# Patient Record
Sex: Male | Born: 1935 | Race: Black or African American | Hispanic: No | Smoking: Former smoker
Health system: Southern US, Community
[De-identification: ages and names within clinical notes are randomized; demographics above are authoritative.]

## PROBLEM LIST (undated history)

## (undated) DIAGNOSIS — E785 Hyperlipidemia, unspecified: Secondary | ICD-10-CM

## (undated) DIAGNOSIS — C61 Malignant neoplasm of prostate: Secondary | ICD-10-CM

## (undated) DIAGNOSIS — G709 Myoneural disorder, unspecified: Secondary | ICD-10-CM

## (undated) DIAGNOSIS — H409 Unspecified glaucoma: Secondary | ICD-10-CM

## (undated) DIAGNOSIS — I10 Essential (primary) hypertension: Secondary | ICD-10-CM

## (undated) DIAGNOSIS — K219 Gastro-esophageal reflux disease without esophagitis: Secondary | ICD-10-CM

## (undated) DIAGNOSIS — Z8619 Personal history of other infectious and parasitic diseases: Secondary | ICD-10-CM

## (undated) DIAGNOSIS — K5790 Diverticulosis of intestine, part unspecified, without perforation or abscess without bleeding: Secondary | ICD-10-CM

## (undated) DIAGNOSIS — M1990 Unspecified osteoarthritis, unspecified site: Secondary | ICD-10-CM

## (undated) HISTORY — PX: JOINT REPLACEMENT: SHX530

## (undated) HISTORY — DX: Unspecified osteoarthritis, unspecified site: M19.90

## (undated) HISTORY — PX: TONSILLECTOMY: SUR1361

## (undated) HISTORY — DX: Myoneural disorder, unspecified: G70.9

## (undated) HISTORY — DX: Hyperlipidemia, unspecified: E78.5

## (undated) HISTORY — DX: Diverticulosis of intestine, part unspecified, without perforation or abscess without bleeding: K57.90

## (undated) HISTORY — PX: COLONOSCOPY: SHX174

---

## 2007-04-12 DIAGNOSIS — K5790 Diverticulosis of intestine, part unspecified, without perforation or abscess without bleeding: Secondary | ICD-10-CM

## 2007-04-12 HISTORY — DX: Diverticulosis of intestine, part unspecified, without perforation or abscess without bleeding: K57.90

## 2008-01-10 ENCOUNTER — Encounter: Payer: Self-pay | Admitting: Internal Medicine

## 2008-01-17 ENCOUNTER — Ambulatory Visit: Payer: Self-pay | Admitting: Internal Medicine

## 2008-01-17 DIAGNOSIS — E7800 Pure hypercholesterolemia, unspecified: Secondary | ICD-10-CM | POA: Insufficient documentation

## 2008-01-17 DIAGNOSIS — M1990 Unspecified osteoarthritis, unspecified site: Secondary | ICD-10-CM | POA: Insufficient documentation

## 2008-01-17 DIAGNOSIS — E1151 Type 2 diabetes mellitus with diabetic peripheral angiopathy without gangrene: Secondary | ICD-10-CM | POA: Insufficient documentation

## 2008-01-17 DIAGNOSIS — R972 Elevated prostate specific antigen [PSA]: Secondary | ICD-10-CM | POA: Insufficient documentation

## 2008-01-17 DIAGNOSIS — I10 Essential (primary) hypertension: Secondary | ICD-10-CM | POA: Insufficient documentation

## 2008-01-17 LAB — CONVERTED CEMR LAB
Alkaline Phosphatase: 65 units/L (ref 39–117)
Bilirubin, Direct: 0.1 mg/dL (ref 0.0–0.3)
CO2: 29 meq/L (ref 19–32)
Crystals: NEGATIVE
Eosinophils Relative: 2.4 % (ref 0.0–5.0)
GFR calc Af Amer: 107 mL/min
Glucose, Bld: 139 mg/dL — ABNORMAL HIGH (ref 70–99)
Microalb Creat Ratio: 5.1 mg/g (ref 0.0–30.0)
Monocytes Relative: 10.9 % (ref 3.0–12.0)
Neutrophils Relative %: 60.4 % (ref 43.0–77.0)
Platelets: 331 10*3/uL (ref 150–400)
Potassium: 4.2 meq/L (ref 3.5–5.1)
Sodium: 140 meq/L (ref 135–145)
Specific Gravity, Urine: >=1.03 (ref 1.000–1.03)
TSH: 1.66 microintl units/mL (ref 0.35–5.50)
Total Protein, Urine: NEGATIVE mg/dL
Total Protein: 6.6 g/dL (ref 6.0–8.3)
Urine Glucose: 100 mg/dL — AB
WBC: 4.5 10*3/uL (ref 4.5–10.5)
pH: 5 (ref 5.0–8.0)

## 2008-01-21 ENCOUNTER — Encounter: Payer: Self-pay | Admitting: Internal Medicine

## 2008-01-24 ENCOUNTER — Encounter: Payer: Self-pay | Admitting: Internal Medicine

## 2008-02-04 ENCOUNTER — Ambulatory Visit: Payer: Self-pay | Admitting: Gastroenterology

## 2008-02-05 ENCOUNTER — Telehealth (INDEPENDENT_AMBULATORY_CARE_PROVIDER_SITE_OTHER): Payer: Self-pay | Admitting: *Deleted

## 2008-03-13 ENCOUNTER — Ambulatory Visit: Payer: Self-pay | Admitting: Gastroenterology

## 2008-03-17 ENCOUNTER — Ambulatory Visit: Payer: Self-pay | Admitting: Gastroenterology

## 2008-05-26 ENCOUNTER — Encounter: Admission: RE | Admit: 2008-05-26 | Discharge: 2008-05-26 | Payer: Self-pay | Admitting: Internal Medicine

## 2008-08-04 ENCOUNTER — Encounter: Payer: Self-pay | Admitting: Internal Medicine

## 2008-09-30 ENCOUNTER — Encounter: Payer: Self-pay | Admitting: Internal Medicine

## 2008-10-07 ENCOUNTER — Encounter: Payer: Self-pay | Admitting: Internal Medicine

## 2009-01-16 ENCOUNTER — Ambulatory Visit: Payer: Self-pay | Admitting: Internal Medicine

## 2009-01-16 DIAGNOSIS — R079 Chest pain, unspecified: Secondary | ICD-10-CM

## 2009-01-16 LAB — CONVERTED CEMR LAB
ALT: 15 units/L (ref 0–53)
Albumin: 4.2 g/dL (ref 3.5–5.2)
Basophils Relative: 0.6 % (ref 0.0–3.0)
Bilirubin Urine: NEGATIVE
CO2: 22 meq/L (ref 19–32)
Chloride: 102 meq/L (ref 96–112)
Cholesterol, target level: 200 mg/dL
Creatinine, Ser: 1 mg/dL (ref 0.4–1.5)
Eosinophils Absolute: 0.2 10*3/uL (ref 0.0–0.7)
HCT: 39 % (ref 39.0–52.0)
Hemoglobin: 13.3 g/dL (ref 13.0–17.0)
Hgb A1c MFr Bld: 6.7 % — ABNORMAL HIGH (ref 4.6–6.5)
Ketones, ur: NEGATIVE mg/dL
LDL Goal: 100 mg/dL
Leukocytes, UA: NEGATIVE
MCHC: 34 g/dL (ref 30.0–36.0)
MCV: 98.3 fL (ref 78.0–100.0)
Monocytes Absolute: 0.4 10*3/uL (ref 0.1–1.0)
Neutro Abs: 1.6 10*3/uL (ref 1.4–7.7)
Nitrite: NEGATIVE
Potassium: 4.8 meq/L (ref 3.5–5.1)
RBC: 3.97 M/uL — ABNORMAL LOW (ref 4.22–5.81)
Sodium: 139 meq/L (ref 135–145)
Total CHOL/HDL Ratio: 4
Total Protein, Urine: NEGATIVE mg/dL
Total Protein: 6.4 g/dL (ref 6.0–8.3)
Triglycerides: 71 mg/dL (ref 0.0–149.0)

## 2009-01-19 ENCOUNTER — Telehealth (INDEPENDENT_AMBULATORY_CARE_PROVIDER_SITE_OTHER): Payer: Self-pay | Admitting: *Deleted

## 2009-01-20 ENCOUNTER — Ambulatory Visit: Payer: Self-pay

## 2009-01-20 ENCOUNTER — Encounter (HOSPITAL_COMMUNITY): Admission: RE | Admit: 2009-01-20 | Discharge: 2009-01-20 | Payer: Self-pay | Admitting: Internal Medicine

## 2009-01-20 ENCOUNTER — Ambulatory Visit: Payer: Self-pay | Admitting: Cardiology

## 2009-01-20 DIAGNOSIS — R9430 Abnormal result of cardiovascular function study, unspecified: Secondary | ICD-10-CM | POA: Insufficient documentation

## 2009-01-21 ENCOUNTER — Telehealth: Payer: Self-pay | Admitting: Internal Medicine

## 2009-01-21 ENCOUNTER — Encounter: Payer: Self-pay | Admitting: Internal Medicine

## 2009-01-27 ENCOUNTER — Ambulatory Visit: Payer: Self-pay | Admitting: Cardiology

## 2009-01-27 DIAGNOSIS — R9431 Abnormal electrocardiogram [ECG] [EKG]: Secondary | ICD-10-CM

## 2009-02-04 ENCOUNTER — Ambulatory Visit: Payer: Self-pay | Admitting: Cardiology

## 2009-02-04 ENCOUNTER — Ambulatory Visit: Payer: Self-pay

## 2009-02-04 ENCOUNTER — Ambulatory Visit (HOSPITAL_COMMUNITY): Admission: RE | Admit: 2009-02-04 | Discharge: 2009-02-04 | Payer: Self-pay | Admitting: Cardiology

## 2009-02-04 ENCOUNTER — Encounter: Payer: Self-pay | Admitting: Cardiology

## 2009-02-06 ENCOUNTER — Telehealth: Payer: Self-pay | Admitting: Internal Medicine

## 2009-04-08 ENCOUNTER — Telehealth: Payer: Self-pay | Admitting: Internal Medicine

## 2009-04-22 ENCOUNTER — Telehealth: Payer: Self-pay | Admitting: Internal Medicine

## 2009-04-23 ENCOUNTER — Telehealth: Payer: Self-pay | Admitting: Internal Medicine

## 2009-05-01 ENCOUNTER — Encounter: Payer: Self-pay | Admitting: Internal Medicine

## 2009-05-05 ENCOUNTER — Telehealth (INDEPENDENT_AMBULATORY_CARE_PROVIDER_SITE_OTHER): Payer: Self-pay | Admitting: *Deleted

## 2009-05-06 ENCOUNTER — Telehealth: Payer: Self-pay | Admitting: Internal Medicine

## 2009-05-07 ENCOUNTER — Encounter: Payer: Self-pay | Admitting: Internal Medicine

## 2009-06-09 ENCOUNTER — Encounter: Payer: Self-pay | Admitting: Internal Medicine

## 2010-02-11 ENCOUNTER — Telehealth: Payer: Self-pay | Admitting: Internal Medicine

## 2010-02-12 ENCOUNTER — Ambulatory Visit: Payer: Self-pay | Admitting: Internal Medicine

## 2010-02-12 LAB — CONVERTED CEMR LAB
ALT: 23 units/L (ref 0–53)
BUN: 11 mg/dL (ref 6–23)
Basophils Absolute: 0 10*3/uL (ref 0.0–0.1)
Bilirubin Urine: NEGATIVE
Bilirubin, Direct: 0 mg/dL (ref 0.0–0.3)
Chloride: 105 meq/L (ref 96–112)
Cholesterol: 117 mg/dL (ref 0–200)
Creatinine, Ser: 0.9 mg/dL (ref 0.4–1.5)
Creatinine,U: 275.2 mg/dL
Eosinophils Absolute: 0.1 10*3/uL (ref 0.0–0.7)
Eosinophils Relative: 2.9 % (ref 0.0–5.0)
Glucose, Bld: 137 mg/dL — ABNORMAL HIGH (ref 70–99)
HCT: 34.5 % — ABNORMAL LOW (ref 39.0–52.0)
Hgb A1c MFr Bld: 6.7 % — ABNORMAL HIGH (ref 4.6–6.5)
Ketones, ur: NEGATIVE mg/dL
LDL Cholesterol: 68 mg/dL (ref 0–99)
Leukocytes, UA: NEGATIVE
Lymphs Abs: 1.5 10*3/uL (ref 0.7–4.0)
MCHC: 33.6 g/dL (ref 30.0–36.0)
MCV: 97.4 fL (ref 78.0–100.0)
Microalb Creat Ratio: 0.5 mg/g (ref 0.0–30.0)
Microalb, Ur: 1.4 mg/dL (ref 0.0–1.9)
Monocytes Absolute: 0.4 10*3/uL (ref 0.1–1.0)
Neutrophils Relative %: 48.8 % (ref 43.0–77.0)
PSA: 3.51 ng/mL (ref 0.10–4.00)
Platelets: 284 10*3/uL (ref 150.0–400.0)
Potassium: 4.4 meq/L (ref 3.5–5.1)
RDW: 14.6 % (ref 11.5–14.6)
Specific Gravity, Urine: >=1.03 (ref 1.000–1.030)
TSH: 1.82 microintl units/mL (ref 0.35–5.50)
Total Bilirubin: 0.8 mg/dL (ref 0.3–1.2)
Triglycerides: 59 mg/dL (ref 0.0–149.0)
Urine Glucose: NEGATIVE mg/dL
Urobilinogen, UA: 0.2 (ref 0.0–1.0)
WBC: 4.2 10*3/uL — ABNORMAL LOW (ref 4.5–10.5)

## 2010-02-18 ENCOUNTER — Ambulatory Visit: Payer: Self-pay | Admitting: Cardiology

## 2010-02-25 ENCOUNTER — Telehealth (INDEPENDENT_AMBULATORY_CARE_PROVIDER_SITE_OTHER): Payer: Self-pay

## 2010-03-01 ENCOUNTER — Encounter: Payer: Self-pay | Admitting: Cardiovascular Disease

## 2010-03-01 ENCOUNTER — Encounter (HOSPITAL_COMMUNITY)
Admission: RE | Admit: 2010-03-01 | Discharge: 2010-05-11 | Payer: Self-pay | Source: Home / Self Care | Attending: Cardiology | Admitting: Cardiology

## 2010-03-01 ENCOUNTER — Ambulatory Visit: Payer: Self-pay | Admitting: Cardiovascular Disease

## 2010-03-01 ENCOUNTER — Ambulatory Visit: Payer: Self-pay

## 2010-03-09 ENCOUNTER — Encounter: Payer: Self-pay | Admitting: Internal Medicine

## 2010-04-02 ENCOUNTER — Encounter: Payer: Self-pay | Admitting: Internal Medicine

## 2010-05-04 ENCOUNTER — Telehealth (INDEPENDENT_AMBULATORY_CARE_PROVIDER_SITE_OTHER): Payer: Self-pay | Admitting: *Deleted

## 2010-05-11 NOTE — Assessment & Plan Note (Signed)
Summary: F1Y/ABN EKG NONSPECIFIC/JML   Visit Type:  Follow-up Primary Provider:  Sanda Linger, MD  CC:  Abnormal Cardiovascular Study.  History of Present Illness: The patient is referred back for evaluation of an abnormal cardiovascular function study. This was done last year to evaluate chest pain in the base of long-standing diabetes. There was a questionable inferoapical infarct with an EF of 47%. Followup echo suggested the EF to be 60% with questionable lateral wall hypokinesis. The patient is referred back to further evaluate this. He does get some dyspnea with exertion such as when he pushes a lawnmower. He will have to stop what he is doing and recover. He does not get any chest pressure, neck or arm discomfort. He doesn't have any resting shortness of breath and does not describe PND or orthopnea. He does not have palpitations, presyncope or syncope.  I did review lipids at his last LDL was 68 HDL 37. His hemoglobin A1c was 6.7.  Current Medications (verified): 1)  Simvastatin 40 Mg Tabs (Simvastatin) .... Once Daily 2)  Glyburide 2.5 Mg Tabs (Glyburide) .... Qdtab 3)  Metformin Hcl 850 Mg Tabs (Metformin Hcl) .... Take 1 Tablet By Mouth Two Times A Day 4)  Celebrex 200 Mg Caps (Celecoxib) .... Once Daily As Needed 5)  Contour Blood Glucose System W/device Kit (Blood Glucose Monitoring Suppl) .... As Directed 6)  Altace 2.5 Mg Tabs (Ramipril) .... Once Daily For Hypertension  Allergies (verified): No Known Drug Allergies  Past History:  Past Medical History: Reviewed history from 01/27/2009 and no changes required. Diabetes mellitus, type II since 1995 Hyperlipidemia x 5 years Osteoarthritis  Past Surgical History: Reviewed history from 01/27/2009 and no changes required. None  Review of Systems       As stated in the HPI and negative for all other systems.   Vital Signs:  Patient profile:   76 year old male Height:      65 inches Weight:      176 pounds BMI:      29.39 Pulse rate:   68 / minute Resp:     18 per minute BP sitting:   158 / 86  (right arm)  Vitals Entered By: Marrion Coy, CNA (February 18, 2010 9:44 AM)  Physical Exam  General:  Well developed, well nourished, in no acute distress. Head:  normocephalic and atraumatic Eyes:  PERRLA/EOM intact; conjunctiva and lids normal. Mouth:  Upper dentures. Oral mucosa normal. Neck:  Neck supple, no JVD. No masses, thyromegaly or abnormal cervical nodes. Chest Wall:  no deformities or breast masses noted Lungs:  Clear bilaterally to auscultation and percussion. Abdomen:  Bowel sounds positive; abdomen soft and non-tender without masses, organomegaly, or hernias noted. No hepatosplenomegaly. Msk:  Back normal, normal gait. Muscle strength and tone normal. Extremities:  No clubbing or cyanosis. Neurologic:  Alert and oriented x 3. Skin:  Intact without lesions or rashes. Cervical Nodes:  no significant adenopathy Axillary Nodes:  no significant adenopathy Inguinal Nodes:  no significant adenopathy Psych:  Normal affect.   Detailed Cardiovascular Exam  Neck    Carotids: Carotids full and equal bilaterally without bruits.      Neck Veins: Normal, no JVD.    Heart    Inspection: no deformities or lifts noted.      Palpation: normal PMI with no thrills palpable.      Auscultation: regular rate and rhythm, S1, S2 without murmurs, rubs, gallops, or clicks.    Vascular    Abdominal Aorta:  no palpable masses, pulsations, or audible bruits.      Femoral Pulses: normal femoral pulses bilaterally.      Pedal Pulses: normal pedal pulses bilaterally.      Radial Pulses: normal radial pulses bilaterally.      Peripheral Circulation: no clubbing, cyanosis, or edema noted with normal capillary refill.     EKG  Procedure date:  02/18/2010  Findings:      sinus rhythm, rate 70, axis within normal limits, intervals within normal limits, premature atrial contraction, no acute ST-T wave  changes.  Impression & Recommendations:  Problem # 1:  CARDIOVASCULAR FUNCTION STUDY, ABNORMAL (ICD-794.30) The patient does have some dyspnea with exertion.  In the face of significant risk factors and a previous abnormal functional study it is prudent to reevaluate again with a stress perfusion study. He will continue primary risk reduction pending these results. 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 He is elevated but previous blood pressure readings have not been. He said he was excited about getting here. This should be followed and meds adjusted as needed but today I did not make any med changes.  Problem # 3:  PURE HYPERCHOLESTEROLEMIA (ICD-272.0) His lipid profile is reasonable and he will continue the meds as listed.  Patient Instructions: 1)  Your physician recommends that you schedule a follow-up appointment after stress testing if needed 2)  Your physician recommends that you continue on your current medications as directed. Please refer to the Current Medication list given to you today. 3)  Your physician has requested that you have an exercise stress myoview.  For further information please visit [email or web address]  Please follow instruction sheet, as given.

## 2010-05-11 NOTE — Assessment & Plan Note (Signed)
Summary: Cardiology Nuclear Testing  Nuclear Med Background Indications for Stress Test: Evaluation for Ischemia  Indications Comments: Evaluation due  to previous abnormal nuclear study.  History: Echo, Myocardial Perfusion Study  History Comments: '10 [id number] infero-apical scar, EF=47%; '10 Echo:EF=60%.  Symptoms: DOE    Nuclear Pre-Procedure Cardiac Risk Factors: Family History - CAD, History of Smoking, Hypertension, Lipids, NIDDM Caffeine/Decaff Intake: none NPO After: 10:00 PM Lungs: Clear IV 0.9% NS with Angio Cath: 22g     IV Site: R Hand IV Started by: Cathlyn Parsons, RN Chest Size (in) 42     Height (in): 65 Weight (lb): 176 BMI: 29.39  Nuclear Med Study 1 or 2 day study:  1 day     Stress Test Type:  Stress Reading MD:  Charlton Haws, MD     Referring MD:  Rollene Rotunda, MD Resting Radionuclide:  Technetium 81m Tetrofosmin     Resting Radionuclide Dose:  11 mCi  Stress Radionuclide:  Technetium 17m Tetrofosmin     Stress Radionuclide Dose:  33 mCi   Stress Protocol Exercise Time (min):  7:00 min     Max HR:  139 bpm     Predicted Max HR:  147 bpm  Max Systolic BP: 217 mm Hg     Percent Max HR:  94.56 %     METS: 7.0 Rate Pressure Product:  56213    Stress Test Technologist:  [employer]a [employer], CMA-N     Nuclear Technologist:  Domenic Polite, CNMT  Rest Procedure  Myocardial perfusion imaging was performed at rest 45 minutes following the intravenous administration of Technetium 24m Tetrofosmin.  Stress Procedure  The patient exercised for seven minutes.  The patient stopped due to fatigue and denied any chest pain.  There were nonspecific ST-T wave changes.  There were also occasional PAC's, PVC's with couplets and a short burst of SVT.   He had a mild hypertensive response to exercise, 217/69.  Technetium 59m Tetrofosmin was injected at peak exercise and myocardial perfusion imaging was performed after a brief delay.  QPS Raw Data Images:  Normal; no  motion artifact; normal heart/lung ratio. Stress Images:  Decrease inferior Rest Images:  Decrease inferior Subtraction (SDS):  SDS 5 abnormal at apex Transient Ischemic Dilatation:  0.92  (Normal <1.22)  Lung/Heart Ratio:  0.38  (Normal <0.45)  Quantitative Gated Spect Images QGS EDV:  124 ml QGS ESV:  55 ml QGS EF:  56 % QGS cine images:  normal  Findings Low risk nuclear study Evidence for anterior (septal apical) ischemia  Evidence for inferior infarct     Overall Impression  Exercise Capacity: Fair exercise capacity. BP Response: Hypertensive blood pressure response. Clinical Symptoms: No chest pain ECG Impression: No significant ST segment change suggestive of ischemia. 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  No further studies indicated.  Continue with current medications.  Appended Document: Cardiology Nuclear Testing pt aware of results

## 2010-05-11 NOTE — Letter (Signed)
Summary: [facility] Diabetic shoes/[facility]  [facility] Diabetic shoes/[facility]   Imported By: Lester [place] 05/05/2009 09:07:36  _____________________________________________________________________  External Attachment:    Type:   Image     Comment:   External Document

## 2010-05-11 NOTE — Letter (Signed)
Summary: [facility] for [facility]abetic Shoes/[facility]  [facility] for [facility]   Imported By: Sherian Rein 03/12/2010 10:19:06  _____________________________________________________________________  External Attachment:    Type:   Image     Comment:   External Document

## 2010-05-11 NOTE — Assessment & Plan Note (Signed)
Summary: FOLLOW-UP-LB   Vital Signs:  Patient profile:   76 year old male Height:      65 inches Weight:      178.75 pounds BMI:     29.85 O2 Sat:      98 % on Room air Temp:     97.9 degrees F oral Pulse rate:   63 / minute Pulse rhythm:   regular Resp:     16 per minute BP sitting:   130 / 70  (left arm) Cuff size:   large  Vitals Entered By: Rock Nephew CMA (February 12, 2010 8:18 AM)  Nutrition Counseling: Patient's BMI is greater than 25 and therefore counseled on weight management options.  O2 Flow:  Room air CC: follow-up visit//med refills, Preventive Care, Hypertension Management, Lipid Management Is Patient Diabetic? Yes Did you bring your meter with you today? No Pain Assessment Patient in pain? no       Does patient need assistance? Functional Status Self care Ambulation Normal   Primary Care Telisha Zawadzki:  Sanda Linger, MD  CC:  follow-up visit//med refills, Preventive Care, Hypertension Management, and Lipid Management.  History of Present Illness: Here for Medicare AWV:  1.   Risk factors based on Past M, S, F history: yes 2.   Physical Activities: very good. he travels a lot. 3.   Depression/mood: mood is very good 4.   Hearing: he hears a whispered voice at 3 feet 5.   ADL's: thorough and independent 6.   Fall Risk: none noted 7.   Home Safety: very good 8.   Height, weight, &visual acuity: done 9.   Counseling: yes 10.   Labs ordered based on risk factors: done 11.           Referral Coordination: done 12.           Care Plan: completed 13.           Cognitive Assessment : he answers all questions appropriately  Hypertension History:      He denies headache, chest pain, palpitations, dyspnea with exertion, orthopnea, PND, peripheral edema, visual symptoms, neurologic problems, syncope, and side effects from treatment.  He notes no problems with any antihypertensive medication side effects.        Positive major cardiovascular risk factors  include male age 75 years old or older, diabetes, hyperlipidemia, and hypertension.  Negative major cardiovascular risk factors include negative family history for ischemic heart disease and non-tobacco-user status.        Further assessment for target organ damage reveals no history of ASHD, cardiac end-organ damage (CHF/LVH), stroke/TIA, peripheral vascular disease, renal insufficiency, or hypertensive retinopathy.    Lipid Management History:      Positive NCEP/ATP III risk factors include male age 52 years old or older, diabetes, HDL cholesterol less than 40, and hypertension.  Negative NCEP/ATP III risk factors include no family history for ischemic heart disease, non-tobacco-user status, no ASHD (atherosclerotic heart disease), no prior stroke/TIA, no peripheral vascular disease, and no history of aortic aneurysm.        The patient states that he knows about the "Therapeutic Lifestyle Change" diet.  His compliance with the TLC diet is fair.  The patient expresses understanding of adjunctive measures for cholesterol lowering.  Adjunctive measures started by the patient include aerobic exercise, fiber, limit alcohol consumpton, and weight reduction.  He expresses no side effects from his lipid-lowering medication.  The patient denies any symptoms to suggest myopathy or liver disease.  Current Medications (verified): 1)  Simvastatin 40 Mg Tabs (Simvastatin) .... Once Daily 2)  Glyburide 2.5 Mg Tabs (Glyburide) .... Qdtab 3)  Metformin Hcl 850 Mg Tabs (Metformin Hcl) .... Take 1 Tablet By Mouth Two Times A Day 4)  Avandia 8 Mg Tabs (Rosiglitazone Maleate) .... Take 1 Tablet By Mouth Once A Day 5)  Celebrex 200 Mg Caps (Celecoxib) .... Once Daily As Needed 6)  Contour Blood Glucose System W/device Kit (Blood Glucose Monitoring Suppl) .... As Directed 7)  Altace 2.5 Mg Tabs (Ramipril) .... Once Daily For Hypertension  Allergies (verified): No Known Drug Allergies  Past History:  Past  Medical History: Last updated: 01/27/2009 Diabetes mellitus, type II since 1995 Hyperlipidemia x 5 years Osteoarthritis  Past Surgical History: Last updated: 01/27/2009 None  Family History: Last updated: 01/27/2009 Family History of Arthritis Family History Diabetes 1st degree relative Family History of Prostate CA 1st degree relative <50 No FH of Colon Cancer: One brother with questionable heart disease  Social History: Last updated: 01/27/2009 Retired Widower Smoked 4 -5 years (cigars) quit 15 years ago Alcohol use-no Drug use-no Regular exercise-yes Daily Caffeine Use  Risk Factors: Caffeine Use: 2 (02/04/2008) Exercise: yes (01/17/2008)  Risk Factors: Smoking Status: never (01/17/2008) Passive Smoke Exposure: no (01/17/2008)  Family History: Reviewed history from 01/27/2009 and no changes required. Family History of Arthritis Family History Diabetes 1st degree relative Family History of Prostate CA 1st degree relative <50 No FH of Colon Cancer: One brother with questionable heart disease  Social History: Reviewed history from 01/27/2009 and no changes required. Retired Widower Smoked 4 -5 years (cigars) quit 15 years ago Alcohol use-no Drug use-no Regular exercise-yes Daily Caffeine Use  Review of Systems  The patient denies anorexia, fever, weight loss, weight gain, chest pain, syncope, dyspnea on exertion, peripheral edema, prolonged cough, headaches, hemoptysis, abdominal pain, melena, hematochezia, severe indigestion/heartburn, hematuria, suspicious skin lesions, transient blindness, enlarged lymph nodes, angioedema, and testicular masses.   GU:  Complains of urinary frequency; denies decreased libido, discharge, dysuria, erectile dysfunction, hematuria, incontinence, nocturia, and urinary hesitancy. Endo:  Denies cold intolerance, excessive hunger, excessive thirst, excessive urination, heat intolerance, polyuria, and weight change.  Physical  Exam  General:  Well developed, well nourished, no acute distress.healthy appearing.   Head:  normocephalic and atraumatic.   Eyes:  No icterus Mouth:  good dentition and no gingival abnormalities.   Neck:  supple, full ROM, no masses, no carotid bruits, no cervical lymphadenopathy, and no neck tenderness.   Lungs:  Clear throughout to auscultation. Heart:  Regular rate and rhythm; no murmurs, rubs,  or bruits. Abdomen:  soft, non-tender, normal bowel sounds, no distention, no masses, no guarding, no rigidity, no rebound tenderness, no abdominal hernia, no inguinal hernia, no hepatomegaly, and no splenomegaly.   Rectal:  No external abnormalities noted. Normal sphincter tone. No rectal masses or tenderness. Genitalia:  uncircumcised, no hydrocele, no varicocele, no scrotal masses, no testicular masses or atrophy, no cutaneous lesions, and no urethral discharge.   Prostate:  no nodules, no induration, 2+ enlarged, and R asymmetrical enlargement.   Msk:  normal ROM, no joint tenderness, no joint swelling, no joint warmth, no redness over joints, no joint deformities, no joint instability, no crepitation, and no muscle atrophy.   Pulses:  R and L carotid,radial,femoral,dorsalis pedis and posterior tibial pulses are full and equal bilaterally Extremities:  No clubbing, cyanosis, edema, or deformity noted with normal full range of motion of all joints.   Neurologic:  No cranial nerve deficits noted. Station and gait are normal. Plantar reflexes are down-going bilaterally. DTRs are symmetrical throughout. Sensory, motor and coordinative functions appear intact.  Diabetes Management Exam:    Foot Exam (with socks and/or shoes not present):       Sensory-Pinprick/Light touch:          Left medial foot (L-4): normal          Left dorsal foot (L-5): normal          Left lateral foot (S-1): normal          Right medial foot (L-4): normal          Right dorsal foot (L-5): normal          Right lateral  foot (S-1): normal       Sensory-Monofilament:          Left foot: normal          Right foot: normal       Inspection:          Left foot: normal          Right foot: normal       Nails:          Left foot: normal          Right foot: normal   Impression & Recommendations:  Problem # 1:  ROUTINE GENERAL MEDICAL EXAM@HEALTH  CARE FACL (ICD-V70.0) Assessment New  Colonoscopy: Location:  [facility].   (03/17/2008) Td Booster: Td (10/16/2007)   Flu Vax: Fluvax MCR (01/27/2010)   Pneumovax: Pneumovax (12/19/2007) Chol: 164 (01/16/2009)   HDL: 38.40 (01/16/2009)   LDL: 111 (01/16/2009)   TG: 71.0 (01/16/2009) TSH: 2.24 (01/16/2009)   HgbA1C: 6.7 (01/16/2009)   Next Colonoscopy due:: 03/2018 (03/17/2008)  Discussed using sunscreen, use of alcohol, drug use, self testicular exam, routine dental care, routine eye care, routine physical exam, seat belts, multiple vitamins, osteoporosis prevention, adequate calcium intake in diet, and recommendations for immunizations.  Discussed exercise and checking cholesterol.  Also recommend checking PSA.  Orders: [facility] -Subsequent Annual Wellness Visit [phone]) DRE (W0981) Prostate / PSA (Medicare) (G0103) Hemoccult Guaiac-1 spec.(in office) (82270)  Problem # 2:  PURE HYPERCHOLESTEROLEMIA (ICD-272.0) Assessment: Unchanged  His updated medication list for this problem includes:    Simvastatin 40 Mg Tabs (Simvastatin) ..... Once daily  Orders: Venipuncture (19147) TLB-Lipid Panel (80061-LIPID) TLB-BMP (Basic Metabolic Panel-BMET) (80048-METABOL) TLB-CBC Platelet - w/Differential (85025-CBCD) TLB-Hepatic/Liver Function Pnl (80076-HEPATIC) TLB-TSH (Thyroid Stimulating Hormone) (84443-TSH) TLB-A1C / Hgb A1C (Glycohemoglobin) (83036-A1C) TLB-Microalbumin/Creat Ratio, Urine (82043-MALB) TLB-PSA (Prostate Specific Antigen) (84153-PSA) TLB-Udip w/ Micro (81001-URINE)  Labs Reviewed: SGOT: 16 (01/16/2009)   SGPT: 15 (01/16/2009)  Lipid  Goals: Chol Goal: 200 (01/16/2009)   HDL Goal: 40 (01/16/2009)   LDL Goal: 100 (01/16/2009)   TG Goal: 150 (01/16/2009)  10 Yr Risk Heart Disease: 40 % Prior 10 Yr Risk Heart Disease: Not enough information (01/17/2008)   HDL:38.40 (01/16/2009)  LDL:111 (01/16/2009)  Chol:164 (01/16/2009)  Trig:71.0 (01/16/2009)  Problem # 3:  DIAB W/RENAL MANIFESTS TYPE II/UNS TYPE UNCNTRL (ICD-250.42) Assessment: Unchanged  The following medications were removed from the medication list:    Avandia 8 Mg Tabs (Rosiglitazone maleate) .Marland Kitchen... Take 1 tablet by mouth once a day His updated medication list for this problem includes:    Glyburide 2.5 Mg Tabs (Glyburide) ..... Qdtab    Metformin Hcl 850 Mg Tabs (Metformin hcl) .Marland Kitchen... Take 1 tablet by mouth two times a day    Altace  2.5 Mg Tabs (Ramipril) ..... Once daily for hypertension  Orders: Venipuncture (72536) TLB-Lipid Panel (80061-LIPID) TLB-BMP (Basic Metabolic Panel-BMET) (80048-METABOL) TLB-CBC Platelet - w/Differential (85025-CBCD) TLB-Hepatic/Liver Function Pnl (80076-HEPATIC) TLB-TSH (Thyroid Stimulating Hormone) (84443-TSH) TLB-A1C / Hgb A1C (Glycohemoglobin) (83036-A1C) TLB-Microalbumin/Creat Ratio, Urine (82043-MALB) TLB-PSA (Prostate Specific Antigen) (84153-PSA) TLB-Udip w/ Micro (81001-URINE)  Labs Reviewed: Creat: 1.0 (01/16/2009)     Last Eye Exam: normal (02/05/2008) Reviewed HgBA1c results: 6.7 (01/16/2009)  6.6 (01/17/2008)  Problem # 4:  ELEVATED PROSTATE SPECIFIC ANTIGEN (ICD-790.93) Assessment: Unchanged  Orders: Venipuncture (64403) TLB-Lipid Panel (80061-LIPID) TLB-BMP (Basic Metabolic Panel-BMET) (80048-METABOL) TLB-CBC Platelet - w/Differential (85025-CBCD) TLB-Hepatic/Liver Function Pnl (80076-HEPATIC) TLB-TSH (Thyroid Stimulating Hormone) (84443-TSH) TLB-A1C / Hgb A1C (Glycohemoglobin) (83036-A1C) TLB-Microalbumin/Creat Ratio, Urine (82043-MALB) TLB-PSA (Prostate Specific Antigen) (84153-PSA) TLB-Udip w/  Micro (81001-URINE) DRE (K7425) Prostate / PSA (Medicare) (G0103)  Problem # 5:  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 Once daily for hypertension  Orders: Venipuncture (95638) TLB-Lipid Panel (80061-LIPID) TLB-BMP (Basic Metabolic Panel-BMET) (80048-METABOL) TLB-CBC Platelet - w/Differential (85025-CBCD) TLB-Hepatic/Liver Function Pnl (80076-HEPATIC) TLB-TSH (Thyroid Stimulating Hormone) (84443-TSH) TLB-A1C / Hgb A1C (Glycohemoglobin) (83036-A1C) TLB-Microalbumin/Creat Ratio, Urine (82043-MALB) TLB-PSA (Prostate Specific Antigen) (84153-PSA) TLB-Udip w/ Micro (81001-URINE)  BP today: 130/70 Prior BP: 125/71 (01/27/2009)  10 Yr Risk Heart Disease: 40 % Prior 10 Yr Risk Heart Disease: Not enough information (01/17/2008)  Labs Reviewed: K+: 4.8 (01/16/2009) Creat: : 1.0 (01/16/2009)   Chol: 164 (01/16/2009)   HDL: 38.40 (01/16/2009)   LDL: 111 (01/16/2009)   TG: 71.0 (01/16/2009)  Complete Medication List: 1)  Simvastatin 40 Mg Tabs (Simvastatin) .... Once daily 2)  Glyburide 2.5 Mg Tabs (Glyburide) .... Qdtab 3)  Metformin Hcl 850 Mg Tabs (Metformin hcl) .... Take 1 tablet by mouth two times a day 4)  Celebrex 200 Mg Caps (Celecoxib) .... Once daily as needed 5)  Contour Blood Glucose System W/device Kit (Blood glucose monitoring suppl) .... As directed 6)  Altace 2.5 Mg Tabs (Ramipril) .... Once daily for hypertension  Other Orders: Cardiology Referral (Cardiology)  Hypertension Assessment/Plan:      The patient's hypertensive risk group is category C: Target organ damage and/or diabetes.  His calculated 10 year risk of coronary heart disease is 40 %.  Today's blood pressure is 130/70.  His blood pressure goal is < 130/80.  Lipid Assessment/Plan:      Based on NCEP/ATP III, the patient's risk factor category is "history of diabetes".  The  patient's lipid goals are as follows: Total cholesterol goal is 200; LDL cholesterol goal is 100; HDL cholesterol goal is 40; Triglyceride goal is 150.    Colorectal Screening:  Current Recommendations:    Hemoccult: NEG X 1 today    Colonoscopy recommended: patient defers today but will consider in the future  PSA Screening:    Reviewed PSA screening recommendations: PSA ordered  Immunization & Chemoprophylaxis:    Tetanus vaccine: Td  (10/16/2007)    Influenza vaccine: Fluvax MCR  (01/27/2010)    Pneumovax: Pneumovax  (12/19/2007)  Patient Instructions: 1)  Please schedule a follow-up appointment in 3 months. 2)  It is important that you exercise regularly at least 20 minutes 5 times a week. If you develop chest pain, have severe difficulty breathing, or feel very tired , stop exercising immediately and seek medical attention. 3)  You need to lose weight. Consider a lower calorie diet and regular exercise.  4)  Check your blood sugars regularly. If your readings are usually above 200 or below 70 you should contact our office. 5)  It is important that your Diabetic A1c level is checked every 3 months. 6)  See your eye doctor yearly to check for diabetic eye damage. 7)  Check your feet each night for sore areas, calluses or signs of infection. 8)  Check your Blood Pressure regularly. If it is above 130/80: you should make an appointment. 9)  Take an Aspirin every day. Prescriptions: ALTACE 2.5 MG TABS (RAMIPRIL) once daily for hypertension  #30 Capsule x 11   Entered and Authorized by:   Etta Grandchild MD   Signed by:   Etta Grandchild MD on 02/12/2010   Method used:   Electronically to        [facility] [address] [phone]* (retail)       [address]       [place], [place]  [address]       Ph: [phone]       Fax: [phone]   RxID:   [id number] METFORMIN HCL 850 MG TABS (METFORMIN HCL) Take 1 tablet by mouth two times a day  #60 Tablet x 11   Entered and Authorized by:    Etta Grandchild MD   Signed by:   Etta Grandchild MD on 02/12/2010   Method used:   Electronically to        [facility] [phone]* (retail)       [address]       [place], [place]  [address]       Ph: [phone]       Fax: [phone]   RxID:   [id number] GLYBURIDE 2.5 MG TABS (GLYBURIDE) qdtab  #30 Tablet x 11   Entered and Authorized by:   Etta Grandchild MD   Signed by:   Etta Grandchild MD on 02/12/2010   Method used:   Electronically to        [facility] [address] [phone]* (retail)       [address]       [place], [place]  [address]       Ph: [phone]       Fax: [phone]   RxID:   [id number] SIMVASTATIN 40 MG TABS (SIMVASTATIN) once daily  #30 Tablet x 11   Entered and Authorized by:   Etta Grandchild MD   Signed by:   Etta Grandchild MD on 02/12/2010   Method used:   Electronically to        [facility] [phone]* (retail)       [address]       [place], [place]  [address]       Ph: [phone]       Fax: [phone]   RxID:   [id number]    Orders Added: 1)  Venipuncture [36144] 2)  TLB-Lipid Panel [80061-LIPID] 3)  TLB-BMP (Basic Metabolic Panel-BMET) [80048-METABOL] 4)  TLB-CBC Platelet - w/Differential [85025-CBCD] 5)  TLB-Hepatic/Liver Function Pnl [80076-HEPATIC] 6)  TLB-TSH (Thyroid Stimulating Hormone) [84443-TSH] 7)  TLB-A1C / Hgb A1C (Glycohemoglobin) [83036-A1C] 8)  TLB-Microalbumin/Creat Ratio, Urine [82043-MALB] 9)  TLB-PSA (Prostate Specific Antigen) [84153-PSA] 10)  TLB-Udip w/ Micro [81001-URINE] 11)  Cardiology Referral [Cardiology] 12)  [facility] -Subsequent Annual Wellness Visit [G0439] 13)  DRE [G0102] 14)  Prostate / PSA (Medicare) [G0103] 15)  Hemoccult Guaiac-1 spec.(in office) [82270] 16)  Est. Patient  Level IV [16109]   Immunization History:  Influenza Immunization History:    Influenza:  fluvax mcr (01/27/2010)   Immunization History:  Influenza Immunization History:    Influenza:  Fluvax MCR  (01/27/2010)

## 2010-05-11 NOTE — Progress Notes (Signed)
Summary: Record request  Request for records received from [facility]. Request forwarded to [employer]. Wilder Glade  May 05, 2009 4:26 PM

## 2010-05-11 NOTE — Medication Information (Signed)
Summary: Diabetic supplies/[facility]  Diabetic supplies/[facility]   Imported By: Lester [place] 05/05/2009 09:04:42  _____________________________________________________________________  External Attachment:    Type:   Image     Comment:   External Document

## 2010-05-11 NOTE — Progress Notes (Signed)
  Phone Note Outgoing Call   Summary of Call: please don't fill scripts for him, I have not seen him in a LONG time Initial call taken by: Etta Grandchild MD,  February 11, 2010 10:29 AM  Follow-up for Phone Call        pharmacy notified not to fill via telelphone.Alvy Beal Archie CMA  February 11, 2010 10:48 AM

## 2010-05-11 NOTE — Progress Notes (Signed)
Summary: diabetic shoes  Phone Note Other Incoming   Caller: Nicole Cella w/[facility] [phone] Summary of Call: [facility] called stating that the received CMN for this pt's diabetic shoes. Form did not have any conditions marked but on the 2009 form that they received, it had peripheral neuropathy checked which qualified the pt. Please advise if this should be checked, if so they have faxed over a new form to be signed Initial call taken by: Rock Nephew CMA,  May 06, 2009 3:03 PM  Follow-up for Phone Call        ok Follow-up by: Etta Grandchild MD,  May 06, 2009 3:04 PM  Additional Follow-up for Phone Call Additional follow up Details #1::        Form completed and faxed backArchie CMA  May 07, 2009 10:09 AM

## 2010-05-11 NOTE — Medication Information (Signed)
Summary: [facility]   Imported By: Lester Goodman 06/10/2009 11:44:34  _____________________________________________________________________  External Attachment:    Type:   Image     Comment:   External Document

## 2010-05-11 NOTE — Letter (Signed)
Summary: CMn for Diabetic Shoes/[facility]  CMn for Diabetic Shoes/[facility]   Imported By: Sherian Rein 05/11/2009 13:05:20  _____________________________________________________________________  External Attachment:    Type:   Image     Comment:   External Document

## 2010-05-11 NOTE — Progress Notes (Signed)
  Phone Note Call from Patient Call back at Home Phone [phone]   Caller: Patient Call For: Etta Grandchild MD Summary of Call: Pt called and stated she wants Dr Yetta Barre and office to know that [facility] is her carrier for supplies, FYI. When we receive please fill, per pt. Initial call taken by: Verdell Face,  April 23, 2009 11:14 AM  Follow-up for Phone Call        ok Follow-up by: Etta Grandchild MD,  April 23, 2009 11:39 AM

## 2010-05-11 NOTE — Progress Notes (Signed)
Summary: Nuc.Pre-Procedure  Phone Note Outgoing Call Call back at [facility] Phone [phone]   Call placed by: Irean Hong, RN,  February 25, 2010 1:55 PM Summary of Call: Reviewed information on [employer] Information Sheet (see scanned document for further details).  Spoke with pateint.     Nuclear Med Background Indications for Stress Test: Evaluation for Ischemia  Indications Comments: Evaluation due  to previous abnormal nuclear study.  History: Echo, Myocardial Perfusion Study  History Comments: NO DOCUMENTED CAD 01/20/09 [id number] inf-apical scar,EF=47%. 02/04/09 Echo:EF=60%.  Symptoms: DOE    Nuclear Pre-Procedure Cardiac Risk Factors: Family History - CAD, History of Smoking, Hypertension, Lipids, NIDDM Height (in): 65  Nuclear Med Study Referring MD:  Sanda Linger, MD

## 2010-05-11 NOTE — Progress Notes (Signed)
  Phone Note Other Incoming   Caller: [facility] - [facility] Summary of Call: spoke with debra at [facility] and she stated that pt has been getting diabetic supplies from them for 2 years. they are resending a fax for supplies stating that pt needs supplies because he was sch to get them on Jan 4,2011 Initial call taken by: Ami Bullins CMA,  April 22, 2009 10:51 AM  Follow-up for Phone Call        closing phone note until form received Follow-up by: Rock Nephew CMA,  April 22, 2009 11:52 AM

## 2010-05-13 NOTE — Progress Notes (Signed)
  Phone Note Other Incoming   Request: Send information Summary of Call: Request for records received from [employer], [facility]. Request forwarded to [employer].

## 2010-05-13 NOTE — Letter (Signed)
Summary: Shade Flood MD  Shade Flood MD   Imported By: Lennie Odor 05/05/2010 17:30:03  _____________________________________________________________________  External Attachment:    Type:   Image     Comment:   External Document

## 2010-08-12 ENCOUNTER — Telehealth: Payer: Self-pay | Admitting: Internal Medicine

## 2010-08-13 NOTE — Telephone Encounter (Signed)
Patient notified and appt set up to see MD

## 2010-08-16 ENCOUNTER — Encounter: Payer: Self-pay | Admitting: Internal Medicine

## 2010-08-17 ENCOUNTER — Other Ambulatory Visit (INDEPENDENT_AMBULATORY_CARE_PROVIDER_SITE_OTHER): Payer: Medicare Other

## 2010-08-17 ENCOUNTER — Ambulatory Visit (INDEPENDENT_AMBULATORY_CARE_PROVIDER_SITE_OTHER): Payer: Medicare Other | Admitting: Internal Medicine

## 2010-08-17 ENCOUNTER — Encounter: Payer: Self-pay | Admitting: Internal Medicine

## 2010-08-17 DIAGNOSIS — E7800 Pure hypercholesterolemia, unspecified: Secondary | ICD-10-CM

## 2010-08-17 DIAGNOSIS — E1129 Type 2 diabetes mellitus with other diabetic kidney complication: Secondary | ICD-10-CM

## 2010-08-17 DIAGNOSIS — R972 Elevated prostate specific antigen [PSA]: Secondary | ICD-10-CM

## 2010-08-17 DIAGNOSIS — I1310 Hypertensive heart and chronic kidney disease without heart failure, with stage 1 through stage 4 chronic kidney disease, or unspecified chronic kidney disease: Secondary | ICD-10-CM

## 2010-08-17 DIAGNOSIS — E1165 Type 2 diabetes mellitus with hyperglycemia: Secondary | ICD-10-CM

## 2010-08-17 LAB — URINALYSIS, ROUTINE W REFLEX MICROSCOPIC
Bilirubin Urine: NEGATIVE
Nitrite: NEGATIVE
Total Protein, Urine: NEGATIVE
Urobilinogen, UA: 1 (ref 0.0–1.0)

## 2010-08-17 LAB — LIPID PANEL
Cholesterol: 129 mg/dL (ref 0–200)
HDL: 41.3 mg/dL (ref >39.00)
LDL Cholesterol: 76 mg/dL (ref 0–99)
VLDL: 11.8 mg/dL (ref 0.0–40.0)

## 2010-08-17 LAB — COMPREHENSIVE METABOLIC PANEL
ALT: 18 U/L (ref 0–53)
AST: 17 U/L (ref 0–37)
Albumin: 3.9 g/dL (ref 3.5–5.2)
Alkaline Phosphatase: 56 U/L (ref 39–117)
Potassium: 4.7 mEq/L (ref 3.5–5.1)
Sodium: 142 mEq/L (ref 135–145)
Total Bilirubin: 0.8 mg/dL (ref 0.3–1.2)
Total Protein: 6.5 g/dL (ref 6.0–8.3)

## 2010-08-17 LAB — CBC WITH DIFFERENTIAL/PLATELET
Basophils Absolute: 0 10*3/uL (ref 0.0–0.1)
Eosinophils Absolute: 0.1 10*3/uL (ref 0.0–0.7)
Lymphocytes Relative: 29.8 % (ref 12.0–46.0)
MCHC: 34 g/dL (ref 30.0–36.0)
MCV: 98.1 fl (ref 78.0–100.0)
Monocytes Absolute: 0.4 10*3/uL (ref 0.1–1.0)
Neutrophils Relative %: 58.8 % (ref 43.0–77.0)
Platelets: 283 10*3/uL (ref 150.0–400.0)
RBC: 3.78 Mil/uL — ABNORMAL LOW (ref 4.22–5.81)
WBC: 4.2 10*3/uL — ABNORMAL LOW (ref 4.5–10.5)

## 2010-08-17 LAB — TSH: TSH: 1.89 u[IU]/mL (ref 0.35–5.50)

## 2010-08-17 LAB — HEMOGLOBIN A1C: Hgb A1c MFr Bld: 6.4 % (ref 4.6–6.5)

## 2010-08-17 LAB — PSA: PSA: 3.27 ng/mL (ref 0.10–4.00)

## 2010-08-17 MED ORDER — PIOGLITAZONE HCL-METFORMIN HCL 15-850 MG PO TABS: 1.0000 | ORAL_TABLET | Freq: Two times a day (BID) | ORAL | Status: DC

## 2010-08-17 NOTE — Patient Instructions (Signed)
Diabetes, Type 2 Diabetes is a lasting (chronic) disease. In type 2 diabetes, the pancreas does not make enough insulin (a hormone), and the body does not respond normally to the insulin that is made. This type of diabetes was also previously called adult onset diabetes. About 90% of all those who have diabetes have type 2. It usually occurs after the age of 40 but can occur at any age. CAUSES Unlike type 1 diabetes, which happens because insulin is no longer being made, type 2 diabetes happens because the body is making less insulin and has trouble using the insulin properly. SYMPTOMS  Drinking more than usual.   Urinating more than usual.   Blurred vision.   Dry, itchy skin.   Frequent infection like yeast infections in women.   More tired than usual (fatigue).  TREATMENT  Healthy eating.   Exercise.   Medication, if needed.   Monitoring blood glucose (sugar).   Seeing your caregiver regularly.  HOME CARE INSTRUCTIONS  Check your blood glucose (sugar) at least once daily. More frequent monitoring may be necessary, depending on your medications and on how well your diabetes is controlled. Your caregiver will advise you.   Take your medicine as directed by your caregiver.   Do not smoke.   Make wise food choices. Ask your caregiver for information. Weight loss can improve your diabetes.   Learn about low blood glucose (hypoglycemia) and how to treat it.   Get your eyes checked regularly.   Have a yearly physical exam. Have your blood pressure checked. Get your blood and urine tested.   Wear a pendant or bracelet saying that you have diabetes.   Check your feet every night for sores. Let your caregiver know if you have sores that are not healing.  SEEK MEDICAL CARE IF:  You are having problems keeping your blood glucose at target range.   You feel you might be having problems with your medicines.   You have symptoms of an illness that is not improving after 24  hours.   You have a sore or wound that is not healing.   You notice a change in vision or a new problem with your vision.   You develop a fever of more than 100.5.  Document Released: 03/28/2005 Document Re-Released: 04/19/2009 [employer] Patient Information 2011 [employer]. 

## 2010-08-17 NOTE — Assessment & Plan Note (Signed)
He has no s/s, today I will check his PSA and UA and follow from there

## 2010-08-17 NOTE — Assessment & Plan Note (Signed)
He is doing well on simvastatin 

## 2010-08-17 NOTE — Progress Notes (Signed)
Subjective:    Patient ID: [name] [name], male    DOB: [birth year], 76 y.o.   MRN: [id number]  Hypertension This is a chronic problem. The current episode started more than 1 year ago. The problem has been gradually improving since onset. The problem is controlled. Pertinent negatives include no anxiety, blurred vision, chest pain, headaches, malaise/fatigue, neck pain, orthopnea, palpitations, peripheral edema, PND, shortness of breath or sweats. There are no associated agents to hypertension. Past treatments include ACE inhibitors. The current treatment provides moderate improvement. Compliance problems include exercise and diet.   Diabetes He presents for his follow-up diabetic visit. He has type 2 diabetes mellitus. No MedicAlert identification noted. His disease course has been improving. Pertinent negatives for hypoglycemia include no headaches or sweats. Pertinent negatives for diabetes include no blurred vision, no chest pain, no fatigue, no foot paresthesias, no foot ulcerations, no polydipsia, no polyphagia, no polyuria, no visual change, no weakness and no weight loss. Pertinent negatives for hypoglycemia complications include no blackouts, no hospitalization, no nocturnal hypoglycemia, no required assistance and no required glucagon injection. Symptoms are stable. There are no diabetic complications. Current diabetic treatment includes oral agent (dual therapy). He is compliant with treatment all of the time. His weight is stable. He is following a generally healthy diet. Meal planning includes avoidance of concentrated sweets. He has not had a previous visit with a dietician. He participates in exercise intermittently. There is no change in his home blood glucose trend. His breakfast blood glucose range is generally 90-110 mg/dl. His lunch blood glucose range is generally 90-110 mg/dl. His dinner blood glucose range is generally 90-110 mg/dl. His highest blood glucose is 90-110 mg/dl. His overall  blood glucose range is 90-110 mg/dl. He does not see a podiatrist.Eye exam is current.      Review of Systems  Constitutional: Negative for weight loss, malaise/fatigue and fatigue.  HENT: Negative for neck pain.   Eyes: Negative for blurred vision.  Respiratory: Negative for shortness of breath.   Cardiovascular: Negative for chest pain, palpitations, orthopnea and PND.  Genitourinary: Negative for polyuria.  Neurological: Negative for weakness and headaches.  Hematological: Negative for polydipsia and polyphagia.       Objective:   Physical Exam  [vitalsreviewed. Constitutional: He is oriented to person, place, and time. He appears well-developed and well-nourished. No distress.  HENT:  Head: Normocephalic and atraumatic.  Right Ear: External ear normal.  Left Ear: External ear normal.  Nose: Nose normal.  Mouth/Throat: Oropharynx is clear and moist. No oropharyngeal exudate.  Eyes: Conjunctivae and EOM are normal. Pupils are equal, round, and reactive to light. Right eye exhibits no discharge. Left eye exhibits no discharge. No scleral icterus.  Neck: Normal range of motion. Neck supple. No JVD present. No tracheal deviation present. No thyromegaly present.  Cardiovascular: Normal rate, regular rhythm, normal heart sounds and intact distal pulses.  Exam reveals no gallop and no friction rub.   No murmur heard. Pulmonary/Chest: Breath sounds normal. No stridor. No respiratory distress. He has no wheezes. He has no rales. He exhibits no tenderness.  Abdominal: Soft. Bowel sounds are normal. He exhibits no distension and no mass. There is no tenderness. There is no rebound and no guarding.  Musculoskeletal: Normal range of motion. He exhibits no edema and no tenderness.  Lymphadenopathy:    He has no cervical adenopathy.  Neurological: He is alert and oriented to person, place, and time. He has normal reflexes. He displays normal reflexes. No cranial nerve deficit.  He exhibits  normal muscle tone. Coordination normal.  Skin: Skin is warm and dry. No rash noted. He is not diaphoretic. No erythema. No pallor.  Psychiatric: He has a normal mood and affect. His behavior is normal. Judgment and thought content normal.       Lab Results  Component Value Date   WBC 4.2* 02/12/2010   HGB 11.6* 02/12/2010   HCT 34.5* 02/12/2010   PLT 284.0 02/12/2010   CHOL 117 02/12/2010   TRIG 59.0 02/12/2010   HDL 37.50* 02/12/2010   ALT 23 02/12/2010   AST 21 02/12/2010   NA 140 02/12/2010   K 4.4 02/12/2010   CL 105 02/12/2010   CREATININE 0.9 02/12/2010   BUN 11 02/12/2010   CO2 28 02/12/2010   TSH 1.82 02/12/2010   PSA 3.51 02/12/2010   HGBA1C 6.7* 02/12/2010   MICROALBUR 1.4 02/12/2010     Assessment & Plan:

## 2010-08-17 NOTE — Assessment & Plan Note (Signed)
His insurance has asked that I stop Diabeta due to concern about low blood sugars (which he does not report any trouble with.) Accordingly, I changed his regimen to ActoPlusMet. Today I will monitor his A1C, lytes, and renal function.

## 2010-08-17 NOTE — Assessment & Plan Note (Signed)
His BP is well controlled, I will monitor his lytes and renal function 

## 2010-09-08 ENCOUNTER — Ambulatory Visit
Admission: RE | Admit: 2010-09-08 | Discharge: 2010-09-08 | Disposition: A | Discharge status: Home / Self Care | Payer: Medicare Other | Source: Ambulatory Visit | Attending: Radiation Oncology | Admitting: Radiation Oncology

## 2010-09-08 DIAGNOSIS — I10 Essential (primary) hypertension: Secondary | ICD-10-CM | POA: Insufficient documentation

## 2010-09-08 DIAGNOSIS — C61 Malignant neoplasm of prostate: Secondary | ICD-10-CM | POA: Insufficient documentation

## 2010-09-08 DIAGNOSIS — N529 Male erectile dysfunction, unspecified: Secondary | ICD-10-CM | POA: Insufficient documentation

## 2010-09-08 DIAGNOSIS — Z79899 Other long term (current) drug therapy: Secondary | ICD-10-CM | POA: Insufficient documentation

## 2010-09-08 DIAGNOSIS — Z8042 Family history of malignant neoplasm of prostate: Secondary | ICD-10-CM | POA: Insufficient documentation

## 2010-09-08 DIAGNOSIS — E7800 Pure hypercholesterolemia, unspecified: Secondary | ICD-10-CM | POA: Insufficient documentation

## 2010-09-08 DIAGNOSIS — E119 Type 2 diabetes mellitus without complications: Secondary | ICD-10-CM | POA: Insufficient documentation

## 2010-09-08 DIAGNOSIS — M129 Arthropathy, unspecified: Secondary | ICD-10-CM | POA: Insufficient documentation

## 2010-11-08 ENCOUNTER — Telehealth: Payer: Self-pay | Admitting: *Deleted

## 2010-11-08 NOTE — Telephone Encounter (Signed)
yes

## 2010-11-08 NOTE — Telephone Encounter (Signed)
Unsure pharm, Left mess to call office back w/info

## 2010-11-08 NOTE — Telephone Encounter (Signed)
Pt left VM - Samples are working well and he would like RX. I am assuming actos plus met? OK for RX?

## 2010-11-09 MED ORDER — PIOGLITAZONE HCL-METFORMIN HCL 15-850 MG PO TABS: 1.0000 | ORAL_TABLET | Freq: Two times a day (BID) | ORAL | Status: DC

## 2010-11-12 ENCOUNTER — Other Ambulatory Visit: Payer: Self-pay | Admitting: *Deleted

## 2010-11-12 MED ORDER — PIOGLITAZONE HCL-METFORMIN HCL 15-850 MG PO TABS: 1.0000 | ORAL_TABLET | Freq: Two times a day (BID) | ORAL | Status: DC

## 2010-11-15 ENCOUNTER — Telehealth: Payer: Self-pay | Admitting: *Deleted

## 2010-11-15 MED ORDER — SAXAGLIPTIN-METFORMIN ER 5-1000 MG PO TB24: 1.0000 | ORAL_TABLET | Freq: Every day | ORAL | Status: DC

## 2010-11-15 NOTE — Telephone Encounter (Signed)
Patient requesting to resume metformin & glimepride. Actos plus met cost is over 200 dollars a month which he can not afford.

## 2010-11-15 NOTE — Telephone Encounter (Signed)
Ask him to come pick up kombiglyze samples

## 2010-11-15 NOTE — Telephone Encounter (Signed)
Patient informed. 

## 2011-01-14 LAB — GLUCOSE, CAPILLARY: Glucose-Capillary: 114 mg/dL — ABNORMAL HIGH (ref 70–99)

## 2011-01-28 LAB — HM DIABETES EYE EXAM

## 2011-02-08 ENCOUNTER — Other Ambulatory Visit: Payer: Self-pay

## 2011-02-08 MED ORDER — SAXAGLIPTIN-METFORMIN ER 5-1000 MG PO TB24: 1.0000 | ORAL_TABLET | Freq: Every day | ORAL | Status: DC

## 2011-03-07 ENCOUNTER — Other Ambulatory Visit: Payer: Self-pay | Admitting: Internal Medicine

## 2011-04-27 ENCOUNTER — Telehealth: Payer: Self-pay | Admitting: Internal Medicine

## 2011-04-27 NOTE — Telephone Encounter (Signed)
Pt requesting lesser [place] med/komdiglyce xr and glipizide---[facility] [address]

## 2011-04-28 NOTE — Telephone Encounter (Signed)
LMOM for patient that he can p/u samples of med Mon-Fri 8am-5pm.

## 2011-04-28 NOTE — Telephone Encounter (Signed)
There is not a lower [place] of that type of medicine

## 2011-06-08 ENCOUNTER — Other Ambulatory Visit (INDEPENDENT_AMBULATORY_CARE_PROVIDER_SITE_OTHER): Payer: Medicare Other

## 2011-06-08 ENCOUNTER — Encounter: Payer: Self-pay | Admitting: Internal Medicine

## 2011-06-08 ENCOUNTER — Ambulatory Visit (INDEPENDENT_AMBULATORY_CARE_PROVIDER_SITE_OTHER): Payer: Medicare Other | Admitting: Internal Medicine

## 2011-06-08 VITALS — BP 124/78 | HR 59 | Temp 97.8°F | Resp 16 | Wt 175.8 lb

## 2011-06-08 DIAGNOSIS — E1165 Type 2 diabetes mellitus with hyperglycemia: Secondary | ICD-10-CM | POA: Diagnosis not present

## 2011-06-08 DIAGNOSIS — I1310 Hypertensive heart and chronic kidney disease without heart failure, with stage 1 through stage 4 chronic kidney disease, or unspecified chronic kidney disease: Secondary | ICD-10-CM | POA: Diagnosis not present

## 2011-06-08 DIAGNOSIS — D649 Anemia, unspecified: Secondary | ICD-10-CM

## 2011-06-08 DIAGNOSIS — N058 Unspecified nephritic syndrome with other morphologic changes: Secondary | ICD-10-CM

## 2011-06-08 DIAGNOSIS — N189 Chronic kidney disease, unspecified: Secondary | ICD-10-CM

## 2011-06-08 DIAGNOSIS — R972 Elevated prostate specific antigen [PSA]: Secondary | ICD-10-CM

## 2011-06-08 DIAGNOSIS — E7800 Pure hypercholesterolemia, unspecified: Secondary | ICD-10-CM

## 2011-06-08 DIAGNOSIS — D510 Vitamin B12 deficiency anemia due to intrinsic factor deficiency: Secondary | ICD-10-CM | POA: Insufficient documentation

## 2011-06-08 DIAGNOSIS — E1129 Type 2 diabetes mellitus with other diabetic kidney complication: Secondary | ICD-10-CM

## 2011-06-08 LAB — URINALYSIS, ROUTINE W REFLEX MICROSCOPIC
Bilirubin Urine: NEGATIVE
Ketones, ur: NEGATIVE
Total Protein, Urine: NEGATIVE
Urine Glucose: NEGATIVE
pH: 6 (ref 5.0–8.0)

## 2011-06-08 LAB — COMPREHENSIVE METABOLIC PANEL
ALT: 31 U/L (ref 0–53)
CO2: 30 mEq/L (ref 19–32)
Calcium: 9.2 mg/dL (ref 8.4–10.5)
Chloride: 106 mEq/L (ref 96–112)
Creatinine, Ser: 0.8 mg/dL (ref 0.4–1.5)
GFR: 116.24 mL/min (ref >60.00)
Glucose, Bld: 117 mg/dL — ABNORMAL HIGH (ref 70–99)
Sodium: 140 mEq/L (ref 135–145)
Total Bilirubin: 0.7 mg/dL (ref 0.3–1.2)
Total Protein: 6.4 g/dL (ref 6.0–8.3)

## 2011-06-08 LAB — CBC WITH DIFFERENTIAL/PLATELET
Basophils Absolute: 0 10*3/uL (ref 0.0–0.1)
Eosinophils Relative: 3.3 % (ref 0.0–5.0)
HCT: 37.7 % — ABNORMAL LOW (ref 39.0–52.0)
Hemoglobin: 12.5 g/dL — ABNORMAL LOW (ref 13.0–17.0)
Lymphocytes Relative: 39.9 % (ref 12.0–46.0)
Lymphs Abs: 1.5 10*3/uL (ref 0.7–4.0)
Monocytes Relative: 10.3 % (ref 3.0–12.0)
Neutro Abs: 1.7 10*3/uL (ref 1.4–7.7)
RDW: 14.1 % (ref 11.5–14.6)
WBC: 3.8 10*3/uL — ABNORMAL LOW (ref 4.5–10.5)

## 2011-06-08 LAB — IBC PANEL
Iron: 78 ug/dL (ref 42–165)
Saturation Ratios: 21.1 % (ref 20.0–50.0)

## 2011-06-08 LAB — LIPID PANEL
Cholesterol: 108 mg/dL (ref 0–200)
HDL: 38 mg/dL — ABNORMAL LOW (ref >39.00)
LDL Cholesterol: 62 mg/dL (ref 0–99)
Triglycerides: 38 mg/dL (ref 0.0–149.0)
VLDL: 7.6 mg/dL (ref 0.0–40.0)

## 2011-06-08 LAB — FERRITIN: Ferritin: 47.8 ng/mL (ref 22.0–322.0)

## 2011-06-08 LAB — FOLATE: Folate: 19.8 ng/mL (ref >5.9)

## 2011-06-08 LAB — VITAMIN B12: Vitamin B-12: 618 pg/mL (ref 211–911)

## 2011-06-08 MED ORDER — SIMVASTATIN 40 MG PO TABS: ORAL_TABLET | ORAL | Status: DC

## 2011-06-08 MED ORDER — SAXAGLIPTIN-METFORMIN ER 5-1000 MG PO TB24: 1.0000 | ORAL_TABLET | Freq: Every day | ORAL | Status: DC

## 2011-06-08 MED ORDER — CELECOXIB 200 MG PO CAPS: 200.0000 mg | ORAL_CAPSULE | Freq: Every day | ORAL | Status: DC

## 2011-06-08 MED ORDER — RAMIPRIL 2.5 MG PO TABS: 2.5000 mg | ORAL_TABLET | Freq: Every day | ORAL | Status: DC

## 2011-06-08 NOTE — Assessment & Plan Note (Signed)
Check his FLP today 

## 2011-06-08 NOTE — Assessment & Plan Note (Signed)
He has no s/s of blood loss, I will recheck his CBC today and will look at this vitamin levels as well

## 2011-06-08 NOTE — Progress Notes (Signed)
  Subjective:    Patient ID: [name] [name], male    DOB: 09/02/1934, 77 y.o.   MRN: [id number]  Anemia Presents for follow-up visit. There has been no abdominal pain, anorexia, bruising/bleeding easily, confusion, fever, leg swelling, light-headedness, malaise/fatigue, pallor, palpitations, paresthesias, pica or weight loss. Signs of blood loss that are not present include hematemesis, hematochezia and melena. There are no compliance problems.       Review of Systems  Constitutional: Negative for fever, chills, weight loss, malaise/fatigue, diaphoresis, activity change, appetite change, fatigue and unexpected weight change.  HENT: Negative.   Eyes: Negative.   Respiratory: Negative for cough, choking, chest tightness, shortness of breath, wheezing and stridor.   Cardiovascular: Negative for chest pain, palpitations and leg swelling.  Gastrointestinal: Negative for nausea, vomiting, abdominal pain, diarrhea, constipation, blood in stool, melena, hematochezia, abdominal distention, anorexia and hematemesis.  Genitourinary: Negative for dysuria, urgency, frequency, hematuria, flank pain, decreased urine volume, enuresis and difficulty urinating.  Musculoskeletal: Negative for myalgias, back pain, joint swelling, arthralgias and gait problem.  Skin: Negative for pallor, rash and wound.  Neurological: Negative for dizziness, tremors, seizures, syncope, facial asymmetry, speech difficulty, weakness, light-headedness, numbness, headaches and paresthesias.  Hematological: Negative for adenopathy. Does not bruise/bleed easily.  Psychiatric/Behavioral: Negative.  Negative for confusion.       Objective:   Physical Exam  Vitals reviewed. Constitutional: He is oriented to person, place, and time. He appears well-developed and well-nourished. No distress.  HENT:  Head: Normocephalic and atraumatic. No trismus in the jaw.  Mouth/Throat: Oropharynx is clear and moist. Mucous membranes are pale, not dry  and not cyanotic. No uvula swelling. No oropharyngeal exudate, posterior oropharyngeal edema, posterior oropharyngeal erythema or tonsillar abscesses.  Eyes: Conjunctivae are normal. Right eye exhibits no discharge. Left eye exhibits no discharge. No scleral icterus.  Neck: Normal range of motion. Neck supple. No JVD present. No tracheal deviation present. No thyromegaly present.  Cardiovascular: Normal rate, regular rhythm, normal heart sounds and intact distal pulses.  Exam reveals no gallop and no friction rub.   No murmur heard. Pulmonary/Chest: Breath sounds normal. No stridor. No respiratory distress. He has no wheezes. He has no rales. He exhibits no tenderness.  Abdominal: Soft. Bowel sounds are normal. He exhibits no distension and no mass. There is no tenderness. There is no rebound and no guarding.  Musculoskeletal: Normal range of motion. He exhibits no edema and no tenderness.  Lymphadenopathy:    He has no cervical adenopathy.  Neurological: He is oriented to person, place, and time.  Skin: Skin is warm and dry. No rash noted. He is not diaphoretic. No erythema. No pallor.  Psychiatric: He has a normal mood and affect. His behavior is normal. Judgment and thought content normal.      Lab Results  Component Value Date   WBC 4.2* 08/17/2010   HGB 12.6* 08/17/2010   HCT 37.0* 08/17/2010   PLT 283.0 08/17/2010   GLUCOSE 69* 08/17/2010   CHOL 129 08/17/2010   TRIG 59.0 08/17/2010   HDL 41.30 08/17/2010   LDLCALC 76 08/17/2010   ALT 18 08/17/2010   AST 17 08/17/2010   NA 142 08/17/2010   K 4.7 08/17/2010   CL 104 08/17/2010   CREATININE 0.9 08/17/2010   BUN 11 08/17/2010   CO2 30 08/17/2010   TSH 1.89 08/17/2010   PSA 3.27 08/17/2010   HGBA1C 6.4 08/17/2010   MICROALBUR 1.4 02/12/2010      Assessment & Plan:

## 2011-06-08 NOTE — Assessment & Plan Note (Signed)
B12 and folate levels today

## 2011-06-08 NOTE — Assessment & Plan Note (Signed)
Recheck his PSA today 

## 2011-06-08 NOTE — Assessment & Plan Note (Signed)
I will recheck his a1c today and will monitor his renal function 

## 2011-06-08 NOTE — Assessment & Plan Note (Signed)
His BP is well controlled, I will check his lytes and renal function 

## 2011-06-08 NOTE — Patient Instructions (Signed)
Diabetes, Type 2 Diabetes is a long-lasting (chronic) disease. In type 2 diabetes, the pancreas does not make enough insulin (a hormone), and the body does not respond normally to the insulin that is made. This type of diabetes was also previously called adult-onset diabetes. It usually occurs after the age of 40, but it can occur at any age.  CAUSES  Type 2 diabetes happens because the pancreasis not making enough insulin or your body has trouble using the insulin that your pancreas does make properly. SYMPTOMS   Drinking more than usual.   Urinating more than usual.   Blurred vision.   Dry, itchy skin.   Frequent infections.   Feeling more tired than usual (fatigue).  DIAGNOSIS The diagnosis of type 2 diabetes is usually made by one of the following tests:  Fasting blood glucose test. You will not eat for at least 8 hours and then take a blood test.   Random blood glucose test. Your blood glucose (sugar) is checked at any time of the day regardless of when you ate.   Oral glucose tolerance test (OGTT). Your blood glucose is measured after you have not eaten (fasted) and then after you drink a glucose containing beverage.  TREATMENT   Healthy eating.   Exercise.   Medicine, if needed.   Monitoring blood glucose.   Seeing your caregiver regularly.  HOME CARE INSTRUCTIONS   Check your blood glucose at least once a day. More frequent monitoring may be necessary, depending on your medicines and on how well your diabetes is controlled. Your caregiver will advise you.   Take your medicine as directed by your caregiver.   Do not smoke.   Make wise food choices. Ask your caregiver for information. Weight loss can improve your diabetes.   Learn about low blood glucose (hypoglycemia) and how to treat it.   Get your eyes checked regularly.   Have a yearly physical exam. Have your blood pressure checked and your blood and urine tested.   Wear a pendant or bracelet saying  that you have diabetes.   Check your feet every night for cuts, sores, blisters, and redness. Let your caregiver know if you have any problems.  SEEK MEDICAL CARE IF:   You have problems keeping your blood glucose in target range.   You have problems with your medicines.   You have symptoms of an illness that do not improve after 24 hours.   You have a sore or wound that is not healing.   You notice a change in vision or a new problem with your vision.   You have a fever.  MAKE SURE YOU:  Understand these instructions.   Will watch your condition.   Will get help right away if you are not doing well or get worse.  Document Released: 03/28/2005 Document Revised: 12/09/2010 Document Reviewed: 09/13/2010 Exit[employer] Patient Information 2012 [employer].Anemia, Nonspecific Your exam and blood tests show you are anemic. This means your blood (hemoglobin) level is low. Normal hemoglobin values are 12 to 15 g/dL for females and 14 to 17 g/dL for males. Make a note of your hemoglobin level today. The hematocrit percent is also used to measure anemia. A normal hematocrit is 38% to 46% in females and 42% to 49% in males. Make a note of your hematocrit level today. CAUSES  Anemia can be due to many different causes.  Excessive bleeding from periods (in women).   Intestinal bleeding.   Poor nutrition.   Kidney, thyroid,   liver, and bone marrow diseases.  SYMPTOMS  Anemia can come on suddenly (acute). It can also come on slowly. Symptoms can include:  Minor weakness.   Dizziness.   Palpitations.   Shortness of breath.  Symptoms may be absent until half your hemoglobin is missing if it comes on slowly. Anemia due to acute blood loss from an injury or internal bleeding may require blood transfusion if the loss is severe. Hospital care is needed if you are anemic and there is significant continual blood loss. TREATMENT   Stool tests for blood (Hemoccult) and additional lab tests are  often needed. This determines the best treatment.   Further checking on your condition and your response to treatment is very important. It often takes many weeks to correct anemia.  Depending on the cause, treatment can include:  Supplements of iron.   Vitamins B12 and folic acid.   Hormone medicines.If your anemia is due to bleeding, finding the cause of the blood loss is very important. This will help avoid further problems.  SEEK IMMEDIATE MEDICAL CARE IF:   You develop fainting, extreme weakness, shortness of breath, or chest pain.   You develop heavy vaginal bleeding.   You develop bloody or black, tarry stools or vomit up blood.   You develop a high fever, rash, repeated vomiting, or dehydration.  Document Released: 05/05/2004 Document Revised: 12/08/2010 Document Reviewed: 02/10/2009 [employer] Patient Information 2012 [employer]. 

## 2011-06-09 ENCOUNTER — Telehealth: Payer: Self-pay

## 2011-06-09 NOTE — Telephone Encounter (Signed)
ok 

## 2011-06-09 NOTE — Telephone Encounter (Signed)
Patient requesting a copy of lab results to be mailed out to him. Thanks

## 2011-07-21 ENCOUNTER — Telehealth: Payer: Self-pay | Admitting: *Deleted

## 2011-07-21 NOTE — Telephone Encounter (Signed)
Left msg on triage just want to inform md will be receiving some forms from [facility] concerning a back brace & knee pads... 07/21/11@10 :31am/LMB

## 2011-08-03 DIAGNOSIS — C61 Malignant neoplasm of prostate: Secondary | ICD-10-CM | POA: Diagnosis not present

## 2011-08-09 ENCOUNTER — Telehealth: Payer: Self-pay

## 2011-08-09 NOTE — Telephone Encounter (Signed)
Patient called requesting rx for back brace and knee pads from [facility] due to his osteoarthritis. Please advise if of to provide to pt.

## 2011-08-10 DIAGNOSIS — N401 Enlarged prostate with lower urinary tract symptoms: Secondary | ICD-10-CM | POA: Diagnosis not present

## 2011-08-10 DIAGNOSIS — C61 Malignant neoplasm of prostate: Secondary | ICD-10-CM | POA: Diagnosis not present

## 2011-08-10 NOTE — Telephone Encounter (Signed)
Patient notified/LMOVM to call back and make appt

## 2011-08-10 NOTE — Telephone Encounter (Signed)
He needs to be seen, I do not have the info needed from him to complete the forms

## 2011-08-16 ENCOUNTER — Encounter: Payer: Self-pay | Admitting: Internal Medicine

## 2011-08-16 ENCOUNTER — Ambulatory Visit (INDEPENDENT_AMBULATORY_CARE_PROVIDER_SITE_OTHER): Payer: Medicare Other | Admitting: Internal Medicine

## 2011-08-16 ENCOUNTER — Other Ambulatory Visit (INDEPENDENT_AMBULATORY_CARE_PROVIDER_SITE_OTHER): Payer: Medicare Other

## 2011-08-16 VITALS — BP 108/70 | HR 78 | Temp 98.1°F | Resp 16 | Wt 176.0 lb

## 2011-08-16 DIAGNOSIS — I1310 Hypertensive heart and chronic kidney disease without heart failure, with stage 1 through stage 4 chronic kidney disease, or unspecified chronic kidney disease: Secondary | ICD-10-CM

## 2011-08-16 DIAGNOSIS — M1990 Unspecified osteoarthritis, unspecified site: Secondary | ICD-10-CM

## 2011-08-16 DIAGNOSIS — E7800 Pure hypercholesterolemia, unspecified: Secondary | ICD-10-CM

## 2011-08-16 DIAGNOSIS — N189 Chronic kidney disease, unspecified: Secondary | ICD-10-CM

## 2011-08-16 DIAGNOSIS — D510 Vitamin B12 deficiency anemia due to intrinsic factor deficiency: Secondary | ICD-10-CM

## 2011-08-16 DIAGNOSIS — N058 Unspecified nephritic syndrome with other morphologic changes: Secondary | ICD-10-CM | POA: Diagnosis not present

## 2011-08-16 DIAGNOSIS — E1165 Type 2 diabetes mellitus with hyperglycemia: Secondary | ICD-10-CM

## 2011-08-16 DIAGNOSIS — D649 Anemia, unspecified: Secondary | ICD-10-CM

## 2011-08-16 DIAGNOSIS — Z79899 Other long term (current) drug therapy: Secondary | ICD-10-CM | POA: Diagnosis not present

## 2011-08-16 DIAGNOSIS — E1129 Type 2 diabetes mellitus with other diabetic kidney complication: Secondary | ICD-10-CM

## 2011-08-16 DIAGNOSIS — M545 Low back pain, unspecified: Secondary | ICD-10-CM

## 2011-08-16 LAB — CBC WITH DIFFERENTIAL/PLATELET
Basophils Relative: 0.5 % (ref 0.0–3.0)
Eosinophils Relative: 1.4 % (ref 0.0–5.0)
Lymphocytes Relative: 31.3 % (ref 12.0–46.0)
Monocytes Relative: 11.3 % (ref 3.0–12.0)
Neutrophils Relative %: 55.5 % (ref 43.0–77.0)
RBC: 3.96 Mil/uL — ABNORMAL LOW (ref 4.22–5.81)
WBC: 4 10*3/uL — ABNORMAL LOW (ref 4.5–10.5)

## 2011-08-16 LAB — IBC PANEL
Iron: 86 ug/dL (ref 42–165)
Saturation Ratios: 22 % (ref 20.0–50.0)
Transferrin: 278.9 mg/dL (ref 212.0–360.0)

## 2011-08-16 LAB — COMPREHENSIVE METABOLIC PANEL
ALT: 28 U/L (ref 0–53)
AST: 22 U/L (ref 0–37)
BUN: 13 mg/dL (ref 6–23)
Creatinine, Ser: 0.8 mg/dL (ref 0.4–1.5)
GFR: 114.58 mL/min (ref >60.00)
Total Bilirubin: 0.6 mg/dL (ref 0.3–1.2)

## 2011-08-16 LAB — FOLATE: Folate: >24.8 ng/mL (ref >5.9)

## 2011-08-16 LAB — FERRITIN: Ferritin: 46.1 ng/mL (ref 22.0–322.0)

## 2011-08-16 NOTE — Patient Instructions (Signed)
Degenerative Arthritis You have osteoarthritis. This is the wear and tear arthritis that comes with aging. It is also called degenerative arthritis. This is common in people past middle age. It is caused by stress on the joints. The large weight bearing joints of the lower extremities are most often affected. The knees, hips, back, neck, and hands can become painful, swollen, and stiff. This is the most common type of arthritis. It comes on with age, carrying too much weight, or from an injury. Treatment includes resting the sore joint until the pain and swelling improve. Crutches or a walker may be needed for severe flares. Only take over-the-counter or prescription medicines for pain, discomfort, or fever as directed by your caregiver. Local heat therapy may improve motion. Cortisone shots into the joint are sometimes used to reduce pain and swelling during flares. Osteoarthritis is usually not crippling and progresses slowly. There are things you can do to decrease pain:  Avoid high impact activities.   Exercise regularly.   Low impact exercises such as walking, biking and swimming help to keep the muscles strong and keep normal joint function.   Stretching helps to keep your range of motion.   Lose weight if you are overweight. This reduces joint stress.  In severe cases when you have pain at rest or increasing disability, joint surgery may be helpful. See your caregiver for follow-up treatment as recommended.  SEEK IMMEDIATE MEDICAL CARE IF:   You have severe joint pain.   Marked swelling and redness in your joint develops.   You develop a high fever.  Document Released: 03/28/2005 Document Revised: 03/17/2011 Document Reviewed: 08/28/2006 [facility] Patient Information 2012 [place], [place].Diabetes, Type 2 Diabetes is a long-lasting (chronic) disease. In type 2 diabetes, the pancreas does not make enough insulin (a hormone), and the body does not respond normally to the insulin that is  made. This type of diabetes was also previously called adult-onset diabetes. It usually occurs after the age of 50, but it can occur at any age.  CAUSES  Type 2 diabetes happens because the pancreasis not making enough insulin or your body has trouble using the insulin that your pancreas does make properly. SYMPTOMS   Drinking more than usual.   Urinating more than usual.   Blurred vision.   Dry, itchy skin.   Frequent infections.   Feeling more tired than usual (fatigue).  DIAGNOSIS The diagnosis of type 2 diabetes is usually made by one of the following tests:  Fasting blood glucose test. You will not eat for at least 8 hours and then take a blood test.   Random blood glucose test. Your blood glucose (sugar) is checked at any time of the day regardless of when you ate.   Oral glucose tolerance test (OGTT). Your blood glucose is measured after you have not eaten (fasted) and then after you drink a glucose containing beverage.  TREATMENT   Healthy eating.   Exercise.   Medicine, if needed.   Monitoring blood glucose.   Seeing your caregiver regularly.  HOME CARE INSTRUCTIONS   Check your blood glucose at least once a day. More frequent monitoring may be necessary, depending on your medicines and on how well your diabetes is controlled. Your caregiver will advise you.   Take your medicine as directed by your caregiver.   Do not smoke.   Make wise food choices. Ask your caregiver for information. Weight loss can improve your diabetes.   Learn about low blood glucose (hypoglycemia) and how  to treat it.   Get your eyes checked regularly.   Have a yearly physical exam. Have your blood pressure checked and your blood and urine tested.   Wear a pendant or bracelet saying that you have diabetes.   Check your feet every night for cuts, sores, blisters, and redness. Let your caregiver know if you have any problems.  SEEK MEDICAL CARE IF:   You have problems keeping your  blood glucose in target range.   You have problems with your medicines.   You have symptoms of an illness that do not improve after 24 hours.   You have a sore or wound that is not healing.   You notice a change in vision or a new problem with your vision.   You have a fever.  MAKE SURE YOU:  Understand these instructions.   Will watch your condition.   Will get help right away if you are not doing well or get worse.  Document Released: 03/28/2005 Document Revised: 03/17/2011 Document Reviewed: 09/13/2010 [facility] Patient Information 2012 [place], [place].

## 2011-08-16 NOTE — Progress Notes (Signed)
Subjective:    Patient ID: [name] [name], male    DOB: 1934/12/23, 77 y.o.   MRN: [id number]  Back Pain This is a chronic problem. The current episode started more than 1 year ago. The problem occurs intermittently. The problem is unchanged. The pain is present in the lumbar spine. The quality of the pain is described as aching. The pain does not radiate. The pain is at a severity of 3/10. The pain is mild. The pain is worse during the day. The symptoms are aggravated by bending and twisting. Stiffness is present in the morning. Pertinent negatives include no abdominal pain, bladder incontinence, bowel incontinence, chest pain, dysuria, fever, headaches, leg pain, numbness, paresis, paresthesias, pelvic pain, perianal numbness, tingling, weakness or weight loss. He has tried NSAIDs for the symptoms. The treatment provided moderate relief.  Arthritis Presents for follow-up visit. He complains of pain. He reports no stiffness, joint swelling or joint warmth. Affected locations include the left knee and right knee. His pain is at a severity of 2/10. Pertinent negatives include no diarrhea, dry eyes, dry mouth, dysuria, fatigue, fever, pain at night, pain while resting, rash, Raynaud's syndrome, uveitis or weight loss. Compliance with total regimen is 26-50%. Compliance problems include medication cost.  Compliance with medications is 26-50%.      Review of Systems  Constitutional: Negative for fever, chills, weight loss, diaphoresis, activity change, appetite change, fatigue and unexpected weight change.  HENT: Negative.   Eyes: Negative.   Respiratory: Negative for cough, chest tightness, shortness of breath, wheezing and stridor.   Cardiovascular: Negative for chest pain, palpitations and leg swelling.  Gastrointestinal: Negative for nausea, vomiting, abdominal pain, diarrhea, constipation, abdominal distention and bowel incontinence.  Genitourinary: Negative.  Negative for bladder incontinence,  dysuria and pelvic pain.  Musculoskeletal: Positive for back pain, arthralgias (both knees) and arthritis. Negative for myalgias, joint swelling, gait problem and stiffness.  Skin: Negative for color change, pallor, rash and wound.  Neurological: Negative for dizziness, tingling, tremors, seizures, syncope, facial asymmetry, speech difficulty, weakness, light-headedness, numbness, headaches and paresthesias.  Hematological: Negative for adenopathy. Does not bruise/bleed easily.  Psychiatric/Behavioral: Negative.        Objective:   Physical Exam  Vitals reviewed. Constitutional: He is oriented to person, place, and time. He appears well-developed and well-nourished. No distress.  HENT:  Head: Normocephalic and atraumatic.  Mouth/Throat: Oropharynx is clear and moist. No oropharyngeal exudate.  Eyes: Conjunctivae are normal. Right eye exhibits no discharge. Left eye exhibits no discharge. No scleral icterus.  Neck: Normal range of motion. Neck supple. No JVD present. No tracheal deviation present. No thyromegaly present.  Cardiovascular: Normal rate, regular rhythm, normal heart sounds and intact distal pulses.  Exam reveals no gallop and no friction rub.   No murmur heard. Pulmonary/Chest: Effort normal and breath sounds normal. No stridor. No respiratory distress. He has no wheezes. He has no rales. He exhibits no tenderness.  Abdominal: Soft. Bowel sounds are normal. He exhibits no distension and no mass. There is no tenderness. There is no rebound and no guarding.  Musculoskeletal: Normal range of motion. He exhibits no edema and no tenderness.       Right knee: He exhibits deformity (mild crepitance). He exhibits normal range of motion, no swelling, no effusion, no ecchymosis, no laceration, no erythema, normal alignment, no LCL laxity and normal patellar mobility. no tenderness found.       Left knee: He exhibits deformity (mild crepitance). He exhibits normal range of motion, no  swelling, no effusion, no ecchymosis, no laceration, no erythema, normal alignment, no LCL laxity and normal patellar mobility. no tenderness found.  Lymphadenopathy:    He has no cervical adenopathy.  Neurological: He is oriented to person, place, and time.  Skin: Skin is warm and dry. No rash noted. He is not diaphoretic. No erythema. No pallor.  Psychiatric: He has a normal mood and affect. His behavior is normal. Judgment and thought content normal.      Lab Results  Component Value Date   WBC 3.8* 06/08/2011   HGB 12.5* 06/08/2011   HCT 37.7* 06/08/2011   PLT 242.0 06/08/2011   GLUCOSE 117* 06/08/2011   CHOL 108 06/08/2011   TRIG 38.0 06/08/2011   HDL 38.00* 06/08/2011   LDLCALC 62 06/08/2011   ALT 31 06/08/2011   AST 23 06/08/2011   NA 140 06/08/2011   K 4.3 06/08/2011   CL 106 06/08/2011   CREATININE 0.8 06/08/2011   BUN 13 06/08/2011   CO2 30 06/08/2011   TSH 1.89 08/17/2010   PSA 3.43 06/08/2011   HGBA1C 6.8* 06/08/2011   MICROALBUR 1.4 02/12/2010      Assessment & Plan:

## 2011-08-19 NOTE — Assessment & Plan Note (Signed)
He is doing well on zocor 

## 2011-08-19 NOTE — Assessment & Plan Note (Signed)
CBC, B12, folate levels today

## 2011-08-19 NOTE — Assessment & Plan Note (Signed)
He will continue celebrex as needed

## 2011-08-19 NOTE — Assessment & Plan Note (Signed)
I will check his a1c and his renal function today 

## 2011-08-19 NOTE — Assessment & Plan Note (Signed)
His BP is well controlled, I will check his lytes and renal function 

## 2011-08-19 NOTE — Assessment & Plan Note (Signed)
CBC and iron levels today 

## 2011-09-28 ENCOUNTER — Telehealth: Payer: Self-pay

## 2011-09-28 ENCOUNTER — Other Ambulatory Visit: Payer: Self-pay | Admitting: *Deleted

## 2011-09-28 DIAGNOSIS — E1165 Type 2 diabetes mellitus with hyperglycemia: Secondary | ICD-10-CM

## 2011-09-28 DIAGNOSIS — E1129 Type 2 diabetes mellitus with other diabetic kidney complication: Secondary | ICD-10-CM

## 2011-09-28 MED ORDER — SITAGLIP PHOS-METFORMIN HCL ER 100-1000 MG PO TB24: 1.0000 | ORAL_TABLET | Freq: Every day | ORAL | Status: DC

## 2011-09-28 MED ORDER — GLYBURIDE-METFORMIN 2.5-500 MG PO TABS: 1.0000 | ORAL_TABLET | Freq: Two times a day (BID) | ORAL | Status: DC

## 2011-09-28 NOTE — Telephone Encounter (Signed)
Pt advised of Rx/pharmacy 

## 2011-09-28 NOTE — Telephone Encounter (Signed)
changed

## 2011-09-28 NOTE — Telephone Encounter (Signed)
done

## 2011-09-28 NOTE — Telephone Encounter (Signed)
Pt called stating that Saxagliptin Metformin 08-998 is not covered by insurance - $140. Pt is requesting a cheaper alternative.

## 2011-09-28 NOTE — Telephone Encounter (Signed)
Pt's states that [name] [name] is too expensive and insurance will not cover ($307)-pt is requesting cheapter alternative medication or to go back on Glyburide-Metformin.

## 2011-09-28 NOTE — Telephone Encounter (Signed)
Pt informed rx sent to pharmacy via VM and to callback office with any questions/concerns.

## 2011-11-30 ENCOUNTER — Telehealth: Payer: Self-pay | Admitting: Internal Medicine

## 2011-11-30 ENCOUNTER — Encounter: Payer: Self-pay | Admitting: Internal Medicine

## 2011-11-30 ENCOUNTER — Ambulatory Visit (INDEPENDENT_AMBULATORY_CARE_PROVIDER_SITE_OTHER): Payer: Medicare Other | Admitting: Internal Medicine

## 2011-11-30 ENCOUNTER — Other Ambulatory Visit (INDEPENDENT_AMBULATORY_CARE_PROVIDER_SITE_OTHER): Payer: Medicare Other

## 2011-11-30 VITALS — BP 110/70 | HR 76 | Temp 99.0°F | Resp 16 | Wt 164.0 lb

## 2011-11-30 DIAGNOSIS — R1031 Right lower quadrant pain: Secondary | ICD-10-CM

## 2011-11-30 DIAGNOSIS — K5732 Diverticulitis of large intestine without perforation or abscess without bleeding: Secondary | ICD-10-CM

## 2011-11-30 DIAGNOSIS — K5792 Diverticulitis of intestine, part unspecified, without perforation or abscess without bleeding: Secondary | ICD-10-CM

## 2011-11-30 LAB — CBC WITH DIFFERENTIAL/PLATELET
Basophils Absolute: 0 10*3/uL (ref 0.0–0.1)
Eosinophils Absolute: 0.1 10*3/uL (ref 0.0–0.7)
HCT: 39.8 % (ref 39.0–52.0)
Lymphs Abs: 0.8 10*3/uL (ref 0.7–4.0)
MCHC: 33.1 g/dL (ref 30.0–36.0)
MCV: 97.5 fl (ref 78.0–100.0)
Monocytes Absolute: 0.7 10*3/uL (ref 0.1–1.0)
Platelets: 267 10*3/uL (ref 150.0–400.0)
RDW: 14.4 % (ref 11.5–14.6)

## 2011-11-30 LAB — URINALYSIS, ROUTINE W REFLEX MICROSCOPIC
Leukocytes, UA: NEGATIVE
Specific Gravity, Urine: 1.015 (ref 1.000–1.030)
Urobilinogen, UA: 0.2 (ref 0.0–1.0)

## 2011-11-30 LAB — BASIC METABOLIC PANEL
Calcium: 9.3 mg/dL (ref 8.4–10.5)
GFR: 99.33 mL/min (ref >60.00)
Sodium: 137 mEq/L (ref 135–145)

## 2011-11-30 LAB — HEPATIC FUNCTION PANEL
AST: 24 U/L (ref 0–37)
Albumin: 4.4 g/dL (ref 3.5–5.2)
Alkaline Phosphatase: 59 U/L (ref 39–117)

## 2011-11-30 LAB — SEDIMENTATION RATE: Sed Rate: 12 mm/hr (ref 0–22)

## 2011-11-30 MED ORDER — CIPROFLOXACIN HCL 500 MG PO TABS: 500.0000 mg | ORAL_TABLET | Freq: Two times a day (BID) | ORAL | Status: AC

## 2011-11-30 MED ORDER — METRONIDAZOLE 500 MG PO TABS: 500.0000 mg | ORAL_TABLET | Freq: Three times a day (TID) | ORAL | Status: AC

## 2011-11-30 MED ORDER — TRAMADOL HCL 50 MG PO TABS: 50.0000 mg | ORAL_TABLET | Freq: Two times a day (BID) | ORAL | Status: DC | PRN

## 2011-11-30 NOTE — Progress Notes (Signed)
Patient ID: [name] [name], male   DOB: 1934/07/08, 77 y.o.   MRN: [id number]  Subjective:    Patient ID: [name] [name], male    DOB: 10-Aug-1934, 77 y.o.   MRN: [id number]  Abdominal Pain This is a new problem. The current episode started in the past 7 days. The onset quality is gradual. The problem occurs constantly. The problem has been gradually worsening. The pain is located in the RLQ. The pain is at a severity of 8/10. The pain is moderate. The quality of the pain is dull and aching. The abdominal pain radiates to the LLQ. Pertinent negatives include no arthralgias, constipation, diarrhea, dysuria, fever, frequency, headaches, hematuria, myalgias, nausea or vomiting. Nothing aggravates the pain. He has tried nothing for the symptoms.      Review of Systems  Constitutional: Negative for fever, chills, diaphoresis, activity change, appetite change, fatigue and unexpected weight change.  HENT: Negative.   Eyes: Negative.   Respiratory: Negative for cough, choking, chest tightness, shortness of breath, wheezing and stridor.   Cardiovascular: Negative for chest pain and leg swelling.  Gastrointestinal: Positive for abdominal pain. Negative for nausea, vomiting, diarrhea, constipation, blood in stool and abdominal distention.  Genitourinary: Negative for dysuria, urgency, frequency, hematuria, flank pain, decreased urine volume, enuresis and difficulty urinating.  Musculoskeletal: Negative for myalgias, back pain, joint swelling, arthralgias and gait problem.  Skin: Negative for rash and wound.  Neurological: Negative for dizziness, tremors, seizures, syncope, facial asymmetry, speech difficulty, weakness, numbness and headaches.  Hematological: Negative for adenopathy.  Psychiatric/Behavioral: Negative.        Objective:   Physical Exam  Vitals reviewed. Constitutional: He is oriented to person, place, and time. He appears well-developed and well-nourished. No distress.  HENT:  Head:  Normocephalic and atraumatic. No trismus in the jaw.  Mouth/Throat: Oropharynx is clear and moist. Mucous membranes are pale, not dry and not cyanotic. No uvula swelling. No oropharyngeal exudate, posterior oropharyngeal edema, posterior oropharyngeal erythema or tonsillar abscesses.  Eyes: Conjunctivae are normal. Right eye exhibits no discharge. Left eye exhibits no discharge. No scleral icterus.  Neck: Normal range of motion. Neck supple. No JVD present. No tracheal deviation present. No thyromegaly present.  Cardiovascular: Normal rate, regular rhythm, normal heart sounds and intact distal pulses.  Exam reveals no gallop and no friction rub.   No murmur heard. Pulmonary/Chest: Breath sounds normal. No stridor. No respiratory distress. He has no wheezes. He has no rales. He exhibits no tenderness.  Abdominal: Soft. Bowel sounds are normal. He exhibits no distension and no mass. There is tenderness (LLQ). There is no rebound and no guarding.  Musculoskeletal: Normal range of motion. He exhibits no edema and no tenderness.  Lymphadenopathy:    He has no cervical adenopathy.  Neurological: He is oriented to person, place, and time.  Skin: Skin is warm and dry. No rash noted. He is not diaphoretic. No erythema. No pallor.  Psychiatric: He has a normal mood and affect. His behavior is normal. Judgment and thought content normal.      Lab Results  Component Value Date   WBC 4.0* 08/16/2011   HGB 12.9* 08/16/2011   HCT 38.6* 08/16/2011   PLT 274.0 08/16/2011   GLUCOSE 162* 08/16/2011   CHOL 108 06/08/2011   TRIG 38.0 06/08/2011   HDL 38.00* 06/08/2011   LDLCALC 62 06/08/2011   ALT 28 08/16/2011   AST 22 08/16/2011   NA 140 08/16/2011   K 4.5 08/16/2011   CL 101 08/16/2011  CREATININE 0.8 08/16/2011   BUN 13 08/16/2011   CO2 30 08/16/2011   TSH 1.89 08/17/2010   PSA 3.43 06/08/2011   HGBA1C 6.5 08/16/2011   MICROALBUR 1.4 02/12/2010      Assessment & Plan:

## 2011-11-30 NOTE — Telephone Encounter (Signed)
[name] [name], please, inform patient that all labs are normal except for red cells in urine Rx as we planned. Needs to f/u with Dr Yetta Barre inn 3 wks Thx

## 2011-11-30 NOTE — Assessment & Plan Note (Signed)
Low residue diet Tramadol, Cipro, Flagyl Labs CT if needed Dr Yetta Barre in 2-3 wks

## 2011-11-30 NOTE — Telephone Encounter (Signed)
Caller: Daray/Patient; Patient Name: [name] [name]; PCP: Sanda Linger; Best Callback Phone Number: [phone].  Call regarding Constipation and Lower Abdominal Pain, onset 8-19.  Patient had bowel movement on 8-21 after taking Milk-of-Mag.  FSBS 192. Afebrile.  Diabetes Gastrointestinal Problems Protocol used.  Appointment scheduled on 8-21 due to generalized abdominal pain that progresses to localized pain.  Patient verbalized understanding.

## 2011-11-30 NOTE — Assessment & Plan Note (Signed)
Low residue diet Tramadol, Cipro, Flagyl Labs CT if needed Dr Jones in 2-3 wks 

## 2011-12-01 NOTE — Telephone Encounter (Signed)
Pt informed

## 2011-12-13 ENCOUNTER — Other Ambulatory Visit (INDEPENDENT_AMBULATORY_CARE_PROVIDER_SITE_OTHER): Payer: Medicare Other

## 2011-12-13 ENCOUNTER — Ambulatory Visit (INDEPENDENT_AMBULATORY_CARE_PROVIDER_SITE_OTHER): Payer: Medicare Other | Admitting: Internal Medicine

## 2011-12-13 ENCOUNTER — Encounter: Payer: Self-pay | Admitting: Internal Medicine

## 2011-12-13 VITALS — BP 130/70 | HR 69 | Temp 97.8°F | Resp 16 | Wt 156.8 lb

## 2011-12-13 DIAGNOSIS — E1129 Type 2 diabetes mellitus with other diabetic kidney complication: Secondary | ICD-10-CM

## 2011-12-13 DIAGNOSIS — R319 Hematuria, unspecified: Secondary | ICD-10-CM | POA: Diagnosis not present

## 2011-12-13 DIAGNOSIS — R1031 Right lower quadrant pain: Secondary | ICD-10-CM

## 2011-12-13 DIAGNOSIS — E1165 Type 2 diabetes mellitus with hyperglycemia: Secondary | ICD-10-CM

## 2011-12-13 DIAGNOSIS — B0229 Other postherpetic nervous system involvement: Secondary | ICD-10-CM | POA: Insufficient documentation

## 2011-12-13 DIAGNOSIS — N058 Unspecified nephritic syndrome with other morphologic changes: Secondary | ICD-10-CM

## 2011-12-13 DIAGNOSIS — B029 Zoster without complications: Secondary | ICD-10-CM

## 2011-12-13 LAB — URINALYSIS, ROUTINE W REFLEX MICROSCOPIC
Ketones, ur: NEGATIVE
Nitrite: NEGATIVE

## 2011-12-13 LAB — COMPREHENSIVE METABOLIC PANEL
Alkaline Phosphatase: 54 U/L (ref 39–117)
BUN: 22 mg/dL (ref 6–23)
CO2: 30 mEq/L (ref 19–32)
Creatinine, Ser: 1 mg/dL (ref 0.4–1.5)
GFR: 89.47 mL/min (ref >60.00)
Glucose, Bld: 87 mg/dL (ref 70–99)
Sodium: 140 mEq/L (ref 135–145)
Total Bilirubin: 0.9 mg/dL (ref 0.3–1.2)

## 2011-12-13 LAB — FECAL OCCULT BLOOD, GUAIAC: Fecal Occult Blood: NEGATIVE

## 2011-12-13 MED ORDER — TRAMADOL HCL 50 MG PO TABS: 50.0000 mg | ORAL_TABLET | Freq: Two times a day (BID) | ORAL | Status: AC | PRN

## 2011-12-13 NOTE — Progress Notes (Signed)
Subjective:    Patient ID: [name] [name], male    DOB: 22-Aug-1934, 77 y.o.   MRN: [id number]  Abdominal Pain This is a recurrent problem. The current episode started 1 to 4 weeks ago. The onset quality is gradual. The problem occurs intermittently. The problem has been unchanged. The pain is located in the LLQ and RLQ. The pain is at a severity of 2/10. The pain is mild. The quality of the pain is aching. The abdominal pain does not radiate. Associated symptoms include anorexia, hematuria, nausea and weight loss. Pertinent negatives include no arthralgias, belching, constipation, diarrhea, dysuria, fever, flatus, frequency, headaches, hematochezia, melena, myalgias or vomiting. Nothing aggravates the pain. The pain is relieved by nothing. He has tried oral narcotic analgesics and antibiotics for the symptoms. The treatment provided moderate relief.      Review of Systems  Constitutional: Positive for weight loss, appetite change and unexpected weight change. Negative for fever, chills, diaphoresis, activity change and fatigue.  HENT: Negative.   Eyes: Negative.   Respiratory: Negative for apnea, cough, choking, chest tightness, shortness of breath, wheezing and stridor.   Cardiovascular: Negative for chest pain, palpitations and leg swelling.  Gastrointestinal: Positive for nausea, abdominal pain and anorexia. Negative for vomiting, diarrhea, constipation, blood in stool, melena, hematochezia, abdominal distention, anal bleeding, rectal pain and flatus.  Genitourinary: Positive for hematuria. Negative for dysuria, urgency, frequency, flank pain, decreased urine volume, discharge, penile swelling, scrotal swelling, enuresis, difficulty urinating, genital sores, penile pain and testicular pain.  Musculoskeletal: Negative for myalgias, back pain, joint swelling, arthralgias and gait problem.  Skin: Positive for rash (left back and flank - resolved over the last 3 weeks). Negative for color change,  pallor and wound.  Neurological: Negative for dizziness, tremors, seizures, syncope, facial asymmetry, speech difficulty, weakness, light-headedness, numbness and headaches.  Hematological: Negative for adenopathy. Does not bruise/bleed easily.  Psychiatric/Behavioral: Negative.        Objective:   Physical Exam  Vitals reviewed. Constitutional: He is oriented to person, place, and time. He appears well-developed and well-nourished. No distress.  HENT:  Head: Normocephalic and atraumatic.  Mouth/Throat: Oropharynx is clear and moist. No oropharyngeal exudate.  Eyes: Conjunctivae are normal. Right eye exhibits no discharge. Left eye exhibits no discharge. No scleral icterus.  Neck: Normal range of motion. Neck supple. No JVD present. No tracheal deviation present. No thyromegaly present.  Cardiovascular: Normal rate, regular rhythm, normal heart sounds and intact distal pulses.  Exam reveals no gallop and no friction rub.   No murmur heard. Pulmonary/Chest: Effort normal and breath sounds normal. No stridor. No respiratory distress. He has no wheezes. He has no rales. He exhibits no tenderness.  Abdominal: Soft. Bowel sounds are normal. He exhibits no distension and no mass. There is no tenderness. There is no rebound and no guarding. Hernia confirmed negative in the right inguinal area and confirmed negative in the left inguinal area.  Genitourinary: Rectum normal, prostate normal, testes normal and penis normal. Rectal exam shows no external hemorrhoid, no internal hemorrhoid, no fissure, no mass, no tenderness and anal tone normal. Guaiac negative stool. Prostate is not enlarged and not tender. Right testis shows no mass, no swelling and no tenderness. Right testis is descended. Left testis shows no mass, no swelling and no tenderness. Left testis is descended. Uncircumcised. No phimosis, paraphimosis, hypospadias, penile erythema or penile tenderness. No discharge found.  Musculoskeletal:  Normal range of motion. He exhibits no edema and no tenderness.  Lymphadenopathy:  He has no cervical adenopathy.       Right: No inguinal adenopathy present.       Left: No inguinal adenopathy present.  Neurological: He is oriented to person, place, and time.  Skin: Skin is warm, dry and intact. Rash noted. No abrasion, no bruising, no burn, no ecchymosis, no laceration, no lesion, no petechiae and no purpura noted. Rash is macular. Rash is not papular, not maculopapular, not nodular, not pustular, not vesicular and not urticarial. He is not diaphoretic. No cyanosis or erythema. No pallor. Nails show no clubbing.          He has pink/healed macules and scabs over the left lower back and into his left flank and LLQ  Psychiatric: He has a normal mood and affect. His behavior is normal. Judgment and thought content normal.      Lab Results  Component Value Date   WBC 4.3* 11/30/2011   HGB 13.2 11/30/2011   HCT 39.8 11/30/2011   PLT 267.0 11/30/2011   GLUCOSE 75 11/30/2011   CHOL 108 06/08/2011   TRIG 38.0 06/08/2011   HDL 38.00* 06/08/2011   LDLCALC 62 06/08/2011   ALT 24 11/30/2011   AST 24 11/30/2011   NA 137 11/30/2011   K 3.9 11/30/2011   CL 101 11/30/2011   CREATININE 1.0 11/30/2011   BUN 12 11/30/2011   CO2 31 11/30/2011   TSH 1.89 08/17/2010   PSA 3.43 06/08/2011   HGBA1C 6.5 08/16/2011   MICROALBUR 1.4 02/12/2010      Assessment & Plan:

## 2011-12-13 NOTE — Assessment & Plan Note (Signed)
I will check his a1c today 

## 2011-12-13 NOTE — Assessment & Plan Note (Signed)
His pain persists despite antibiotics, I will check a CT scan of his abd and will look at his labs for pancreatitis, hepatitis, infection/stones, he wants to continue taking tramadol

## 2011-12-13 NOTE — Assessment & Plan Note (Signed)
This does not appear to be infected and it is not painful so no treatment was needed

## 2011-12-13 NOTE — Assessment & Plan Note (Signed)
I will recheck his UA today and have asked him to get a CT scan done to look for stones, mass

## 2011-12-13 NOTE — Patient Instructions (Signed)

## 2011-12-15 ENCOUNTER — Encounter: Payer: Self-pay | Admitting: Internal Medicine

## 2011-12-15 ENCOUNTER — Ambulatory Visit (INDEPENDENT_AMBULATORY_CARE_PROVIDER_SITE_OTHER)
Admission: RE | Admit: 2011-12-15 | Discharge: 2011-12-15 | Disposition: A | Discharge status: Home / Self Care | Payer: Medicare Other | Source: Ambulatory Visit | Attending: Internal Medicine | Admitting: Internal Medicine

## 2011-12-15 DIAGNOSIS — R319 Hematuria, unspecified: Secondary | ICD-10-CM

## 2011-12-15 DIAGNOSIS — N281 Cyst of kidney, acquired: Secondary | ICD-10-CM | POA: Diagnosis not present

## 2011-12-15 DIAGNOSIS — R1031 Right lower quadrant pain: Secondary | ICD-10-CM

## 2011-12-15 MED ORDER — IOHEXOL 300 MG/ML  SOLN
100.0000 mL | Freq: Once | INTRAMUSCULAR | Status: AC | PRN
  Administered 2011-12-15: 100 mL via INTRAVENOUS

## 2011-12-16 ENCOUNTER — Other Ambulatory Visit (HOSPITAL_COMMUNITY): Payer: Self-pay | Admitting: Urology

## 2011-12-16 DIAGNOSIS — C61 Malignant neoplasm of prostate: Secondary | ICD-10-CM

## 2011-12-16 DIAGNOSIS — N401 Enlarged prostate with lower urinary tract symptoms: Secondary | ICD-10-CM | POA: Diagnosis not present

## 2011-12-16 DIAGNOSIS — R3129 Other microscopic hematuria: Secondary | ICD-10-CM | POA: Diagnosis not present

## 2011-12-20 ENCOUNTER — Telehealth: Payer: Self-pay | Admitting: Internal Medicine

## 2011-12-20 NOTE — Telephone Encounter (Signed)
Needs appt tomorrow - any md

## 2011-12-20 NOTE — Telephone Encounter (Signed)
Caller: Monterrius/Patient; Patient Name: [name] [name]; PCP: Sanda Linger (Adults only); Best Callback Phone Number: [phone]; Reason for call:  abdominal pain.  States has had constipation and seemed to improve over weekend 12/18/11 but onset of new abdominal pain 12/19/11.   Last BM 12/20/11  but states he has been constipated and the stool was very difficult to pass.   Had blood in urine when seen in office last week, but states no blood in stool or urine currently.  Afebrile.  Per abdominal pain protocol, disposition Call Provider Immediately due to underlying diabetes; info to office for provider review/immediate appointment management/callback. May reach patient at [phone] home or cell [phone].

## 2011-12-21 ENCOUNTER — Ambulatory Visit (INDEPENDENT_AMBULATORY_CARE_PROVIDER_SITE_OTHER): Payer: Medicare Other | Admitting: Internal Medicine

## 2011-12-21 ENCOUNTER — Encounter: Payer: Self-pay | Admitting: Internal Medicine

## 2011-12-21 VITALS — BP 108/66 | HR 81 | Temp 97.7°F | Resp 16 | Wt 155.5 lb

## 2011-12-21 DIAGNOSIS — R972 Elevated prostate specific antigen [PSA]: Secondary | ICD-10-CM

## 2011-12-21 DIAGNOSIS — B0229 Other postherpetic nervous system involvement: Secondary | ICD-10-CM

## 2011-12-21 DIAGNOSIS — Z23 Encounter for immunization: Secondary | ICD-10-CM | POA: Diagnosis not present

## 2011-12-21 DIAGNOSIS — R1031 Right lower quadrant pain: Secondary | ICD-10-CM

## 2011-12-21 MED ORDER — PREGABALIN 50 MG PO CAPS: 50.0000 mg | ORAL_CAPSULE | Freq: Three times a day (TID) | ORAL | Status: DC

## 2011-12-21 NOTE — Telephone Encounter (Signed)
APPT TODAY WITH DR Yetta Barre

## 2011-12-21 NOTE — Patient Instructions (Signed)
Postherpetic Neuralgia  Shingles is a painful disease. It is caused by the herpes zoster virus. This is the same virus which also causes chickenpox. It can affect the torso, limbs, or the face. For most people, shingles is a condition of rather sudden onset. Pain usually lasts about 1 month.  In older patients, or patients with poor immune systems, a painful, long-standing (chronic) condition called postherpetic neuralgia can develop. This condition rarely happens before age 50. But at least 50% of people over 50 become affected following an attack of shingles. There is a natural tendency for this condition to improve over time with no treatment. Less than 5% of patients have pain that lasts for more than 1 year.  DIAGNOSIS   Herpes is usually easily diagnosed on physical exam. Pain sometimes follows when the skin sores (lesions) have disappeared. It is called postherpetic neuralgia. That name simply means the pain that follows herpes.  TREATMENT    Treating this condition may be difficult. Usually one of the tricyclic antidepressants, often amitriptyline, is the first line of treatment. There is evidence that the sooner these medications are given, the more likely they are to reduce pain.   Conventional analgesics, regional nerve blocks, and anticonvulsants have little benefit in most cases when used alone. Other tricyclic anti-depressants are used as a second option if the first antidepressant is unsuccessful.   Anticonvulsants, including carbamazepine, have been found to provide some added benefit when used with a tricyclic anti-depressant. This is especially for the stabbing type of pain similar to that of trigeminal neuralgia.   Chronic opioid therapy. This is a strong narcotic pain medication. It is used to treat pain that is resistant to other measures. The issues of dependency and tolerance can be reduced with closely managed care.   Some cream treatments are applied locally to the affected area. They  can help when used with other treatments. Their use may be difficult in the case of postherpetic trigeminal neuralgia. This is involved with the face. So the substances can irritate the eye and the skin around the eye. Examples of creams used include Capsaicin and lidocaine creams.   For shingles, antiviral therapies along with analgesics are recommended. Studies of the effect of anti-viral agents such as acyclovir on shingles have been done. They show improved rates of healing and decreased severity of sudden (acute) pain. Some observations suggest that nerve blocks during shingles infection will:   Reduce pain.   Shorten the acute episode.   Prevent the emergence of postherpetic neuralgia.  Viral medications used include Acyclovir (Zovirax), Valacyclovir, Famciclovir and a lysine diet.  Document Released: 06/18/2002 Document Revised: 03/17/2011 Document Reviewed: 03/28/2005  [employer] Patient Information 2012 [employer].

## 2011-12-22 NOTE — Assessment & Plan Note (Signed)
Await results of biopsy and bone scan

## 2011-12-22 NOTE — Progress Notes (Signed)
  Subjective:    Patient ID: [name] [name], male    DOB: 1934/09/09, 77 y.o.   MRN: [id number]  HPI  He returns today and now complains of a painful rash over the left side of his abd and flank area. The rash has healed.  Review of Systems  Constitutional: Negative for fever, chills, diaphoresis, activity change, appetite change, fatigue and unexpected weight change.  HENT: Negative.   Eyes: Negative.   Respiratory: Negative for cough, chest tightness, shortness of breath, wheezing and stridor.   Cardiovascular: Negative for chest pain, palpitations and leg swelling.  Gastrointestinal: Positive for abdominal pain. Negative for nausea, vomiting, diarrhea, constipation, blood in stool, abdominal distention, anal bleeding and rectal pain.  Genitourinary: Negative.   Musculoskeletal: Negative.   Skin: Positive for rash. Negative for color change, pallor and wound.  Neurological: Negative.   Hematological: Negative for adenopathy. Does not bruise/bleed easily.  Psychiatric/Behavioral: Negative.        Objective:   Physical Exam  Vitals reviewed. Constitutional: He appears well-developed and well-nourished.  Non-toxic appearance. He does not have a sickly appearance. He does not appear ill. No distress.  HENT:  Head: Normocephalic and atraumatic.  Mouth/Throat: No oropharyngeal exudate.  Eyes: Conjunctivae normal are normal. Right eye exhibits no discharge. Left eye exhibits no discharge. No scleral icterus.  Neck: Normal range of motion. Neck supple. No JVD present. No tracheal deviation present. No thyromegaly present.  Cardiovascular: Normal rate, regular rhythm, normal heart sounds and intact distal pulses.  Exam reveals no gallop and no friction rub.   No murmur heard. Pulmonary/Chest: Effort normal and breath sounds normal. No stridor. No respiratory distress. He has no wheezes. He has no rales. He exhibits no tenderness.  Abdominal: Soft. Bowel sounds are normal. He exhibits no  distension and no mass. There is no tenderness. There is no rebound and no guarding.  Musculoskeletal: Normal range of motion. He exhibits no edema and no tenderness.  Lymphadenopathy:    He has no cervical adenopathy.  Skin: Skin is warm and dry. Rash noted. No purpura noted. Rash is not macular, not papular, not nodular, not pustular, not vesicular and not urticarial. He is not diaphoretic. No erythema. No pallor.          He has PIPA over his left flank and abd but there are no vesicles, erythema, pustules, warmth, ttp, streaking  Psychiatric: He has a normal mood and affect. His behavior is normal. Judgment and thought content normal.          Assessment & Plan:

## 2011-12-22 NOTE — Assessment & Plan Note (Signed)
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

## 2011-12-25 ENCOUNTER — Emergency Department (HOSPITAL_COMMUNITY)
Admission: EM | Admit: 2011-12-25 | Discharge: 2011-12-25 | Disposition: A | Discharge status: Home / Self Care | Payer: Medicare Other | Attending: Emergency Medicine | Admitting: Emergency Medicine

## 2011-12-25 ENCOUNTER — Encounter (HOSPITAL_COMMUNITY): Payer: Self-pay

## 2011-12-25 DIAGNOSIS — Z87891 Personal history of nicotine dependence: Secondary | ICD-10-CM | POA: Insufficient documentation

## 2011-12-25 DIAGNOSIS — Z8042 Family history of malignant neoplasm of prostate: Secondary | ICD-10-CM | POA: Insufficient documentation

## 2011-12-25 DIAGNOSIS — K5900 Constipation, unspecified: Secondary | ICD-10-CM | POA: Insufficient documentation

## 2011-12-25 DIAGNOSIS — E119 Type 2 diabetes mellitus without complications: Secondary | ICD-10-CM | POA: Insufficient documentation

## 2011-12-25 DIAGNOSIS — Z833 Family history of diabetes mellitus: Secondary | ICD-10-CM | POA: Diagnosis not present

## 2011-12-25 DIAGNOSIS — B0229 Other postherpetic nervous system involvement: Secondary | ICD-10-CM | POA: Diagnosis not present

## 2011-12-25 DIAGNOSIS — Z8261 Family history of arthritis: Secondary | ICD-10-CM | POA: Insufficient documentation

## 2011-12-25 DIAGNOSIS — Z8249 Family history of ischemic heart disease and other diseases of the circulatory system: Secondary | ICD-10-CM | POA: Diagnosis not present

## 2011-12-25 DIAGNOSIS — I10 Essential (primary) hypertension: Secondary | ICD-10-CM | POA: Diagnosis not present

## 2011-12-25 DIAGNOSIS — R109 Unspecified abdominal pain: Secondary | ICD-10-CM | POA: Diagnosis not present

## 2011-12-25 HISTORY — DX: Essential (primary) hypertension: I10

## 2011-12-25 MED ORDER — POLYETHYLENE GLYCOL 3350 17 GM/SCOOP PO POWD: ORAL | Status: DC

## 2011-12-25 MED ORDER — HYDROCODONE-ACETAMINOPHEN 5-500 MG PO TABS: 1.0000 | ORAL_TABLET | Freq: Four times a day (QID) | ORAL | Status: DC | PRN

## 2011-12-25 NOTE — ED Provider Notes (Signed)
History     CSN: [id number]  Arrival date & time 12/25/11  1116   First MD Initiated Contact with Patient 12/25/11 1121      Chief Complaint  Patient presents with  . Herpes Zoster    (Consider location/radiation/quality/duration/timing/severity/associated sxs/prior treatment) HPI Comments: [name] [name] is a 77 y.o. Male who presents with complaint of a painful rash to the left flank. Pt was diagnosed with shingles about 3 wks ago by his doctor. Taking lyrica for the pain. Pt states it started as a pain to that area, then developed a blistering rash. Rash is now subsiding, but pain worsening. Pt states he is unable to sleep due to pain. Pt denies fever, chills, malaise. States no other complaints.    Past Medical History  Diagnosis Date  . Diabetes mellitus 1955    type 2  . Hyperlipidemia     5 yrs  . Osteoarthritis   . Diverticulosis 2009  . Hypertension     History reviewed. No pertinent past surgical history.  Family History  Problem Relation Age of Onset  . Heart disease Brother     questionable  . Arthritis Other   . Diabetes Other   . Cancer Other     prostate    History  Substance Use Topics  . Smoking status: Former Smoker    Types: Cigars    Quit date: 04/12/1995  . Smokeless tobacco: Never Used  . Alcohol Use: No      Review of Systems  Constitutional: Negative for fever, chills and fatigue.  HENT: Negative for neck pain and neck stiffness.   Respiratory: Negative.   Cardiovascular: Negative.   Gastrointestinal: Negative for nausea, vomiting, abdominal pain, diarrhea and constipation.  Genitourinary: Negative for flank pain, scrotal swelling, penile pain and testicular pain.  Musculoskeletal: Negative.   Skin: Positive for rash.  Neurological: Negative for weakness and numbness.    Allergies  Review of patient's allergies indicates no known allergies.  Home Medications   Current Outpatient Rx  Name Route Sig Dispense Refill  . CONTOUR  BLOOD GLUCOSE SYSTEM W/DEVICE KIT Does not apply by Does not apply route as directed.      . CELECOXIB 200 MG PO CAPS Oral Take 1 capsule (200 mg total) by mouth daily. 30 capsule 11  . GLYBURIDE-METFORMIN 2.5-500 MG PO TABS Oral Take 1 tablet by mouth 2 (two) times daily with a meal. 180 tablet 1  . PREGABALIN 50 MG PO CAPS Oral Take 1 capsule (50 mg total) by mouth 3 (three) times daily. 63 capsule 0  . RAMIPRIL 2.5 MG PO TABS Oral Take 1 tablet (2.5 mg total) by mouth daily. 30 tablet 11  . SIMVASTATIN 40 MG PO TABS  take 1 tablet by mouth once daily 30 tablet 11    BP 144/63  Pulse 84  Temp 98 F (36.7 C) (Oral)  Resp 16  SpO2 100%  Physical Exam  Nursing note and vitals reviewed. Constitutional: He is oriented to person, place, and time. He appears well-developed and well-nourished. No distress.  HENT:  Head: Normocephalic.  Eyes: Conjunctivae normal are normal.  Neck: Neck supple.  Cardiovascular: Normal rate, regular rhythm and normal heart sounds.   Pulmonary/Chest: Effort normal and breath sounds normal. No respiratory distress. He has no wheezes. He has no rales.  Abdominal: Soft. Bowel sounds are normal. He exhibits no distension. There is no tenderness. There is no rebound and no guarding.       There is a  rash extending from left lower back and wrapping to the left lower abdomen. Rash is hyperpigmented, composed of several small macules with scarring like appearance. Tender to the touch. No vescicles or papules noted.   Musculoskeletal: He exhibits no edema.  Neurological: He is alert and oriented to person, place, and time.  Skin: Skin is warm and dry. Rash noted.  Psychiatric: He has a normal mood and affect.    ED Course  Procedures (including critical care time)  Rash is consistent with herpes zoster or at this time more of post herpetic neuralgia. Pt on lyrica for this. It is too far out, 3 wks now, for acyclovir. Pt also complaining of some constipation. His  abdomen is soft, non tender. No N/V. Doubt acute abdomen at this time or SBO. Will try miralax. Will treat with pain medications for the rash and follow up.   1. Post herpetic neuralgia   2. Constipation       MDM          Lottie Mussel, PA 12/25/11 1207

## 2011-12-25 NOTE — ED Provider Notes (Signed)
Medical screening examination/treatment/procedure(s) were conducted as a shared visit with non-physician practitioner(s) and myself.  I personally evaluated the patient during the encounter On my exam he was in no distress.  Case well described by PA Josephina Gip, MD 12/25/11 [phone]

## 2011-12-25 NOTE — ED Notes (Signed)
AVW:[id number]<XB> Expected date:12/25/11<BR> Expected time:11:10 AM<BR> Means of arrival:Ambulance<BR> Comments:<BR> Shingle pain

## 2011-12-26 ENCOUNTER — Encounter: Payer: Self-pay | Admitting: Internal Medicine

## 2011-12-26 ENCOUNTER — Telehealth: Payer: Self-pay | Admitting: Internal Medicine

## 2011-12-26 ENCOUNTER — Ambulatory Visit (INDEPENDENT_AMBULATORY_CARE_PROVIDER_SITE_OTHER): Payer: Medicare Other | Admitting: Internal Medicine

## 2011-12-26 VITALS — BP 130/80 | HR 78 | Temp 97.7°F | Resp 16 | Wt 155.2 lb

## 2011-12-26 DIAGNOSIS — B0229 Other postherpetic nervous system involvement: Secondary | ICD-10-CM | POA: Diagnosis not present

## 2011-12-26 MED ORDER — OXYCODONE-ACETAMINOPHEN 7.5-325 MG PO TABS: 1.0000 | ORAL_TABLET | ORAL | Status: DC | PRN

## 2011-12-26 NOTE — Assessment & Plan Note (Signed)
He stopped lyrica b/c it did not help much, he was given hydrocodone on the ER yesterday and that helped some but he wants something even stronger so I wrote for percocet today

## 2011-12-26 NOTE — Telephone Encounter (Signed)
Caller: [name]/Patient; Patient Name: [name] [name]; PCP: Sanda Linger; Best Callback Phone Number: [phone]  Pt calling stating had OV for abd pain with rash  ~ 12/21/2011   and saw Dr. Kennedy Bucker and  he advised pain coming from his shingles diagnosed  12/13/2011. Has been having increased pain  and Tramadol and Lyrica wasn't helping , and had to go to ED on 12/25/2011. He was  given Hydrocodone  APAP 500 mg and told he might need antibiotic. The shingle rash has started to dry and having relief from the pain when he takes the Hydrocodone every 6 hours. RN triaged  and reached See in 24 hours Dispostion for painful lesion unrelieved with home care measures. Appointment scheduled today at 1015 with Dr. Yetta Barre.

## 2011-12-26 NOTE — Progress Notes (Signed)
  Subjective:    Patient ID: [name] [name], male    DOB: [birth year], 77 y.o.   MRN: [id number]  Rash This is a recurrent problem. The current episode started 1 to 4 weeks ago. The problem has been gradually worsening since onset. The affected locations include the torso. The rash is characterized by pain and peeling. Pertinent negatives include no anorexia, congestion, cough, diarrhea, eye pain, facial edema, fatigue, fever, joint pain, nail changes, rhinorrhea, shortness of breath, sore throat or vomiting. Treatments tried: lyrica and hydrocodone. The treatment provided mild relief. His past medical history is significant for varicella.      Review of Systems  Constitutional: Negative.  Negative for fever and fatigue.  HENT: Negative.  Negative for congestion, sore throat and rhinorrhea.   Eyes: Negative.  Negative for pain.  Respiratory: Negative.  Negative for cough and shortness of breath.   Cardiovascular: Negative for chest pain, palpitations and leg swelling.  Gastrointestinal: Negative for nausea, vomiting, abdominal pain, diarrhea, constipation, blood in stool and anorexia.  Genitourinary: Negative.   Musculoskeletal: Negative.  Negative for joint pain.  Skin: Positive for rash. Negative for nail changes, color change, pallor and wound.  Neurological: Negative.   Hematological: Negative for adenopathy. Does not bruise/bleed easily.  Psychiatric/Behavioral: Negative.        Objective:   Physical Exam  Vitals reviewed. Constitutional: He is oriented to person, place, and time. He appears well-nourished.  Non-toxic appearance. He does not have a sickly appearance. He does not appear ill. No distress.  HENT:  Head: Normocephalic and atraumatic.  Mouth/Throat: No oropharyngeal exudate.  Eyes: Conjunctivae normal are normal. Right eye exhibits no discharge. Left eye exhibits no discharge. No scleral icterus.  Neck: Normal range of motion. Neck supple. No JVD present. No tracheal  deviation present. No thyromegaly present.  Cardiovascular: Normal rate, regular rhythm, normal heart sounds and intact distal pulses.  Exam reveals no gallop and no friction rub.   No murmur heard. Pulmonary/Chest: Effort normal and breath sounds normal. No stridor. No respiratory distress. He has no wheezes. He has no rales. He exhibits no tenderness.  Abdominal: Soft. Bowel sounds are normal. He exhibits no distension and no mass. There is no tenderness. There is no rebound and no guarding.  Musculoskeletal: Normal range of motion. He exhibits no edema and no tenderness.  Lymphadenopathy:    He has no cervical adenopathy.  Neurological: He is oriented to person, place, and time.  Skin: Skin is warm, dry and intact. Rash noted. No abrasion, no bruising, no burn, no ecchymosis, no laceration, no lesion, no petechiae and no purpura noted. Rash is macular. Rash is not papular, not maculopapular, not nodular, not pustular, not vesicular and not urticarial. He is not diaphoretic. No cyanosis or erythema. No pallor. Nails show no clubbing.     Psychiatric: He has a normal mood and affect. His behavior is normal. Judgment and thought content normal.          Assessment & Plan:

## 2011-12-26 NOTE — Patient Instructions (Signed)
Postherpetic Neuralgia  Shingles is a painful disease. It is caused by the herpes zoster virus. This is the same virus which also causes chickenpox. It can affect the torso, limbs, or the face. For most people, shingles is a condition of rather sudden onset. Pain usually lasts about 1 month.  In older patients, or patients with poor immune systems, a painful, long-standing (chronic) condition called postherpetic neuralgia can develop. This condition rarely happens before age 50. But at least 50% of people over 50 become affected following an attack of shingles. There is a natural tendency for this condition to improve over time with no treatment. Less than 5% of patients have pain that lasts for more than 1 year.  DIAGNOSIS   Herpes is usually easily diagnosed on physical exam. Pain sometimes follows when the skin sores (lesions) have disappeared. It is called postherpetic neuralgia. That name simply means the pain that follows herpes.  TREATMENT    Treating this condition may be difficult. Usually one of the tricyclic antidepressants, often amitriptyline, is the first line of treatment. There is evidence that the sooner these medications are given, the more likely they are to reduce pain.   Conventional analgesics, regional nerve blocks, and anticonvulsants have little benefit in most cases when used alone. Other tricyclic anti-depressants are used as a second option if the first antidepressant is unsuccessful.   Anticonvulsants, including carbamazepine, have been found to provide some added benefit when used with a tricyclic anti-depressant. This is especially for the stabbing type of pain similar to that of trigeminal neuralgia.   Chronic opioid therapy. This is a strong narcotic pain medication. It is used to treat pain that is resistant to other measures. The issues of dependency and tolerance can be reduced with closely managed care.   Some cream treatments are applied locally to the affected area. They  can help when used with other treatments. Their use may be difficult in the case of postherpetic trigeminal neuralgia. This is involved with the face. So the substances can irritate the eye and the skin around the eye. Examples of creams used include Capsaicin and lidocaine creams.   For shingles, antiviral therapies along with analgesics are recommended. Studies of the effect of anti-viral agents such as acyclovir on shingles have been done. They show improved rates of healing and decreased severity of sudden (acute) pain. Some observations suggest that nerve blocks during shingles infection will:   Reduce pain.   Shorten the acute episode.   Prevent the emergence of postherpetic neuralgia.  Viral medications used include Acyclovir (Zovirax), Valacyclovir, Famciclovir and a lysine diet.  Document Released: 06/18/2002 Document Revised: 03/17/2011 Document Reviewed: 03/28/2005  [employer] Patient Information 2012 [employer].

## 2011-12-28 ENCOUNTER — Encounter (HOSPITAL_COMMUNITY)
Admission: RE | Admit: 2011-12-28 | Discharge: 2011-12-28 | Disposition: A | Discharge status: Home / Self Care | Payer: Medicare Other | Source: Ambulatory Visit | Attending: Urology | Admitting: Urology

## 2011-12-28 DIAGNOSIS — C61 Malignant neoplasm of prostate: Secondary | ICD-10-CM

## 2011-12-28 DIAGNOSIS — Z8546 Personal history of malignant neoplasm of prostate: Secondary | ICD-10-CM | POA: Diagnosis not present

## 2011-12-28 MED ORDER — TECHNETIUM TC 99M MEDRONATE IV KIT
25.0000 | PACK | Freq: Once | INTRAVENOUS | Status: AC | PRN
  Administered 2011-12-28: 25 via INTRAVENOUS

## 2012-01-02 ENCOUNTER — Ambulatory Visit: Payer: Medicare Other | Admitting: Internal Medicine

## 2012-01-11 DIAGNOSIS — N401 Enlarged prostate with lower urinary tract symptoms: Secondary | ICD-10-CM | POA: Diagnosis not present

## 2012-01-11 DIAGNOSIS — R3129 Other microscopic hematuria: Secondary | ICD-10-CM | POA: Diagnosis not present

## 2012-01-11 DIAGNOSIS — C61 Malignant neoplasm of prostate: Secondary | ICD-10-CM | POA: Diagnosis not present

## 2012-02-01 ENCOUNTER — Telehealth: Payer: Self-pay | Admitting: Internal Medicine

## 2012-02-01 ENCOUNTER — Ambulatory Visit (INDEPENDENT_AMBULATORY_CARE_PROVIDER_SITE_OTHER): Payer: Medicare Other | Admitting: Internal Medicine

## 2012-02-01 ENCOUNTER — Encounter: Payer: Self-pay | Admitting: Internal Medicine

## 2012-02-01 VITALS — BP 114/70 | HR 99 | Temp 99.5°F | Resp 16 | Wt 145.0 lb

## 2012-02-01 DIAGNOSIS — B0229 Other postherpetic nervous system involvement: Secondary | ICD-10-CM

## 2012-02-01 MED ORDER — OXYCODONE-ACETAMINOPHEN 7.5-500 MG PO TABS: 1.0000 | ORAL_TABLET | Freq: Three times a day (TID) | ORAL | Status: DC | PRN

## 2012-02-01 MED ORDER — GABAPENTIN 300 MG PO CAPS: 600.0000 mg | ORAL_CAPSULE | Freq: Three times a day (TID) | ORAL | Status: DC

## 2012-02-01 MED ORDER — LIDOCAINE 5 % EX PTCH: 1.0000 | MEDICATED_PATCH | CUTANEOUS | Status: DC

## 2012-02-01 NOTE — Progress Notes (Signed)
Subjective:    Patient ID: [name] [name], male    DOB: 03-27-1935, 77 y.o.   MRN: [id number]  HPI [name] [name] presents for persistent pain in the left flank and groin. He was diagnosed with Zoster early September, was seen Sept 11th and stated on lyrica, was seen Sept 15th ED and was given hydrocodone, was seen Sept 16th and was started on oxycodone 7.5 mg which did help. He continues to have pain that limits his activities.  He complains ofd 15 lbs weight loss since August. He is being treated for prostate cancer.   Past Medical History  Diagnosis Date  . Diabetes mellitus 1955    type 2  . Hyperlipidemia     5 yrs  . Osteoarthritis   . Diverticulosis 2009  . Hypertension    No past surgical history on file. Family History  Problem Relation Age of Onset  . Heart disease Brother     questionable  . Arthritis Other   . Diabetes Other   . Cancer Other     prostate   History   Social History  . Marital Status: Widowed    Spouse Name: N/A    Number of Children: N/A  . Years of Education: N/A   Occupational History  . Not on file.   Social History Main Topics  . Smoking status: Former Smoker    Types: Cigars    Quit date: 04/12/1995  . Smokeless tobacco: Never Used  . Alcohol Use: No  . Drug Use: No  . Sexually Active: Not Currently   Other Topics Concern  . Not on file   Social History Narrative  . No narrative on file    Current Outpatient Prescriptions on File Prior to Visit  Medication Sig Dispense Refill  . Blood Glucose Monitoring Suppl (CONTOUR BLOOD GLUCOSE SYSTEM) W/DEVICE KIT by Does not apply route as directed.        . celecoxib (CELEBREX) 200 MG capsule Take 1 capsule (200 mg total) by mouth daily.  30 capsule  11  . glyBURIDE-metformin (GLUCOVANCE) 2.5-500 MG per tablet Take 1 tablet by mouth 2 (two) times daily with a meal.  180 tablet  1  . oxyCODONE-acetaminophen (PERCOCET) 7.5-325 MG per tablet Take 1 tablet by mouth every 4 (four) hours as needed  for pain.  50 tablet  0  . polyethylene glycol powder (GLYCOLAX/MIRALAX) powder 17g dissolved in a glass of water dailly  255 g  0  . ramipril (ALTACE) 2.5 MG tablet Take 1 tablet (2.5 mg total) by mouth daily.  30 tablet  11  . simvastatin (ZOCOR) 40 MG tablet take 1 tablet by mouth once daily  30 tablet  11  . gabapentin (NEURONTIN) 300 MG capsule Take 2 capsules (600 mg total) by mouth 3 (three) times daily. Work you way up to this dose as instructed.  180 capsule  3      Review of Systems System review is negative for any constitutional, cardiac, pulmonary, GI or neuro symptoms or complaints other than as described in the HPI.     Objective:   Physical Exam Filed Vitals:   02/01/12 1134  BP: 114/70  Pulse: 99  Temp: 99.5 F (37.5 C)  Resp: 16   Wt Readings from Last 3 Encounters:  02/01/12 145 lb (65.772 kg)  12/26/11 155 lb 4 oz (70.421 kg)  12/21/11 155 lb 8 oz (70.534 kg)   Gen'l- slender AA man in no acute distress Cor- RRR Pulm - normal  respirations Neuor - A&O x 3 Derm - macular dark rash along left flank to mid-line abdomen w/o open lesions c/w resolved zoster-fairly extensive outbreak.       Assessment & Plan:

## 2012-02-01 NOTE — Patient Instructions (Addendum)
Post Herpetic Neuralgia - inflammation of the nerves down which the shingles virus traveled to cause the skin infection. This can be a prolonged problem of pain and discomfort.  Plan Gabapentin 300 mg: take 1 tablet today; take 1 tablet twice a day Thursday, Friday, Saturday; take 1 tablet three times a day Sunday, Monday, Tuesday; take 2 tablets three times a day starting Wednesday and continue. The medication can make you drowsy as you start it but this should clear up.  Take Percocet 7.5/500 4 times a day as needed for uncontrolled pain  Apply a lidoderm patch to the most painful area: on 12 hours, off 12 hours.    Postherpetic Neuralgia Shingles is a painful disease. It is caused by the herpes zoster virus. This is the same virus which also causes chickenpox. It can affect the torso, limbs, or the face. For most people, shingles is a condition of rather sudden onset. Pain usually lasts about 1 month. In older patients, or patients with poor immune systems, a painful, long-standing (chronic) condition called postherpetic neuralgia can develop. This condition rarely happens before age 37. But at least 50% of people over 50 become affected following an attack of shingles. There is a natural tendency for this condition to improve over time with no treatment. Less than 5% of patients have pain that lasts for more than 1 year. DIAGNOSIS   Herpes is usually easily diagnosed on physical exam. Pain sometimes follows when the skin sores (lesions) have disappeared. It is called postherpetic neuralgia. That name simply means the pain that follows herpes. TREATMENT    Treating this condition may be difficult. Usually one of the tricyclic antidepressants, often amitriptyline, is the first line of treatment. There is evidence that the sooner these medications are given, the more likely they are to reduce pain.   Conventional analgesics, regional nerve blocks, and anticonvulsants have little benefit in most  cases when used alone. Other tricyclic anti-depressants are used as a second option if the first antidepressant is unsuccessful.   Anticonvulsants, including carbamazepine, have been found to provide some added benefit when used with a tricyclic anti-depressant. This is especially for the stabbing type of pain similar to that of trigeminal neuralgia.   Chronic opioid therapy. This is a strong narcotic pain medication. It is used to treat pain that is resistant to other measures. The issues of dependency and tolerance can be reduced with closely managed care.   Some cream treatments are applied locally to the affected area. They can help when used with other treatments. Their use may be difficult in the case of postherpetic trigeminal neuralgia. This is involved with the face. So the substances can irritate the eye and the skin around the eye. Examples of creams used include Capsaicin and lidocaine creams.   For shingles, antiviral therapies along with analgesics are recommended. Studies of the effect of anti-viral agents such as acyclovir on shingles have been done. They show improved rates of healing and decreased severity of sudden (acute) pain. Some observations suggest that nerve blocks during shingles infection will:   Reduce pain.   Shorten the acute episode.   Prevent the emergence of postherpetic neuralgia.  Viral medications used include Acyclovir (Zovirax), Valacyclovir, Famciclovir and a lysine diet. Document Released: 06/18/2002 Document Revised: 06/20/2011 Document Reviewed: 03/28/2005 [facility] Patient Information 2013 [place], [place].

## 2012-02-01 NOTE — Telephone Encounter (Signed)
Caller: Kalen/Patient; Patient Name: [name] [name]; PCP: Sanda Linger (Adults only); Best Callback Phone Number: [phone].  Onset symptoms approx  12/01/11 came to office for swelling, itch on side.  Rx rec'd for antibiotics and pain medications; relates he was diagnosed with Shingles.  Symptoms persist.  On left side he has pain, but no rash.  Using Solarcaine for itching.  Reports itching interferes with sleep and he cannot stand the itching or to wear a belt around his waist due to the discomfort.  Appointment with Dr. Debby Bud at 11:15 as none available with Dr. Yetta Barre.  Caller agreed.  Home care for the interim and parameters for callback given.  Skin Lesions protocol used.

## 2012-02-02 NOTE — Assessment & Plan Note (Signed)
Post Herpetic Neuralgia - inflammation of the nerves down which the shingles virus traveled to cause the skin infection. This can be a prolonged problem of pain and discomfort.  Plan Gabapentin 300 mg: take 1 tablet today; take 1 tablet twice a day Thursday, Friday, Saturday; take 1 tablet three times a day Sunday, Monday, Tuesday; take 2 tablets three times a day starting Wednesday and continue. The medication can make you drowsy as you start it but this should clear up.  Take Percocet 7.5/500 4 times a day as needed for uncontrolled pain  Apply a lidoderm patch to the most painful area: on 12 hours, off 12 hours.

## 2012-02-15 DIAGNOSIS — Z0000 Encounter for general adult medical examination without abnormal findings: Secondary | ICD-10-CM | POA: Diagnosis not present

## 2012-02-15 DIAGNOSIS — I10 Essential (primary) hypertension: Secondary | ICD-10-CM | POA: Diagnosis not present

## 2012-02-15 DIAGNOSIS — E119 Type 2 diabetes mellitus without complications: Secondary | ICD-10-CM | POA: Diagnosis not present

## 2012-02-15 DIAGNOSIS — IMO0002 Reserved for concepts with insufficient information to code with codable children: Secondary | ICD-10-CM | POA: Diagnosis not present

## 2012-02-15 DIAGNOSIS — R319 Hematuria, unspecified: Secondary | ICD-10-CM | POA: Diagnosis not present

## 2012-02-15 DIAGNOSIS — E785 Hyperlipidemia, unspecified: Secondary | ICD-10-CM | POA: Diagnosis not present

## 2012-02-29 DIAGNOSIS — E785 Hyperlipidemia, unspecified: Secondary | ICD-10-CM | POA: Diagnosis not present

## 2012-02-29 DIAGNOSIS — E1159 Type 2 diabetes mellitus with other circulatory complications: Secondary | ICD-10-CM | POA: Diagnosis not present

## 2012-02-29 DIAGNOSIS — I10 Essential (primary) hypertension: Secondary | ICD-10-CM | POA: Diagnosis not present

## 2012-03-14 DIAGNOSIS — R319 Hematuria, unspecified: Secondary | ICD-10-CM | POA: Diagnosis not present

## 2012-03-14 DIAGNOSIS — IMO0002 Reserved for concepts with insufficient information to code with codable children: Secondary | ICD-10-CM | POA: Diagnosis not present

## 2012-03-14 DIAGNOSIS — E119 Type 2 diabetes mellitus without complications: Secondary | ICD-10-CM | POA: Diagnosis not present

## 2012-03-14 DIAGNOSIS — I10 Essential (primary) hypertension: Secondary | ICD-10-CM | POA: Diagnosis not present

## 2012-03-14 DIAGNOSIS — E559 Vitamin D deficiency, unspecified: Secondary | ICD-10-CM | POA: Diagnosis not present

## 2012-03-14 DIAGNOSIS — E785 Hyperlipidemia, unspecified: Secondary | ICD-10-CM | POA: Diagnosis not present

## 2012-04-16 DIAGNOSIS — I10 Essential (primary) hypertension: Secondary | ICD-10-CM | POA: Diagnosis not present

## 2012-04-16 DIAGNOSIS — IMO0002 Reserved for concepts with insufficient information to code with codable children: Secondary | ICD-10-CM | POA: Diagnosis not present

## 2012-04-16 DIAGNOSIS — E119 Type 2 diabetes mellitus without complications: Secondary | ICD-10-CM | POA: Diagnosis not present

## 2012-04-16 DIAGNOSIS — E785 Hyperlipidemia, unspecified: Secondary | ICD-10-CM | POA: Diagnosis not present

## 2012-04-16 DIAGNOSIS — E559 Vitamin D deficiency, unspecified: Secondary | ICD-10-CM | POA: Diagnosis not present

## 2012-04-16 DIAGNOSIS — R319 Hematuria, unspecified: Secondary | ICD-10-CM | POA: Diagnosis not present

## 2012-05-03 ENCOUNTER — Telehealth: Payer: Self-pay

## 2012-05-03 NOTE — Telephone Encounter (Signed)
[employer] supplier called stating that they are trying to pt knee brace covered for patient and will need medical records to support a reason why. I advise that they will need to submit a medical release to obtain that information. She stated that she will notify pt that he need to sign consent to release info. Thanks

## 2012-05-08 DIAGNOSIS — C61 Malignant neoplasm of prostate: Secondary | ICD-10-CM | POA: Diagnosis not present

## 2012-05-15 DIAGNOSIS — I10 Essential (primary) hypertension: Secondary | ICD-10-CM | POA: Diagnosis not present

## 2012-05-15 DIAGNOSIS — IMO0002 Reserved for concepts with insufficient information to code with codable children: Secondary | ICD-10-CM | POA: Diagnosis not present

## 2012-05-15 DIAGNOSIS — R319 Hematuria, unspecified: Secondary | ICD-10-CM | POA: Diagnosis not present

## 2012-05-15 DIAGNOSIS — E785 Hyperlipidemia, unspecified: Secondary | ICD-10-CM | POA: Diagnosis not present

## 2012-05-15 DIAGNOSIS — E119 Type 2 diabetes mellitus without complications: Secondary | ICD-10-CM | POA: Diagnosis not present

## 2012-05-15 DIAGNOSIS — K3189 Other diseases of stomach and duodenum: Secondary | ICD-10-CM | POA: Diagnosis not present

## 2012-05-15 DIAGNOSIS — E559 Vitamin D deficiency, unspecified: Secondary | ICD-10-CM | POA: Diagnosis not present

## 2012-05-29 DIAGNOSIS — R109 Unspecified abdominal pain: Secondary | ICD-10-CM | POA: Diagnosis not present

## 2012-06-05 ENCOUNTER — Other Ambulatory Visit: Payer: Self-pay | Admitting: Internal Medicine

## 2012-06-05 DIAGNOSIS — R1013 Epigastric pain: Secondary | ICD-10-CM | POA: Diagnosis not present

## 2012-06-14 ENCOUNTER — Other Ambulatory Visit: Payer: Self-pay

## 2012-06-14 DIAGNOSIS — E1165 Type 2 diabetes mellitus with hyperglycemia: Secondary | ICD-10-CM

## 2012-06-14 MED ORDER — GLYBURIDE-METFORMIN 2.5-500 MG PO TABS: 1.0000 | ORAL_TABLET | Freq: Two times a day (BID) | ORAL | Status: DC

## 2012-06-20 DIAGNOSIS — E785 Hyperlipidemia, unspecified: Secondary | ICD-10-CM | POA: Diagnosis not present

## 2012-06-20 DIAGNOSIS — I10 Essential (primary) hypertension: Secondary | ICD-10-CM | POA: Diagnosis not present

## 2012-06-20 DIAGNOSIS — R319 Hematuria, unspecified: Secondary | ICD-10-CM | POA: Diagnosis not present

## 2012-06-20 DIAGNOSIS — E559 Vitamin D deficiency, unspecified: Secondary | ICD-10-CM | POA: Diagnosis not present

## 2012-06-20 DIAGNOSIS — K3189 Other diseases of stomach and duodenum: Secondary | ICD-10-CM | POA: Diagnosis not present

## 2012-06-20 DIAGNOSIS — R1013 Epigastric pain: Secondary | ICD-10-CM | POA: Diagnosis not present

## 2012-06-20 DIAGNOSIS — IMO0002 Reserved for concepts with insufficient information to code with codable children: Secondary | ICD-10-CM | POA: Diagnosis not present

## 2012-06-20 DIAGNOSIS — E119 Type 2 diabetes mellitus without complications: Secondary | ICD-10-CM | POA: Diagnosis not present

## 2012-06-22 DIAGNOSIS — I10 Essential (primary) hypertension: Secondary | ICD-10-CM | POA: Diagnosis not present

## 2012-06-22 DIAGNOSIS — R9431 Abnormal electrocardiogram [ECG] [EKG]: Secondary | ICD-10-CM | POA: Diagnosis not present

## 2012-06-22 DIAGNOSIS — I119 Hypertensive heart disease without heart failure: Secondary | ICD-10-CM | POA: Diagnosis not present

## 2012-06-28 DIAGNOSIS — C61 Malignant neoplasm of prostate: Secondary | ICD-10-CM | POA: Diagnosis not present

## 2012-06-28 DIAGNOSIS — R3129 Other microscopic hematuria: Secondary | ICD-10-CM | POA: Diagnosis not present

## 2012-07-06 DIAGNOSIS — R1013 Epigastric pain: Secondary | ICD-10-CM | POA: Diagnosis not present

## 2012-07-06 DIAGNOSIS — I10 Essential (primary) hypertension: Secondary | ICD-10-CM | POA: Diagnosis not present

## 2012-07-06 DIAGNOSIS — E785 Hyperlipidemia, unspecified: Secondary | ICD-10-CM | POA: Diagnosis not present

## 2012-07-06 DIAGNOSIS — IMO0002 Reserved for concepts with insufficient information to code with codable children: Secondary | ICD-10-CM | POA: Diagnosis not present

## 2012-07-06 DIAGNOSIS — I119 Hypertensive heart disease without heart failure: Secondary | ICD-10-CM | POA: Diagnosis not present

## 2012-07-06 DIAGNOSIS — R319 Hematuria, unspecified: Secondary | ICD-10-CM | POA: Diagnosis not present

## 2012-07-06 DIAGNOSIS — K3189 Other diseases of stomach and duodenum: Secondary | ICD-10-CM | POA: Diagnosis not present

## 2012-07-06 DIAGNOSIS — E559 Vitamin D deficiency, unspecified: Secondary | ICD-10-CM | POA: Diagnosis not present

## 2012-07-06 DIAGNOSIS — E119 Type 2 diabetes mellitus without complications: Secondary | ICD-10-CM | POA: Diagnosis not present

## 2012-07-26 DIAGNOSIS — E785 Hyperlipidemia, unspecified: Secondary | ICD-10-CM | POA: Diagnosis not present

## 2012-07-26 DIAGNOSIS — I119 Hypertensive heart disease without heart failure: Secondary | ICD-10-CM | POA: Diagnosis not present

## 2012-08-20 DIAGNOSIS — E119 Type 2 diabetes mellitus without complications: Secondary | ICD-10-CM | POA: Diagnosis not present

## 2012-08-20 DIAGNOSIS — R319 Hematuria, unspecified: Secondary | ICD-10-CM | POA: Diagnosis not present

## 2012-08-20 DIAGNOSIS — IMO0002 Reserved for concepts with insufficient information to code with codable children: Secondary | ICD-10-CM | POA: Diagnosis not present

## 2012-08-20 DIAGNOSIS — E559 Vitamin D deficiency, unspecified: Secondary | ICD-10-CM | POA: Diagnosis not present

## 2012-08-20 DIAGNOSIS — I10 Essential (primary) hypertension: Secondary | ICD-10-CM | POA: Diagnosis not present

## 2012-08-20 DIAGNOSIS — R1013 Epigastric pain: Secondary | ICD-10-CM | POA: Diagnosis not present

## 2012-08-20 DIAGNOSIS — I119 Hypertensive heart disease without heart failure: Secondary | ICD-10-CM | POA: Diagnosis not present

## 2012-08-20 DIAGNOSIS — E785 Hyperlipidemia, unspecified: Secondary | ICD-10-CM | POA: Diagnosis not present

## 2012-08-20 DIAGNOSIS — K3189 Other diseases of stomach and duodenum: Secondary | ICD-10-CM | POA: Diagnosis not present

## 2012-08-22 DIAGNOSIS — I10 Essential (primary) hypertension: Secondary | ICD-10-CM | POA: Diagnosis not present

## 2012-08-22 DIAGNOSIS — R0609 Other forms of dyspnea: Secondary | ICD-10-CM | POA: Diagnosis not present

## 2012-08-22 DIAGNOSIS — E119 Type 2 diabetes mellitus without complications: Secondary | ICD-10-CM | POA: Diagnosis not present

## 2012-09-24 DIAGNOSIS — E119 Type 2 diabetes mellitus without complications: Secondary | ICD-10-CM | POA: Diagnosis not present

## 2012-09-24 DIAGNOSIS — E1159 Type 2 diabetes mellitus with other circulatory complications: Secondary | ICD-10-CM | POA: Diagnosis not present

## 2012-09-24 DIAGNOSIS — Z125 Encounter for screening for malignant neoplasm of prostate: Secondary | ICD-10-CM | POA: Diagnosis not present

## 2012-09-24 DIAGNOSIS — I119 Hypertensive heart disease without heart failure: Secondary | ICD-10-CM | POA: Diagnosis not present

## 2012-09-24 DIAGNOSIS — R869 Unspecified abnormal finding in specimens from male genital organs: Secondary | ICD-10-CM | POA: Diagnosis not present

## 2012-09-24 DIAGNOSIS — E559 Vitamin D deficiency, unspecified: Secondary | ICD-10-CM | POA: Diagnosis not present

## 2012-10-02 DIAGNOSIS — M25569 Pain in unspecified knee: Secondary | ICD-10-CM | POA: Diagnosis not present

## 2012-10-02 DIAGNOSIS — E785 Hyperlipidemia, unspecified: Secondary | ICD-10-CM | POA: Diagnosis not present

## 2012-10-02 DIAGNOSIS — Z79899 Other long term (current) drug therapy: Secondary | ICD-10-CM | POA: Diagnosis not present

## 2012-10-02 DIAGNOSIS — E559 Vitamin D deficiency, unspecified: Secondary | ICD-10-CM | POA: Diagnosis not present

## 2012-10-02 DIAGNOSIS — IMO0002 Reserved for concepts with insufficient information to code with codable children: Secondary | ICD-10-CM | POA: Diagnosis not present

## 2012-10-02 DIAGNOSIS — E119 Type 2 diabetes mellitus without complications: Secondary | ICD-10-CM | POA: Diagnosis not present

## 2012-10-02 DIAGNOSIS — I119 Hypertensive heart disease without heart failure: Secondary | ICD-10-CM | POA: Diagnosis not present

## 2012-10-02 DIAGNOSIS — I10 Essential (primary) hypertension: Secondary | ICD-10-CM | POA: Diagnosis not present

## 2012-10-02 DIAGNOSIS — K3189 Other diseases of stomach and duodenum: Secondary | ICD-10-CM | POA: Diagnosis not present

## 2012-10-08 DIAGNOSIS — N401 Enlarged prostate with lower urinary tract symptoms: Secondary | ICD-10-CM | POA: Diagnosis not present

## 2012-10-17 DIAGNOSIS — C61 Malignant neoplasm of prostate: Secondary | ICD-10-CM | POA: Diagnosis not present

## 2012-10-17 DIAGNOSIS — N401 Enlarged prostate with lower urinary tract symptoms: Secondary | ICD-10-CM | POA: Diagnosis not present

## 2012-11-13 DIAGNOSIS — E785 Hyperlipidemia, unspecified: Secondary | ICD-10-CM | POA: Diagnosis not present

## 2012-11-13 DIAGNOSIS — IMO0002 Reserved for concepts with insufficient information to code with codable children: Secondary | ICD-10-CM | POA: Diagnosis not present

## 2012-11-13 DIAGNOSIS — I119 Hypertensive heart disease without heart failure: Secondary | ICD-10-CM | POA: Diagnosis not present

## 2012-11-13 DIAGNOSIS — M1710 Unilateral primary osteoarthritis, unspecified knee: Secondary | ICD-10-CM | POA: Diagnosis not present

## 2012-11-13 DIAGNOSIS — E119 Type 2 diabetes mellitus without complications: Secondary | ICD-10-CM | POA: Diagnosis not present

## 2012-11-13 DIAGNOSIS — E559 Vitamin D deficiency, unspecified: Secondary | ICD-10-CM | POA: Diagnosis not present

## 2012-11-13 DIAGNOSIS — I10 Essential (primary) hypertension: Secondary | ICD-10-CM | POA: Diagnosis not present

## 2012-11-13 DIAGNOSIS — R1013 Epigastric pain: Secondary | ICD-10-CM | POA: Diagnosis not present

## 2012-12-17 DIAGNOSIS — E119 Type 2 diabetes mellitus without complications: Secondary | ICD-10-CM | POA: Diagnosis not present

## 2012-12-17 DIAGNOSIS — IMO0002 Reserved for concepts with insufficient information to code with codable children: Secondary | ICD-10-CM | POA: Diagnosis not present

## 2012-12-17 DIAGNOSIS — E785 Hyperlipidemia, unspecified: Secondary | ICD-10-CM | POA: Diagnosis not present

## 2012-12-17 DIAGNOSIS — E559 Vitamin D deficiency, unspecified: Secondary | ICD-10-CM | POA: Diagnosis not present

## 2012-12-17 DIAGNOSIS — E1159 Type 2 diabetes mellitus with other circulatory complications: Secondary | ICD-10-CM | POA: Diagnosis not present

## 2012-12-17 DIAGNOSIS — I10 Essential (primary) hypertension: Secondary | ICD-10-CM | POA: Diagnosis not present

## 2012-12-18 DIAGNOSIS — I119 Hypertensive heart disease without heart failure: Secondary | ICD-10-CM | POA: Diagnosis not present

## 2012-12-18 DIAGNOSIS — M1710 Unilateral primary osteoarthritis, unspecified knee: Secondary | ICD-10-CM | POA: Diagnosis not present

## 2012-12-18 DIAGNOSIS — E559 Vitamin D deficiency, unspecified: Secondary | ICD-10-CM | POA: Diagnosis not present

## 2012-12-18 DIAGNOSIS — K3189 Other diseases of stomach and duodenum: Secondary | ICD-10-CM | POA: Diagnosis not present

## 2012-12-18 DIAGNOSIS — Z23 Encounter for immunization: Secondary | ICD-10-CM | POA: Diagnosis not present

## 2012-12-18 DIAGNOSIS — IMO0002 Reserved for concepts with insufficient information to code with codable children: Secondary | ICD-10-CM | POA: Diagnosis not present

## 2012-12-18 DIAGNOSIS — I10 Essential (primary) hypertension: Secondary | ICD-10-CM | POA: Diagnosis not present

## 2012-12-18 DIAGNOSIS — E119 Type 2 diabetes mellitus without complications: Secondary | ICD-10-CM | POA: Diagnosis not present

## 2013-02-12 DIAGNOSIS — E119 Type 2 diabetes mellitus without complications: Secondary | ICD-10-CM | POA: Diagnosis not present

## 2013-02-12 DIAGNOSIS — M545 Low back pain, unspecified: Secondary | ICD-10-CM | POA: Diagnosis not present

## 2013-02-12 DIAGNOSIS — IMO0002 Reserved for concepts with insufficient information to code with codable children: Secondary | ICD-10-CM | POA: Diagnosis not present

## 2013-02-12 DIAGNOSIS — I119 Hypertensive heart disease without heart failure: Secondary | ICD-10-CM | POA: Diagnosis not present

## 2013-02-12 DIAGNOSIS — I10 Essential (primary) hypertension: Secondary | ICD-10-CM | POA: Diagnosis not present

## 2013-02-12 DIAGNOSIS — K3189 Other diseases of stomach and duodenum: Secondary | ICD-10-CM | POA: Diagnosis not present

## 2013-02-12 DIAGNOSIS — G4700 Insomnia, unspecified: Secondary | ICD-10-CM | POA: Diagnosis not present

## 2013-02-12 DIAGNOSIS — M1710 Unilateral primary osteoarthritis, unspecified knee: Secondary | ICD-10-CM | POA: Diagnosis not present

## 2013-03-18 DIAGNOSIS — K3189 Other diseases of stomach and duodenum: Secondary | ICD-10-CM | POA: Diagnosis not present

## 2013-03-18 DIAGNOSIS — M545 Low back pain: Secondary | ICD-10-CM | POA: Diagnosis not present

## 2013-03-18 DIAGNOSIS — M1710 Unilateral primary osteoarthritis, unspecified knee: Secondary | ICD-10-CM | POA: Diagnosis not present

## 2013-03-18 DIAGNOSIS — G4700 Insomnia, unspecified: Secondary | ICD-10-CM | POA: Diagnosis not present

## 2013-03-18 DIAGNOSIS — I10 Essential (primary) hypertension: Secondary | ICD-10-CM | POA: Diagnosis not present

## 2013-03-18 DIAGNOSIS — I119 Hypertensive heart disease without heart failure: Secondary | ICD-10-CM | POA: Diagnosis not present

## 2013-03-18 DIAGNOSIS — E119 Type 2 diabetes mellitus without complications: Secondary | ICD-10-CM | POA: Diagnosis not present

## 2013-03-18 DIAGNOSIS — IMO0002 Reserved for concepts with insufficient information to code with codable children: Secondary | ICD-10-CM | POA: Diagnosis not present

## 2013-04-30 DIAGNOSIS — E119 Type 2 diabetes mellitus without complications: Secondary | ICD-10-CM | POA: Diagnosis not present

## 2013-04-30 DIAGNOSIS — M545 Low back pain, unspecified: Secondary | ICD-10-CM | POA: Diagnosis not present

## 2013-04-30 DIAGNOSIS — G4700 Insomnia, unspecified: Secondary | ICD-10-CM | POA: Diagnosis not present

## 2013-04-30 DIAGNOSIS — I119 Hypertensive heart disease without heart failure: Secondary | ICD-10-CM | POA: Diagnosis not present

## 2013-04-30 DIAGNOSIS — R1013 Epigastric pain: Secondary | ICD-10-CM | POA: Diagnosis not present

## 2013-04-30 DIAGNOSIS — I10 Essential (primary) hypertension: Secondary | ICD-10-CM | POA: Diagnosis not present

## 2013-04-30 DIAGNOSIS — K3189 Other diseases of stomach and duodenum: Secondary | ICD-10-CM | POA: Diagnosis not present

## 2013-04-30 DIAGNOSIS — IMO0002 Reserved for concepts with insufficient information to code with codable children: Secondary | ICD-10-CM | POA: Diagnosis not present

## 2013-04-30 DIAGNOSIS — M1710 Unilateral primary osteoarthritis, unspecified knee: Secondary | ICD-10-CM | POA: Diagnosis not present

## 2013-05-02 DIAGNOSIS — C61 Malignant neoplasm of prostate: Secondary | ICD-10-CM | POA: Diagnosis not present

## 2013-05-08 DIAGNOSIS — C61 Malignant neoplasm of prostate: Secondary | ICD-10-CM | POA: Diagnosis not present

## 2013-05-08 DIAGNOSIS — N401 Enlarged prostate with lower urinary tract symptoms: Secondary | ICD-10-CM | POA: Diagnosis not present

## 2013-05-25 ENCOUNTER — Other Ambulatory Visit: Payer: Self-pay | Admitting: Internal Medicine

## 2013-06-11 DIAGNOSIS — G4700 Insomnia, unspecified: Secondary | ICD-10-CM | POA: Diagnosis not present

## 2013-06-11 DIAGNOSIS — I119 Hypertensive heart disease without heart failure: Secondary | ICD-10-CM | POA: Diagnosis not present

## 2013-06-11 DIAGNOSIS — M545 Low back pain, unspecified: Secondary | ICD-10-CM | POA: Diagnosis not present

## 2013-06-11 DIAGNOSIS — M1710 Unilateral primary osteoarthritis, unspecified knee: Secondary | ICD-10-CM | POA: Diagnosis not present

## 2013-06-11 DIAGNOSIS — K3189 Other diseases of stomach and duodenum: Secondary | ICD-10-CM | POA: Diagnosis not present

## 2013-06-11 DIAGNOSIS — IMO0002 Reserved for concepts with insufficient information to code with codable children: Secondary | ICD-10-CM | POA: Diagnosis not present

## 2013-06-11 DIAGNOSIS — E119 Type 2 diabetes mellitus without complications: Secondary | ICD-10-CM | POA: Diagnosis not present

## 2013-06-11 DIAGNOSIS — E1159 Type 2 diabetes mellitus with other circulatory complications: Secondary | ICD-10-CM | POA: Diagnosis not present

## 2013-06-11 DIAGNOSIS — I10 Essential (primary) hypertension: Secondary | ICD-10-CM | POA: Diagnosis not present

## 2013-07-02 DIAGNOSIS — G4700 Insomnia, unspecified: Secondary | ICD-10-CM | POA: Diagnosis not present

## 2013-07-02 DIAGNOSIS — I10 Essential (primary) hypertension: Secondary | ICD-10-CM | POA: Diagnosis not present

## 2013-07-02 DIAGNOSIS — I119 Hypertensive heart disease without heart failure: Secondary | ICD-10-CM | POA: Diagnosis not present

## 2013-07-02 DIAGNOSIS — E119 Type 2 diabetes mellitus without complications: Secondary | ICD-10-CM | POA: Diagnosis not present

## 2013-07-02 DIAGNOSIS — R1013 Epigastric pain: Secondary | ICD-10-CM | POA: Diagnosis not present

## 2013-07-02 DIAGNOSIS — M1710 Unilateral primary osteoarthritis, unspecified knee: Secondary | ICD-10-CM | POA: Diagnosis not present

## 2013-07-02 DIAGNOSIS — K3189 Other diseases of stomach and duodenum: Secondary | ICD-10-CM | POA: Diagnosis not present

## 2013-07-02 DIAGNOSIS — M545 Low back pain, unspecified: Secondary | ICD-10-CM | POA: Diagnosis not present

## 2013-07-02 DIAGNOSIS — IMO0002 Reserved for concepts with insufficient information to code with codable children: Secondary | ICD-10-CM | POA: Diagnosis not present

## 2013-07-29 DIAGNOSIS — H47239 Glaucomatous optic atrophy, unspecified eye: Secondary | ICD-10-CM | POA: Diagnosis not present

## 2013-07-30 DIAGNOSIS — M1710 Unilateral primary osteoarthritis, unspecified knee: Secondary | ICD-10-CM | POA: Diagnosis not present

## 2013-07-30 DIAGNOSIS — I10 Essential (primary) hypertension: Secondary | ICD-10-CM | POA: Diagnosis not present

## 2013-07-30 DIAGNOSIS — E559 Vitamin D deficiency, unspecified: Secondary | ICD-10-CM | POA: Diagnosis not present

## 2013-07-30 DIAGNOSIS — K3189 Other diseases of stomach and duodenum: Secondary | ICD-10-CM | POA: Diagnosis not present

## 2013-07-30 DIAGNOSIS — I119 Hypertensive heart disease without heart failure: Secondary | ICD-10-CM | POA: Diagnosis not present

## 2013-07-30 DIAGNOSIS — R1013 Epigastric pain: Secondary | ICD-10-CM | POA: Diagnosis not present

## 2013-07-30 DIAGNOSIS — IMO0002 Reserved for concepts with insufficient information to code with codable children: Secondary | ICD-10-CM | POA: Diagnosis not present

## 2013-07-30 DIAGNOSIS — G4700 Insomnia, unspecified: Secondary | ICD-10-CM | POA: Diagnosis not present

## 2013-07-30 DIAGNOSIS — E119 Type 2 diabetes mellitus without complications: Secondary | ICD-10-CM | POA: Diagnosis not present

## 2013-08-14 DIAGNOSIS — M76899 Other specified enthesopathies of unspecified lower limb, excluding foot: Secondary | ICD-10-CM | POA: Diagnosis not present

## 2013-08-14 DIAGNOSIS — G4700 Insomnia, unspecified: Secondary | ICD-10-CM | POA: Diagnosis not present

## 2013-08-14 DIAGNOSIS — M1710 Unilateral primary osteoarthritis, unspecified knee: Secondary | ICD-10-CM | POA: Diagnosis not present

## 2013-08-14 DIAGNOSIS — M25569 Pain in unspecified knee: Secondary | ICD-10-CM | POA: Diagnosis not present

## 2013-08-14 DIAGNOSIS — I10 Essential (primary) hypertension: Secondary | ICD-10-CM | POA: Diagnosis not present

## 2013-08-14 DIAGNOSIS — IMO0002 Reserved for concepts with insufficient information to code with codable children: Secondary | ICD-10-CM | POA: Diagnosis not present

## 2013-08-14 DIAGNOSIS — E119 Type 2 diabetes mellitus without complications: Secondary | ICD-10-CM | POA: Diagnosis not present

## 2013-08-14 DIAGNOSIS — I119 Hypertensive heart disease without heart failure: Secondary | ICD-10-CM | POA: Diagnosis not present

## 2013-08-28 DIAGNOSIS — H4011X0 Primary open-angle glaucoma, stage unspecified: Secondary | ICD-10-CM | POA: Diagnosis not present

## 2013-09-04 DIAGNOSIS — E119 Type 2 diabetes mellitus without complications: Secondary | ICD-10-CM | POA: Diagnosis not present

## 2013-09-04 DIAGNOSIS — E559 Vitamin D deficiency, unspecified: Secondary | ICD-10-CM | POA: Diagnosis not present

## 2013-09-04 DIAGNOSIS — K3189 Other diseases of stomach and duodenum: Secondary | ICD-10-CM | POA: Diagnosis not present

## 2013-09-04 DIAGNOSIS — I119 Hypertensive heart disease without heart failure: Secondary | ICD-10-CM | POA: Diagnosis not present

## 2013-09-04 DIAGNOSIS — I10 Essential (primary) hypertension: Secondary | ICD-10-CM | POA: Diagnosis not present

## 2013-09-04 DIAGNOSIS — G4700 Insomnia, unspecified: Secondary | ICD-10-CM | POA: Diagnosis not present

## 2013-09-04 DIAGNOSIS — IMO0002 Reserved for concepts with insufficient information to code with codable children: Secondary | ICD-10-CM | POA: Diagnosis not present

## 2013-09-04 DIAGNOSIS — M1710 Unilateral primary osteoarthritis, unspecified knee: Secondary | ICD-10-CM | POA: Diagnosis not present

## 2013-10-18 DIAGNOSIS — C61 Malignant neoplasm of prostate: Secondary | ICD-10-CM | POA: Diagnosis not present

## 2013-10-21 DIAGNOSIS — E559 Vitamin D deficiency, unspecified: Secondary | ICD-10-CM | POA: Diagnosis not present

## 2013-10-21 DIAGNOSIS — Z5181 Encounter for therapeutic drug level monitoring: Secondary | ICD-10-CM | POA: Diagnosis not present

## 2013-10-21 DIAGNOSIS — M1710 Unilateral primary osteoarthritis, unspecified knee: Secondary | ICD-10-CM | POA: Diagnosis not present

## 2013-10-21 DIAGNOSIS — IMO0002 Reserved for concepts with insufficient information to code with codable children: Secondary | ICD-10-CM | POA: Diagnosis not present

## 2013-10-21 DIAGNOSIS — G4700 Insomnia, unspecified: Secondary | ICD-10-CM | POA: Diagnosis not present

## 2013-10-21 DIAGNOSIS — K3189 Other diseases of stomach and duodenum: Secondary | ICD-10-CM | POA: Diagnosis not present

## 2013-10-21 DIAGNOSIS — E119 Type 2 diabetes mellitus without complications: Secondary | ICD-10-CM | POA: Diagnosis not present

## 2013-10-21 DIAGNOSIS — Z125 Encounter for screening for malignant neoplasm of prostate: Secondary | ICD-10-CM | POA: Diagnosis not present

## 2013-10-21 DIAGNOSIS — I10 Essential (primary) hypertension: Secondary | ICD-10-CM | POA: Diagnosis not present

## 2013-10-21 DIAGNOSIS — I119 Hypertensive heart disease without heart failure: Secondary | ICD-10-CM | POA: Diagnosis not present

## 2013-10-21 DIAGNOSIS — H538 Other visual disturbances: Secondary | ICD-10-CM | POA: Diagnosis not present

## 2013-10-21 DIAGNOSIS — Z79899 Other long term (current) drug therapy: Secondary | ICD-10-CM | POA: Diagnosis not present

## 2013-10-30 DIAGNOSIS — IMO0002 Reserved for concepts with insufficient information to code with codable children: Secondary | ICD-10-CM | POA: Diagnosis not present

## 2013-10-30 DIAGNOSIS — M1710 Unilateral primary osteoarthritis, unspecified knee: Secondary | ICD-10-CM | POA: Diagnosis not present

## 2013-11-06 DIAGNOSIS — C61 Malignant neoplasm of prostate: Secondary | ICD-10-CM | POA: Diagnosis not present

## 2013-11-06 DIAGNOSIS — N401 Enlarged prostate with lower urinary tract symptoms: Secondary | ICD-10-CM | POA: Diagnosis not present

## 2013-11-06 DIAGNOSIS — N139 Obstructive and reflux uropathy, unspecified: Secondary | ICD-10-CM | POA: Diagnosis not present

## 2013-12-19 ENCOUNTER — Encounter: Payer: Self-pay | Admitting: Gastroenterology

## 2013-12-23 DIAGNOSIS — G4700 Insomnia, unspecified: Secondary | ICD-10-CM | POA: Diagnosis not present

## 2013-12-23 DIAGNOSIS — E559 Vitamin D deficiency, unspecified: Secondary | ICD-10-CM | POA: Diagnosis not present

## 2013-12-23 DIAGNOSIS — K3189 Other diseases of stomach and duodenum: Secondary | ICD-10-CM | POA: Diagnosis not present

## 2013-12-23 DIAGNOSIS — M1710 Unilateral primary osteoarthritis, unspecified knee: Secondary | ICD-10-CM | POA: Diagnosis not present

## 2013-12-23 DIAGNOSIS — I119 Hypertensive heart disease without heart failure: Secondary | ICD-10-CM | POA: Diagnosis not present

## 2013-12-23 DIAGNOSIS — I10 Essential (primary) hypertension: Secondary | ICD-10-CM | POA: Diagnosis not present

## 2013-12-23 DIAGNOSIS — R1013 Epigastric pain: Secondary | ICD-10-CM | POA: Diagnosis not present

## 2013-12-23 DIAGNOSIS — E119 Type 2 diabetes mellitus without complications: Secondary | ICD-10-CM | POA: Diagnosis not present

## 2013-12-23 DIAGNOSIS — IMO0002 Reserved for concepts with insufficient information to code with codable children: Secondary | ICD-10-CM | POA: Diagnosis not present

## 2013-12-31 DIAGNOSIS — M1710 Unilateral primary osteoarthritis, unspecified knee: Secondary | ICD-10-CM | POA: Diagnosis not present

## 2013-12-31 DIAGNOSIS — I119 Hypertensive heart disease without heart failure: Secondary | ICD-10-CM | POA: Diagnosis not present

## 2013-12-31 DIAGNOSIS — Z01810 Encounter for preprocedural cardiovascular examination: Secondary | ICD-10-CM | POA: Diagnosis not present

## 2013-12-31 DIAGNOSIS — E785 Hyperlipidemia, unspecified: Secondary | ICD-10-CM | POA: Diagnosis not present

## 2014-01-07 ENCOUNTER — Other Ambulatory Visit: Payer: Self-pay | Admitting: Orthopedic Surgery

## 2014-01-15 DIAGNOSIS — Z23 Encounter for immunization: Secondary | ICD-10-CM | POA: Diagnosis not present

## 2014-01-17 ENCOUNTER — Encounter (HOSPITAL_COMMUNITY): Payer: Self-pay | Admitting: Pharmacy Technician

## 2014-01-23 ENCOUNTER — Encounter (HOSPITAL_COMMUNITY): Payer: Self-pay

## 2014-01-23 ENCOUNTER — Encounter (HOSPITAL_COMMUNITY)
Admission: RE | Admit: 2014-01-23 | Discharge: 2014-01-23 | Disposition: A | Discharge status: Home / Self Care | Payer: Medicare Other | Source: Ambulatory Visit | Attending: Orthopedic Surgery | Admitting: Orthopedic Surgery

## 2014-01-23 DIAGNOSIS — I10 Essential (primary) hypertension: Secondary | ICD-10-CM | POA: Insufficient documentation

## 2014-01-23 DIAGNOSIS — I1310 Hypertensive heart and chronic kidney disease without heart failure, with stage 1 through stage 4 chronic kidney disease, or unspecified chronic kidney disease: Secondary | ICD-10-CM | POA: Diagnosis not present

## 2014-01-23 DIAGNOSIS — E119 Type 2 diabetes mellitus without complications: Secondary | ICD-10-CM | POA: Diagnosis not present

## 2014-01-23 DIAGNOSIS — E780 Pure hypercholesterolemia: Secondary | ICD-10-CM | POA: Diagnosis not present

## 2014-01-23 DIAGNOSIS — E785 Hyperlipidemia, unspecified: Secondary | ICD-10-CM | POA: Insufficient documentation

## 2014-01-23 DIAGNOSIS — Z87891 Personal history of nicotine dependence: Secondary | ICD-10-CM | POA: Diagnosis not present

## 2014-01-23 DIAGNOSIS — Z01818 Encounter for other preprocedural examination: Secondary | ICD-10-CM | POA: Diagnosis not present

## 2014-01-23 DIAGNOSIS — K219 Gastro-esophageal reflux disease without esophagitis: Secondary | ICD-10-CM | POA: Diagnosis not present

## 2014-01-23 DIAGNOSIS — M179 Osteoarthritis of knee, unspecified: Secondary | ICD-10-CM | POA: Insufficient documentation

## 2014-01-23 HISTORY — DX: Unspecified glaucoma: H40.9

## 2014-01-23 HISTORY — DX: Personal history of other infectious and parasitic diseases: Z86.19

## 2014-01-23 HISTORY — DX: Gastro-esophageal reflux disease without esophagitis: K21.9

## 2014-01-23 LAB — CBC WITH DIFFERENTIAL/PLATELET
BASOS PCT: 1 % (ref 0–1)
Basophils Absolute: 0 10*3/uL (ref 0.0–0.1)
Eosinophils Absolute: 0.2 10*3/uL (ref 0.0–0.7)
Eosinophils Relative: 4 % (ref 0–5)
HCT: 38 % — ABNORMAL LOW (ref 39.0–52.0)
HEMOGLOBIN: 12.4 g/dL — AB (ref 13.0–17.0)
LYMPHS ABS: 1.5 10*3/uL (ref 0.7–4.0)
Lymphocytes Relative: 38 % (ref 12–46)
MCH: 31.6 pg (ref 26.0–34.0)
MCHC: 32.6 g/dL (ref 30.0–36.0)
MCV: 96.7 fL (ref 78.0–100.0)
MONOS PCT: 9 % (ref 3–12)
Monocytes Absolute: 0.3 10*3/uL (ref 0.1–1.0)
NEUTROS ABS: 1.9 10*3/uL (ref 1.7–7.7)
NEUTROS PCT: 48 % (ref 43–77)
PLATELETS: 257 10*3/uL (ref 150–400)
RBC: 3.93 MIL/uL — AB (ref 4.22–5.81)
RDW: 13.6 % (ref 11.5–15.5)
WBC: 3.8 10*3/uL — ABNORMAL LOW (ref 4.0–10.5)

## 2014-01-23 LAB — SURGICAL PCR SCREEN
MRSA, PCR: NEGATIVE
STAPHYLOCOCCUS AUREUS: NEGATIVE

## 2014-01-23 LAB — TYPE AND SCREEN
ABO/RH(D): A POS
Antibody Screen: NEGATIVE

## 2014-01-23 LAB — APTT: APTT: 32 s (ref 24–37)

## 2014-01-23 LAB — BASIC METABOLIC PANEL
ANION GAP: 11 (ref 5–15)
BUN: 13 mg/dL (ref 6–23)
CHLORIDE: 101 meq/L (ref 96–112)
CO2: 28 mEq/L (ref 19–32)
Calcium: 9.5 mg/dL (ref 8.4–10.5)
Creatinine, Ser: 1 mg/dL (ref 0.50–1.35)
GFR, EST AFRICAN AMERICAN: 82 mL/min — AB (ref >90)
GFR, EST NON AFRICAN AMERICAN: 70 mL/min — AB (ref >90)
Glucose, Bld: 133 mg/dL — ABNORMAL HIGH (ref 70–99)
POTASSIUM: 4.5 meq/L (ref 3.7–5.3)
Sodium: 140 mEq/L (ref 137–147)

## 2014-01-23 LAB — ABO/RH: ABO/RH(D): A POS

## 2014-01-23 LAB — PROTIME-INR
INR: 1.04 (ref 0.00–1.49)
PROTHROMBIN TIME: 13.7 s (ref 11.6–15.2)

## 2014-01-23 NOTE — Progress Notes (Signed)
Primary - dr. Sheppard Evens bonsu Cardiologist - dr. Candee Furbish, stress cardiac clearance - dr. Wynonia Lawman - will request Chest xray at dr. Sheppard Evens bonsu office - will request

## 2014-01-23 NOTE — Pre-Procedure Instructions (Signed)
[name] [name]  01/23/2014   Your procedure is scheduled on:  Friday, October 23rd  Call to [facility] [phone] at 0800 AM to find out your arrival time.  Call this number if you have problems the morning of surgery: [phone]   Remember:   Do not eat food or drink liquids after midnight.   Take these medicines the morning of surgery with A SIP OF WATER: neurontin, prilosec, proscar, hydrocodone if needed  Stop taking aspirin, OTC vitamins/herbal medications, NSAIDS (ibuprofen, advil, motrin) 7 days prior to surgery.   Do not wear jewelry, make-up or nail polish.  Do not wear lotions, powders, or perfumes. You may wear deodorant.  Do not shave 48 hours prior to surgery. Men may shave face and neck.  Do not bring valuables to the hospital.  [facility] is not responsible for any belongings or valuables.               Contacts, dentures or bridgework may not be worn into surgery.  Leave suitcase in the car. After surgery it may be brought to your room.  For patients admitted to the hospital, discharge time is determined by your treatment team.        Please read over the following fact sheets that you were given: Pain Booklet, Coughing and Deep Breathing, Blood Transfusion Information, MRSA Information and Surgical Site Infection Prevention [place] - Preparing for Surgery  Before surgery, you can play an important role.  Because skin is not sterile, your skin needs to be as free of germs as possible.  You can reduce the number of germs on you skin by washing with CHG (chlorahexidine gluconate) soap before surgery.  CHG is an antiseptic cleaner which kills germs and bonds with the skin to continue killing germs even after washing.  Please DO NOT use if you have an allergy to CHG or antibacterial soaps.  If your skin becomes reddened/irritated stop using the CHG and inform your nurse when you arrive at Short Stay.  Do not shave (including legs and underarms)  for at least 48 hours prior to the first CHG shower.  You may shave your face.  Please follow these instructions carefully:   1.  Shower with CHG Soap the night before surgery and the morning of Surgery.  2.  If you choose to wash your hair, wash your hair first as usual with your normal shampoo.  3.  After you shampoo, rinse your hair and body thoroughly to remove the shampoo.  4.  Use CHG as you would any other liquid soap.  You can apply CHG directly to the skin and wash gently with scrungie or a clean washcloth.  5.  Apply the CHG Soap to your body ONLY FROM THE NECK DOWN.  Do not use on open wounds or open sores.  Avoid contact with your eyes, ears, mouth and genitals (private parts).  Wash genitals (private parts) with your normal soap.  6.  Wash thoroughly, paying special attention to the area where your surgery will be performed.  7.  Thoroughly rinse your body with warm water from the neck down.  8.  DO NOT shower/wash with your normal soap after using and rinsing off the CHG Soap.  9.  Pat yourself dry with a clean towel.            10.  Wear clean pajamas.            11.  Place clean sheets  on your bed the night of your first shower and do not sleep with pets.  Day of Surgery  Do not apply any lotions/deoderants the morning of surgery.  Please wear clean clothes to the hospital/surgery center.

## 2014-01-24 DIAGNOSIS — H04123 Dry eye syndrome of bilateral lacrimal glands: Secondary | ICD-10-CM | POA: Diagnosis not present

## 2014-01-24 DIAGNOSIS — H47233 Glaucomatous optic atrophy, bilateral: Secondary | ICD-10-CM | POA: Diagnosis not present

## 2014-01-24 LAB — URINE CULTURE: Colony Count: 25000

## 2014-01-27 NOTE — Progress Notes (Addendum)
Anesthesia Chart Review:  Patient is a 79 year old male scheduled for right TKA on 01/31/14 by Dr. Mayer Camel.  Case is posted for [facility].  History includes former smoker, HTN, HLD, DM2, GERD, glaucoma, diverticulosis, shingles, osteoarthritis.  PCP is Dr. Vista Lawman.  He told patient he was seen by a cardiologist for clearance.  He told his PAT nurse he thought it was Dr. Wynonia Lawman; however, when Dr. Thurman Coyer office was contacted they stated that patient had never been seen there.  I contacted Dr. Damita Dunnings office Lattie Haw) who reported that it was actually Dr. Woody Seller who cleared patient.  I've asked her to fax cardiology clearance records to PAT.   Preoperative labs noted.    CXR is still pending from his PCP.  EKG and cardiology records are also pending.  Chart left for nurse follow-up.  [facility] Short Stay Center/Anesthesiology Phone [phone] 01/27/2014 1:30 PM  Addendum: Cardiology records received.  Patient was cleared by Dr. Vear Clock with low risk for perioperative CV complication with permission to hold ASA 7 days prior to surgery.  EKG on 12/31/13 showed: NSR, occasional PAC.  Exercise stress test on 08/22/12: Negative for ischemia. Hypertensive.  Continue primary prevention. Normal exercise capacity.  Echo 06/22/12: Mild concentric LVH with normal LV internal dimension wall motion. LVEF is 52% by M-mode measurement, but is at least 55% by 2-D imaging. Normal RV size and contractility. Evidence of grade 1 left ventricular diastolic dysfunction. Trace mitral and aortic regurgitation. Mild tricuspid regurgitation. Estimated RVSP 26 mmHg.  CXR report still pending. Chart left for nurse follow-up.  Based on available records, plan to proceed if no acute changes.  Myra Gianotti, PA-C [facility] Short Stay Center/Anesthesiology Phone [phone] 01/28/2014 9:30 AM

## 2014-01-30 MED ORDER — CEFAZOLIN SODIUM-DEXTROSE 2-3 GM-% IV SOLR
2.0000 g | INTRAVENOUS | Status: AC
  Administered 2014-01-31: 2 g via INTRAVENOUS
  Filled 2014-01-30: qty 50

## 2014-01-30 NOTE — H&P (Signed)
TOTAL KNEE ADMISSION H&P  Patient is being admitted for right total knee arthroplasty.  Subjective:  Chief Complaint:right knee pain.  HPI: [name] [name], 79 y.o. male, has a history of pain and functional disability in the right knee due to arthritis and has failed non-surgical conservative treatments for greater than 12 weeks to includeNSAID's and/or analgesics, corticosteriod injections, flexibility and strengthening excercises and activity modification.  Onset of symptoms was gradual, starting several years ago with gradually worsening course since that time. The patient noted no past surgery on the right knee(s).  Patient currently rates pain in the right knee(s) at 10 out of 10 with activity. Patient has worsening of pain with activity and weight bearing, pain that interferes with activities of daily living, pain with passive range of motion, crepitus and decreased range of motion.  Patient has evidence of subchondral sclerosis, periarticular osteophytes and joint space narrowing by imaging studies.  There is no active infection.  Patient Active Problem List   Diagnosis Date Noted  . Hematuria 12/13/2011  . PHN (postherpetic neuralgia) 12/13/2011  . Diverticulitis 11/30/2011  . Low back pain 08/16/2011  . DIAB W/RENAL MANIFESTS TYPE II/UNS TYPE UNCNTRL 01/17/2008  . Pure hypercholesterolemia 01/17/2008  . HTN HEART/CKD NOS W/O HF W/CKD I-IV/UNSPC 01/17/2008  . Osteoarth NOS-Unspec 01/17/2008  . ELEVATED PROSTATE SPECIFIC ANTIGEN 01/17/2008   Past Medical History  Diagnosis Date  . Hyperlipidemia     5 yrs  . Osteoarthritis   . Diverticulosis 2009  . Hypertension   . Diabetes mellitus 1955    type 2 - fasting 100-120  . GERD (gastroesophageal reflux disease)   . History of shingles   . Glaucoma     bilateral    Past Surgical History  Procedure Laterality Date  . Colonoscopy      No prescriptions prior to admission   No Known Allergies  History  Substance Use Topics  .  Smoking status: Former Smoker    Types: Cigars    Quit date: 04/12/1995  . Smokeless tobacco: Never Used  . Alcohol Use: No    Family History  Problem Relation Age of Onset  . Heart disease Brother     questionable  . Arthritis Other   . Diabetes Other   . Cancer Other     prostate     Review of Systems  Constitutional: Negative.   HENT: Negative.   Eyes: Positive for blurred vision.  Respiratory: Negative.   Cardiovascular: Negative.   Gastrointestinal: Positive for nausea.  Genitourinary: Positive for frequency.  Musculoskeletal: Positive for joint pain.  Skin: Negative.   Neurological: Positive for dizziness.  Endo/Heme/Allergies: Bruises/bleeds easily.  Psychiatric/Behavioral: The patient is nervous/anxious.     Objective:  Physical Exam  Constitutional: He is oriented to person, place, and time. He appears well-developed and well-nourished.  HENT:  Head: Normocephalic and atraumatic.  Eyes: Pupils are equal, round, and reactive to light.  Neck: Normal range of motion. Neck supple.  Cardiovascular: Intact distal pulses.   Respiratory: Effort normal.  Musculoskeletal:  Both knees have varus deformities and lack 10 of full extension on the right, 5 on the left.  Quite tender along the medial joint line of the right knee.  One plus effusion flexion is to 110 on the right 120 on the left collateral ligaments are stable and Lachman's test is negative.  Neurological: He is alert and oriented to person, place, and time.  Skin: Skin is warm and dry.  Psychiatric: He has a normal mood  and affect. His behavior is normal. Judgment and thought content normal.    Vital signs in last 24 hours:    Labs:   Estimated body mass index is 24.13 kg/(m^2) as calculated from the following:   Height as of 02/18/10: 5\' 5"  (1.651 m).   Weight as of 02/01/12: 65.772 kg (145 lb).   Imaging Review Plain radiographs demonstrate AP, Rosenberg, lateral and sunrise x-rays show  end-stage bone-on-bone arthritis with early erosion of the tibial plateau medially on the right large peripheral osteophytes and subluxation of the right tibia beneath the femur of about 4-5 mm.  Assessment/Plan:  End stage arthritis, right knee   The patient history, physical examination, clinical judgment of the provider and imaging studies are consistent with end stage degenerative joint disease of the right knee(s) and total knee arthroplasty is deemed medically necessary. The treatment options including medical management, injection therapy arthroscopy and arthroplasty were discussed at length. The risks and benefits of total knee arthroplasty were presented and reviewed. The risks due to aseptic loosening, infection, stiffness, patella tracking problems, thromboembolic complications and other imponderables were discussed. The patient acknowledged the explanation, agreed to proceed with the plan and consent was signed. Patient is being admitted for inpatient treatment for surgery, pain control, PT, OT, prophylactic antibiotics, VTE prophylaxis, progressive ambulation and ADL's and discharge planning. The patient is planning to be discharged to skilled nursing facility

## 2014-01-30 NOTE — Progress Notes (Signed)
Call to Dr. Emilee Hero- Bonsu, requested last ov note & CXR.  They will fax OV note  (to [phone] do not have any CXR.

## 2014-01-31 ENCOUNTER — Encounter (HOSPITAL_COMMUNITY): Payer: Medicare Other | Admitting: Vascular Surgery

## 2014-01-31 ENCOUNTER — Inpatient Hospital Stay (HOSPITAL_COMMUNITY)
Admission: RE | Admit: 2014-01-31 | Discharge: 2014-02-02 | DRG: 470 | Disposition: A | Discharge status: Home Health | Payer: Medicare Other | Source: Ambulatory Visit | Attending: Orthopedic Surgery | Admitting: Orthopedic Surgery

## 2014-01-31 ENCOUNTER — Encounter (HOSPITAL_COMMUNITY): Payer: Self-pay | Admitting: General Practice

## 2014-01-31 ENCOUNTER — Inpatient Hospital Stay (HOSPITAL_COMMUNITY): Payer: Medicare Other

## 2014-01-31 ENCOUNTER — Encounter (HOSPITAL_COMMUNITY)
Admission: RE | Disposition: A | Discharge status: Home Health | Payer: Medicare Other | Source: Ambulatory Visit | Attending: Orthopedic Surgery

## 2014-01-31 ENCOUNTER — Inpatient Hospital Stay (HOSPITAL_COMMUNITY): Payer: Medicare Other | Admitting: Anesthesiology

## 2014-01-31 DIAGNOSIS — Z01812 Encounter for preprocedural laboratory examination: Secondary | ICD-10-CM | POA: Diagnosis not present

## 2014-01-31 DIAGNOSIS — Z01810 Encounter for preprocedural cardiovascular examination: Secondary | ICD-10-CM | POA: Diagnosis not present

## 2014-01-31 DIAGNOSIS — M179 Osteoarthritis of knee, unspecified: Principal | ICD-10-CM | POA: Diagnosis present

## 2014-01-31 DIAGNOSIS — K219 Gastro-esophageal reflux disease without esophagitis: Secondary | ICD-10-CM | POA: Diagnosis present

## 2014-01-31 DIAGNOSIS — Z87891 Personal history of nicotine dependence: Secondary | ICD-10-CM

## 2014-01-31 DIAGNOSIS — I10 Essential (primary) hypertension: Secondary | ICD-10-CM | POA: Diagnosis present

## 2014-01-31 DIAGNOSIS — Z01811 Encounter for preprocedural respiratory examination: Secondary | ICD-10-CM

## 2014-01-31 DIAGNOSIS — Z01818 Encounter for other preprocedural examination: Secondary | ICD-10-CM

## 2014-01-31 DIAGNOSIS — E119 Type 2 diabetes mellitus without complications: Secondary | ICD-10-CM | POA: Diagnosis not present

## 2014-01-31 DIAGNOSIS — E785 Hyperlipidemia, unspecified: Secondary | ICD-10-CM | POA: Diagnosis present

## 2014-01-31 DIAGNOSIS — M1711 Unilateral primary osteoarthritis, right knee: Secondary | ICD-10-CM | POA: Diagnosis not present

## 2014-01-31 DIAGNOSIS — D62 Acute posthemorrhagic anemia: Secondary | ICD-10-CM | POA: Diagnosis not present

## 2014-01-31 DIAGNOSIS — G8918 Other acute postprocedural pain: Secondary | ICD-10-CM | POA: Diagnosis not present

## 2014-01-31 DIAGNOSIS — M1710 Unilateral primary osteoarthritis, unspecified knee: Secondary | ICD-10-CM | POA: Diagnosis present

## 2014-01-31 HISTORY — DX: Malignant neoplasm of prostate: C61

## 2014-01-31 HISTORY — PX: TOTAL KNEE ARTHROPLASTY: SHX125

## 2014-01-31 LAB — GLUCOSE, CAPILLARY
GLUCOSE-CAPILLARY: 144 mg/dL — AB (ref 70–99)
GLUCOSE-CAPILLARY: 225 mg/dL — AB (ref 70–99)
Glucose-Capillary: 119 mg/dL — ABNORMAL HIGH (ref 70–99)
Glucose-Capillary: 120 mg/dL — ABNORMAL HIGH (ref 70–99)
Glucose-Capillary: 126 mg/dL — ABNORMAL HIGH (ref 70–99)
Glucose-Capillary: 131 mg/dL — ABNORMAL HIGH (ref 70–99)

## 2014-01-31 SURGERY — ARTHROPLASTY, KNEE, TOTAL
Anesthesia: General | Site: Knee | Laterality: Right

## 2014-01-31 MED ORDER — ASPIRIN EC 325 MG PO TBEC: 325.0000 mg | DELAYED_RELEASE_TABLET | Freq: Two times a day (BID) | ORAL | Status: DC

## 2014-01-31 MED ORDER — INSULIN ASPART 100 UNIT/ML ~~LOC~~ SOLN
0.0000 [IU] | Freq: Three times a day (TID) | SUBCUTANEOUS | Status: DC
  Administered 2014-01-31: 2 [IU] via SUBCUTANEOUS
  Administered 2014-02-01: 3 [IU] via SUBCUTANEOUS
  Administered 2014-02-01: 8 [IU] via SUBCUTANEOUS
  Administered 2014-02-01 – 2014-02-02 (×3): 3 [IU] via SUBCUTANEOUS

## 2014-01-31 MED ORDER — FINASTERIDE 5 MG PO TABS
5.0000 mg | ORAL_TABLET | Freq: Every day | ORAL | Status: DC
  Administered 2014-01-31 – 2014-02-02 (×3): 5 mg via ORAL
  Filled 2014-01-31 (×3): qty 1

## 2014-01-31 MED ORDER — PNEUMOCOCCAL VAC POLYVALENT 25 MCG/0.5ML IJ INJ: 0.5000 mL | INJECTION | INTRAMUSCULAR | Status: DC

## 2014-01-31 MED ORDER — MENTHOL 3 MG MT LOZG: 1.0000 | LOZENGE | OROMUCOSAL | Status: DC | PRN

## 2014-01-31 MED ORDER — SENNOSIDES-DOCUSATE SODIUM 8.6-50 MG PO TABS: 1.0000 | ORAL_TABLET | Freq: Every evening | ORAL | Status: DC | PRN

## 2014-01-31 MED ORDER — ONDANSETRON HCL 4 MG/2ML IJ SOLN: 4.0000 mg | Freq: Four times a day (QID) | INTRAMUSCULAR | Status: DC | PRN

## 2014-01-31 MED ORDER — METFORMIN HCL 500 MG PO TABS
500.0000 mg | ORAL_TABLET | Freq: Two times a day (BID) | ORAL | Status: DC
  Administered 2014-01-31 – 2014-02-02 (×4): 500 mg via ORAL
  Filled 2014-01-31 (×6): qty 1

## 2014-01-31 MED ORDER — FLEET ENEMA 7-19 GM/118ML RE ENEM: 1.0000 | ENEMA | Freq: Once | RECTAL | Status: AC | PRN

## 2014-01-31 MED ORDER — METOCLOPRAMIDE HCL 5 MG/ML IJ SOLN: 5.0000 mg | Freq: Three times a day (TID) | INTRAMUSCULAR | Status: DC | PRN

## 2014-01-31 MED ORDER — SIMVASTATIN 40 MG PO TABS
40.0000 mg | ORAL_TABLET | Freq: Every day | ORAL | Status: DC
  Administered 2014-02-01 – 2014-02-02 (×2): 40 mg via ORAL
  Filled 2014-01-31 (×2): qty 1

## 2014-01-31 MED ORDER — HYDROMORPHONE HCL 1 MG/ML IJ SOLN
0.2500 mg | INTRAMUSCULAR | Status: DC | PRN
  Administered 2014-01-31 (×4): 0.5 mg via INTRAVENOUS

## 2014-01-31 MED ORDER — ONDANSETRON HCL 4 MG/2ML IJ SOLN
INTRAMUSCULAR | Status: AC
  Filled 2014-01-31: qty 2

## 2014-01-31 MED ORDER — KCL IN DEXTROSE-NACL 20-5-0.2 MEQ/L-%-% IV SOLN: INTRAVENOUS | Status: DC

## 2014-01-31 MED ORDER — KCL IN DEXTROSE-NACL 20-5-0.45 MEQ/L-%-% IV SOLN
INTRAVENOUS | Status: DC
  Administered 2014-01-31 – 2014-02-01 (×2): via INTRAVENOUS
  Filled 2014-01-31 (×7): qty 1000

## 2014-01-31 MED ORDER — BUPIVACAINE LIPOSOME 1.3 % IJ SUSP
INTRAMUSCULAR | Status: DC | PRN
  Administered 2014-01-31: 20 mL

## 2014-01-31 MED ORDER — FENTANYL CITRATE 0.05 MG/ML IJ SOLN
INTRAMUSCULAR | Status: DC | PRN
  Administered 2014-01-31: 25 ug via INTRAVENOUS
  Administered 2014-01-31: 100 ug via INTRAVENOUS

## 2014-01-31 MED ORDER — MIDAZOLAM HCL 2 MG/2ML IJ SOLN
INTRAMUSCULAR | Status: AC
  Filled 2014-01-31: qty 2

## 2014-01-31 MED ORDER — FENTANYL CITRATE 0.05 MG/ML IJ SOLN
50.0000 ug | INTRAMUSCULAR | Status: DC | PRN
  Administered 2014-01-31: 50 ug via INTRAVENOUS

## 2014-01-31 MED ORDER — ARTIFICIAL TEARS OP OINT
TOPICAL_OINTMENT | OPHTHALMIC | Status: AC
  Filled 2014-01-31: qty 3.5

## 2014-01-31 MED ORDER — METOCLOPRAMIDE HCL 10 MG PO TABS: 5.0000 mg | ORAL_TABLET | Freq: Three times a day (TID) | ORAL | Status: DC | PRN

## 2014-01-31 MED ORDER — FUROSEMIDE 20 MG PO TABS
20.0000 mg | ORAL_TABLET | ORAL | Status: DC
  Administered 2014-01-31 – 2014-02-02 (×2): 20 mg via ORAL
  Filled 2014-01-31 (×2): qty 1

## 2014-01-31 MED ORDER — GLYCOPYRROLATE 0.2 MG/ML IJ SOLN
INTRAMUSCULAR | Status: AC
  Filled 2014-01-31: qty 1

## 2014-01-31 MED ORDER — METHOCARBAMOL 1000 MG/10ML IJ SOLN
500.0000 mg | Freq: Four times a day (QID) | INTRAVENOUS | Status: DC | PRN
  Filled 2014-01-31: qty 5

## 2014-01-31 MED ORDER — SODIUM CHLORIDE 0.9 % IR SOLN
Status: DC | PRN
  Administered 2014-01-31: 1000 mL

## 2014-01-31 MED ORDER — CEFUROXIME SODIUM 1.5 G IJ SOLR
INTRAMUSCULAR | Status: AC
  Filled 2014-01-31: qty 1.5

## 2014-01-31 MED ORDER — GABAPENTIN 600 MG PO TABS
600.0000 mg | ORAL_TABLET | Freq: Two times a day (BID) | ORAL | Status: DC
  Administered 2014-01-31 – 2014-02-02 (×4): 600 mg via ORAL
  Filled 2014-01-31 (×5): qty 1

## 2014-01-31 MED ORDER — ROCURONIUM BROMIDE 50 MG/5ML IV SOLN
INTRAVENOUS | Status: AC
  Filled 2014-01-31: qty 1

## 2014-01-31 MED ORDER — ASPIRIN EC 325 MG PO TBEC
325.0000 mg | DELAYED_RELEASE_TABLET | Freq: Every day | ORAL | Status: DC
  Administered 2014-02-01 – 2014-02-02 (×2): 325 mg via ORAL
  Filled 2014-01-31 (×3): qty 1

## 2014-01-31 MED ORDER — PROPOFOL 10 MG/ML IV BOLUS
INTRAVENOUS | Status: AC
  Filled 2014-01-31: qty 20

## 2014-01-31 MED ORDER — LIDOCAINE HCL (CARDIAC) 20 MG/ML IV SOLN
INTRAVENOUS | Status: DC | PRN
  Administered 2014-01-31: 100 mg via INTRAVENOUS

## 2014-01-31 MED ORDER — MIDAZOLAM HCL 2 MG/2ML IJ SOLN
1.0000 mg | INTRAMUSCULAR | Status: DC | PRN
  Administered 2014-01-31: 2 mg via INTRAVENOUS

## 2014-01-31 MED ORDER — LIDOCAINE HCL (CARDIAC) 20 MG/ML IV SOLN
INTRAVENOUS | Status: AC
  Filled 2014-01-31: qty 5

## 2014-01-31 MED ORDER — LACTATED RINGERS IV SOLN
INTRAVENOUS | Status: DC
  Administered 2014-01-31 (×2): via INTRAVENOUS

## 2014-01-31 MED ORDER — ALUM & MAG HYDROXIDE-SIMETH 200-200-20 MG/5ML PO SUSP: 30.0000 mL | ORAL | Status: DC | PRN

## 2014-01-31 MED ORDER — SODIUM CHLORIDE 0.9 % IR SOLN
Status: DC | PRN
  Administered 2014-01-31: 3000 mL

## 2014-01-31 MED ORDER — BISACODYL 5 MG PO TBEC: 5.0000 mg | DELAYED_RELEASE_TABLET | Freq: Every day | ORAL | Status: DC | PRN

## 2014-01-31 MED ORDER — ACETAMINOPHEN 650 MG RE SUPP: 650.0000 mg | Freq: Four times a day (QID) | RECTAL | Status: DC | PRN

## 2014-01-31 MED ORDER — FENTANYL CITRATE 0.05 MG/ML IJ SOLN
INTRAMUSCULAR | Status: AC
  Filled 2014-01-31: qty 5

## 2014-01-31 MED ORDER — FENTANYL CITRATE 0.05 MG/ML IJ SOLN
INTRAMUSCULAR | Status: AC
  Filled 2014-01-31: qty 2

## 2014-01-31 MED ORDER — PANTOPRAZOLE SODIUM 40 MG PO TBEC
40.0000 mg | DELAYED_RELEASE_TABLET | Freq: Every day | ORAL | Status: DC
  Administered 2014-01-31 – 2014-02-02 (×3): 40 mg via ORAL
  Filled 2014-01-31 (×3): qty 1

## 2014-01-31 MED ORDER — HYDROMORPHONE HCL 1 MG/ML IJ SOLN
INTRAMUSCULAR | Status: AC
  Filled 2014-01-31: qty 1

## 2014-01-31 MED ORDER — EPHEDRINE SULFATE 50 MG/ML IJ SOLN
INTRAMUSCULAR | Status: AC
  Filled 2014-01-31: qty 1

## 2014-01-31 MED ORDER — GLYCOPYRROLATE 0.2 MG/ML IJ SOLN
INTRAMUSCULAR | Status: DC | PRN
  Administered 2014-01-31 (×2): 0.1 mg via INTRAVENOUS

## 2014-01-31 MED ORDER — HYDROCODONE-ACETAMINOPHEN 5-325 MG PO TABS
1.0000 | ORAL_TABLET | Freq: Four times a day (QID) | ORAL | Status: DC | PRN
  Administered 2014-01-31: 1 via ORAL
  Filled 2014-01-31: qty 1

## 2014-01-31 MED ORDER — ACETAMINOPHEN 325 MG PO TABS: 650.0000 mg | ORAL_TABLET | Freq: Four times a day (QID) | ORAL | Status: DC | PRN

## 2014-01-31 MED ORDER — LATANOPROST 0.005 % OP SOLN
1.0000 [drp] | Freq: Every day | OPHTHALMIC | Status: DC
  Administered 2014-01-31 – 2014-02-01 (×2): 1 [drp] via OPHTHALMIC
  Filled 2014-01-31: qty 2.5

## 2014-01-31 MED ORDER — OXYCODONE HCL 5 MG PO TABS: 5.0000 mg | ORAL_TABLET | Freq: Once | ORAL | Status: DC | PRN

## 2014-01-31 MED ORDER — ONDANSETRON HCL 4 MG/2ML IJ SOLN
INTRAMUSCULAR | Status: DC | PRN
  Administered 2014-01-31: 4 mg via INTRAVENOUS

## 2014-01-31 MED ORDER — OXYCODONE HCL 5 MG PO TABS
5.0000 mg | ORAL_TABLET | ORAL | Status: DC | PRN
  Administered 2014-01-31 – 2014-02-02 (×6): 10 mg via ORAL
  Administered 2014-02-02: 5 mg via ORAL
  Administered 2014-02-02: 10 mg via ORAL
  Filled 2014-01-31 (×2): qty 2
  Filled 2014-01-31: qty 1
  Filled 2014-01-31 (×5): qty 2

## 2014-01-31 MED ORDER — SUCCINYLCHOLINE CHLORIDE 20 MG/ML IJ SOLN
INTRAMUSCULAR | Status: AC
  Filled 2014-01-31: qty 1

## 2014-01-31 MED ORDER — DIPHENHYDRAMINE HCL 12.5 MG/5ML PO ELIX: 12.5000 mg | ORAL_SOLUTION | ORAL | Status: DC | PRN

## 2014-01-31 MED ORDER — GLIPIZIDE 5 MG PO TABS
5.0000 mg | ORAL_TABLET | Freq: Two times a day (BID) | ORAL | Status: DC
  Administered 2014-01-31 – 2014-02-02 (×4): 5 mg via ORAL
  Filled 2014-01-31 (×6): qty 1

## 2014-01-31 MED ORDER — ONDANSETRON HCL 4 MG PO TABS: 4.0000 mg | ORAL_TABLET | Freq: Four times a day (QID) | ORAL | Status: DC | PRN

## 2014-01-31 MED ORDER — ATROPINE SULFATE 0.1 MG/ML IJ SOLN
INTRAMUSCULAR | Status: AC
  Filled 2014-01-31: qty 10

## 2014-01-31 MED ORDER — TRANEXAMIC ACID 100 MG/ML IV SOLN
1000.0000 mg | INTRAVENOUS | Status: AC
  Administered 2014-01-31: 1000 mg via INTRAVENOUS
  Filled 2014-01-31: qty 10

## 2014-01-31 MED ORDER — RAMIPRIL 2.5 MG PO CAPS
2.5000 mg | ORAL_CAPSULE | Freq: Every day | ORAL | Status: DC
  Administered 2014-02-01 – 2014-02-02 (×2): 2.5 mg via ORAL
  Filled 2014-01-31 (×2): qty 1

## 2014-01-31 MED ORDER — OXYCODONE-ACETAMINOPHEN 5-325 MG PO TABS: 1.0000 | ORAL_TABLET | ORAL | Status: DC | PRN

## 2014-01-31 MED ORDER — CEFUROXIME SODIUM 1.5 G IJ SOLR
INTRAMUSCULAR | Status: DC | PRN
  Administered 2014-01-31: 1.5 g

## 2014-01-31 MED ORDER — METHOCARBAMOL 500 MG PO TABS: 500.0000 mg | ORAL_TABLET | Freq: Two times a day (BID) | ORAL | Status: DC

## 2014-01-31 MED ORDER — STERILE WATER FOR INJECTION IJ SOLN
INTRAMUSCULAR | Status: AC
  Filled 2014-01-31: qty 10

## 2014-01-31 MED ORDER — HYDROMORPHONE HCL 1 MG/ML IJ SOLN: 0.5000 mg | INTRAMUSCULAR | Status: DC | PRN

## 2014-01-31 MED ORDER — RAMIPRIL 2.5 MG PO TABS: 2.5000 mg | ORAL_TABLET | Freq: Every day | ORAL | Status: DC

## 2014-01-31 MED ORDER — METHOCARBAMOL 500 MG PO TABS
500.0000 mg | ORAL_TABLET | Freq: Four times a day (QID) | ORAL | Status: DC | PRN
  Administered 2014-01-31 – 2014-02-02 (×2): 500 mg via ORAL
  Filled 2014-01-31 (×2): qty 1

## 2014-01-31 MED ORDER — BUPIVACAINE LIPOSOME 1.3 % IJ SUSP
20.0000 mL | Freq: Once | INTRAMUSCULAR | Status: DC
  Filled 2014-01-31: qty 20

## 2014-01-31 MED ORDER — DOCUSATE SODIUM 100 MG PO CAPS
100.0000 mg | ORAL_CAPSULE | Freq: Two times a day (BID) | ORAL | Status: DC
  Administered 2014-01-31 – 2014-02-02 (×4): 100 mg via ORAL
  Filled 2014-01-31 (×5): qty 1

## 2014-01-31 MED ORDER — PHENOL 1.4 % MT LIQD: 1.0000 | OROMUCOSAL | Status: DC | PRN

## 2014-01-31 MED ORDER — PROPOFOL 10 MG/ML IV BOLUS
INTRAVENOUS | Status: DC | PRN
  Administered 2014-01-31: 200 mg via INTRAVENOUS

## 2014-01-31 MED ORDER — TRANEXAMIC ACID 100 MG/ML IV SOLN
1000.0000 mg | INTRAVENOUS | Status: DC | PRN
  Administered 2014-01-31: 1000 mg via INTRAVENOUS

## 2014-01-31 MED ORDER — OXYCODONE HCL 5 MG/5ML PO SOLN: 5.0000 mg | Freq: Once | ORAL | Status: DC | PRN

## 2014-01-31 SURGICAL SUPPLY — 60 items
BANDAGE ELASTIC 6 VELCRO ST LF (GAUZE/BANDAGES/DRESSINGS) ×3 IMPLANT
BANDAGE ESMARK 6X9 LF (GAUZE/BANDAGES/DRESSINGS) ×1 IMPLANT
BLADE SAG 18X100X1.27 (BLADE) ×3 IMPLANT
BLADE SAW SGTL 13X75X1.27 (BLADE) ×3 IMPLANT
BLADE SURG ROTATE 9660 (MISCELLANEOUS) IMPLANT
BNDG ELASTIC 6X10 VLCR STRL LF (GAUZE/BANDAGES/DRESSINGS) ×3 IMPLANT
BNDG ESMARK 6X9 LF (GAUZE/BANDAGES/DRESSINGS) ×3
BOWL SMART MIX CTS (DISPOSABLE) ×3 IMPLANT
CAPT RP KNEE ×3 IMPLANT
CEMENT HV SMART SET (Cement) ×6 IMPLANT
COVER SURGICAL LIGHT HANDLE (MISCELLANEOUS) ×3 IMPLANT
CUFF TOURNIQUET SINGLE 34IN LL (TOURNIQUET CUFF) ×3 IMPLANT
CUFF TOURNIQUET SINGLE 44IN (TOURNIQUET CUFF) IMPLANT
DRAPE EXTREMITY T 121X128X90 (DRAPE) ×3 IMPLANT
DRAPE U-SHAPE 47X51 STRL (DRAPES) ×3 IMPLANT
DURAPREP 26ML APPLICATOR (WOUND CARE) ×6 IMPLANT
ELECT CAUTERY BLADE 6.4 (BLADE) ×3 IMPLANT
ELECT REM PT RETURN 9FT ADLT (ELECTROSURGICAL) ×3
ELECTRODE REM PT RTRN 9FT ADLT (ELECTROSURGICAL) ×1 IMPLANT
EVACUATOR 1/8 PVC DRAIN (DRAIN) ×3 IMPLANT
GAUZE SPONGE 4X4 12PLY STRL (GAUZE/BANDAGES/DRESSINGS) ×6 IMPLANT
GAUZE XEROFORM 1X8 LF (GAUZE/BANDAGES/DRESSINGS) ×3 IMPLANT
GLOVE BIO SURGEON STRL SZ7.5 (GLOVE) ×3 IMPLANT
GLOVE BIO SURGEON STRL SZ8.5 (GLOVE) ×3 IMPLANT
GLOVE BIOGEL PI IND STRL 8 (GLOVE) ×1 IMPLANT
GLOVE BIOGEL PI IND STRL 9 (GLOVE) ×1 IMPLANT
GLOVE BIOGEL PI INDICATOR 8 (GLOVE) ×2
GLOVE BIOGEL PI INDICATOR 9 (GLOVE) ×2
GOWN STRL REUS W/ TWL LRG LVL3 (GOWN DISPOSABLE) ×1 IMPLANT
GOWN STRL REUS W/ TWL XL LVL3 (GOWN DISPOSABLE) ×2 IMPLANT
GOWN STRL REUS W/TWL LRG LVL3 (GOWN DISPOSABLE) ×2
GOWN STRL REUS W/TWL XL LVL3 (GOWN DISPOSABLE) ×4
HANDPIECE INTERPULSE COAX TIP (DISPOSABLE) ×2
HOOD PEEL AWAY FACE SHEILD DIS (HOOD) ×9 IMPLANT
KIT BASIN OR (CUSTOM PROCEDURE TRAY) ×3 IMPLANT
KIT ROOM TURNOVER OR (KITS) ×3 IMPLANT
MANIFOLD NEPTUNE II (INSTRUMENTS) ×3 IMPLANT
NDL SAFETY ECLIPSE 18X1.5 (NEEDLE) IMPLANT
NEEDLE 22X1 1/2 (OR ONLY) (NEEDLE) ×3 IMPLANT
NEEDLE HYPO 18GX1.5 SHARP (NEEDLE)
NEEDLE SPNL 18GX3.5 QUINCKE PK (NEEDLE) IMPLANT
NS IRRIG 1000ML POUR BTL (IV SOLUTION) ×3 IMPLANT
PACK TOTAL JOINT (CUSTOM PROCEDURE TRAY) ×3 IMPLANT
PAD ARMBOARD 7.5X6 YLW CONV (MISCELLANEOUS) ×6 IMPLANT
PADDING CAST COTTON 6X4 STRL (CAST SUPPLIES) ×3 IMPLANT
SET HNDPC FAN SPRY TIP SCT (DISPOSABLE) ×1 IMPLANT
SPONGE GAUZE 4X4 12PLY STER LF (GAUZE/BANDAGES/DRESSINGS) ×3 IMPLANT
STAPLER VISISTAT 35W (STAPLE) ×3 IMPLANT
SUCTION FRAZIER TIP 10 FR DISP (SUCTIONS) ×3 IMPLANT
SUT VIC AB 0 CTX 36 (SUTURE) ×2
SUT VIC AB 0 CTX36XBRD ANTBCTR (SUTURE) ×1 IMPLANT
SUT VIC AB 1 CTX 36 (SUTURE) ×2
SUT VIC AB 1 CTX36XBRD ANBCTR (SUTURE) ×1 IMPLANT
SUT VIC AB 2-0 CT1 27 (SUTURE) ×2
SUT VIC AB 2-0 CT1 TAPERPNT 27 (SUTURE) ×1 IMPLANT
SYR 30ML LL (SYRINGE) ×3 IMPLANT
SYR 50ML LL SCALE MARK (SYRINGE) ×3 IMPLANT
TOWEL OR 17X24 6PK STRL BLUE (TOWEL DISPOSABLE) ×3 IMPLANT
TOWEL OR 17X26 10 PK STRL BLUE (TOWEL DISPOSABLE) ×3 IMPLANT
WATER STERILE IRR 1000ML POUR (IV SOLUTION) ×6 IMPLANT

## 2014-01-31 NOTE — Progress Notes (Signed)
Utilization review completed.  

## 2014-01-31 NOTE — Evaluation (Signed)
Physical Therapy Evaluation Patient Details Name: [name] [name] MRN: [id number] DOB: Sep 02, 1934 Today's Date: 01/31/2014   History of Present Illness  Pt is a 79 y/o male admitted s/p elective R TKA on 01/31/14.   Clinical Impression  This patient presents with acute pain and decreased functional independence following the above mentioned procedure. At the time of PT eval, pt was able to demonstrate transfers and pivotal steps to the recliner with min guard assist. This patient is appropriate for skilled PT interventions to address functional limitations, improve safety and independence with functional mobility, and return to PLOF.     Follow Up Recommendations Home health PT;Supervision for mobility/OOB    Equipment Recommendations  3in1 (PT)    Recommendations for Other Services       Precautions / Restrictions Precautions Precautions: Fall Restrictions Weight Bearing Restrictions: Yes RLE Weight Bearing: Weight bearing as tolerated      Mobility  Bed Mobility Overal bed mobility: Needs Assistance Bed Mobility: Supine to Sit     Supine to sit: Min assist     General bed mobility comments: Assist for initiation of LE movement to EOB, and for trunk elevation to full sitting position.   Transfers Overall transfer level: Needs assistance Equipment used: Rolling walker (2 wheeled) Transfers: Sit to/from [facility] Sit to Stand: Min guard Stand pivot transfers: Min guard       General transfer comment: VC's for hand placement on seated surface for safety. VC's for sequencing and technique with the RW as pt took pivotal steps around to the recliner chair.   Ambulation/Gait                Stairs            Wheelchair Mobility    Modified Rankin (Stroke Patients Only)       Balance Overall balance assessment: Needs assistance Sitting-balance support: Feet supported;No upper extremity supported Sitting balance-Leahy Scale: Good      Standing balance support: Bilateral upper extremity supported;During functional activity Standing balance-Leahy Scale: Poor Standing balance comment: Pt requires UE support to maintain standing balance at this time.                              Pertinent Vitals/Pain Pain Assessment: 0-10 Pain Score: 5  Pain Location: R Knee Pain Intervention(s): Monitored during session;Premedicated before session    [place] expects to be discharged to:: Private residence Living Arrangements: Other relatives Available Help at Discharge: Family;Available 24 hours/day Type of Home: House Home Access: Ramped entrance     Home Layout: One level Home Equipment: Walker - 2 wheels;Cane - single point;Crutches;Shower seat      Prior Function Level of Independence: Independent         Comments: Still driving, independent with grocery shopping and maintaining the house.      Hand Dominance   Dominant Hand: Right    Extremity/Trunk Assessment   Upper Extremity Assessment: Defer to OT evaluation           Lower Extremity Assessment: RLE deficits/detail RLE Deficits / Details: Decreased strength and AROM consistent with TKA    Cervical / Trunk Assessment: Normal  Communication   Communication: No difficulties  Cognition Arousal/Alertness: Awake/alert Behavior During Therapy: WFL for tasks assessed/performed Overall Cognitive Status: Within Functional Limits for tasks assessed  General Comments      Exercises        Assessment/Plan    PT Assessment Patient needs continued PT services  PT Diagnosis Difficulty walking;Acute pain   PT Problem List Decreased strength;Decreased range of motion;Decreased activity tolerance;Decreased balance;Decreased mobility;Decreased knowledge of use of DME;Decreased safety awareness;Decreased knowledge of precautions;Pain  PT Treatment Interventions DME instruction;Gait training;Stair  training;Functional mobility training;Therapeutic activities;Therapeutic exercise;Neuromuscular re-education;Patient/family education   PT Goals (Current goals can be found in the Care Plan section) Acute Rehab PT Goals Patient Stated Goal: To return home with family PT Goal Formulation: With patient/family Time For Goal Achievement: 02/07/14 Potential to Achieve Goals: Good    Frequency 7X/week   Barriers to discharge        Co-evaluation               End of Session Equipment Utilized During Treatment: Gait belt;Oxygen Activity Tolerance: Patient tolerated treatment well Patient left: in chair;with call bell/phone within reach;with family/visitor present Nurse Communication: Mobility status         Time: 7939-0300 PT Time Calculation (min): 27 min   Charges:   PT Evaluation $Initial PT Evaluation Tier I: 1 Procedure PT Treatments $Gait Training: 8-22 mins $Therapeutic Activity: 8-22 mins   PT G Codes:          Rolinda Roan 01/31/2014, 5:00 PM  Rolinda Roan, PT, DPT Acute Rehabilitation Services Pager: [phone]

## 2014-01-31 NOTE — Plan of Care (Signed)
Problem: Consults Goal: Diagnosis- Total Joint Replacement Primary Total Knee     

## 2014-01-31 NOTE — Anesthesia Preprocedure Evaluation (Addendum)
Anesthesia Evaluation  Patient identified by MRN, date of birth, ID band Patient awake    Reviewed: Allergy & Precautions, H&P , NPO status , Patient's Chart, lab work & pertinent test results  Airway Mallampati: II      Dental   Pulmonary former smoker,  breath sounds clear to auscultation        Cardiovascular hypertension, Rhythm:Regular Rate:Normal     Neuro/Psych    GI/Hepatic Neg liver ROS, GERD-  ,  Endo/Other  diabetes  Renal/GU Renal disease     Musculoskeletal   Abdominal   Peds  Hematology   Anesthesia Other Findings   Reproductive/Obstetrics                          Anesthesia Physical Anesthesia Plan  ASA: III  Anesthesia Plan: General   Post-op Pain Management:    Induction: Intravenous  Airway Management Planned: Oral ETT  Additional Equipment:   Intra-op Plan:   Post-operative Plan: Extubation in OR  Informed Consent: I have reviewed the patients History and Physical, chart, labs and discussed the procedure including the risks, benefits and alternatives for the proposed anesthesia with the patient or authorized representative who has indicated his/her understanding and acceptance.   Dental advisory given  Plan Discussed with: CRNA, Anesthesiologist and Surgeon  Anesthesia Plan Comments:        Anesthesia Quick Evaluation

## 2014-01-31 NOTE — Progress Notes (Signed)
Orthopedic Tech Progress Note Patient Details:  [name] [name] 1935/03/02 [id number]  CPM Right Knee CPM Right Knee: On Right Knee Flexion (Degrees): 40 Right Knee Extension (Degrees): 0 Trapeze bar patient helper will be provided when one becomes available  [name] 01/31/2014, 1:13 PM

## 2014-01-31 NOTE — Transfer of Care (Signed)
Immediate Anesthesia Transfer of Care Note  Patient: [name] [name]  Procedure(s) Performed: Procedure(s): RIGHT TOTAL KNEE ARTHROPLASTY (Right)  Patient Location: PACU  Anesthesia Type:General  Level of Consciousness: awake, alert  and oriented  Airway & Oxygen Therapy: Patient Spontanous Breathing and Patient connected to nasal cannula oxygen  Post-op Assessment: Report given to PACU RN and Post -op Vital signs reviewed and stable  Post vital signs: Reviewed and stable  Complications: No apparent anesthesia complications

## 2014-01-31 NOTE — Anesthesia Procedure Notes (Addendum)
Anesthesia Regional Block:  Adductor canal block  Pre-Anesthetic Checklist: ,, timeout performed, Correct Patient, Correct Site, Correct Laterality, Correct Procedure, Correct Position, site marked, Risks and benefits discussed,  Surgical consent,  Pre-op evaluation,  At surgeon's request and post-op pain management  Laterality: Right  Prep: chloraprep        Procedures: Doppler guided, ultrasound guided (picture in chart) and nerve stimulator Adductor canal block Narrative:  Start time: 01/31/2014 9:20 AM End time: 01/31/2014 9:35 AM Injection made incrementally with aspirations every 5 mL.  Performed by: Personally  Anesthesiologist: Dr. Oletta Lamas   Procedure Name: LMA Insertion Date/Time: 01/31/2014 10:19 AM Performed by: [place] Pink Pre-anesthesia Checklist: Patient identified, Emergency Drugs available, Suction available, Patient being monitored and Timeout performed Patient Re-evaluated:Patient Re-evaluated prior to inductionOxygen Delivery Method: Circle system utilized Preoxygenation: Pre-oxygenation with 100% oxygen Intubation Type: IV induction LMA: LMA with gastric port inserted LMA Size: 5.0 Number of attempts: 1 Placement Confirmation: positive ETCO2 and breath sounds checked- equal and bilateral Tube secured with: Tape Dental Injury: Teeth and Oropharynx as per pre-operative assessment

## 2014-01-31 NOTE — Progress Notes (Signed)
Orthopedic Tech Progress Note Patient Details:  [name] [name] 15-Oct-1934 [id number]  Ortho Devices Ortho Device/Splint Location: applied ohf to bed Ortho Device/Splint Interventions: Ordered;Application   Braulio Bosch 01/31/2014, 4:22 PM

## 2014-01-31 NOTE — Op Note (Signed)
PATIENT ID:      [name] [name]  MRN:     [id number] DOB/AGE:    Apr 21, 1934 / 79 y.o.       OPERATIVE REPORT    DATE OF PROCEDURE:  01/31/2014       PREOPERATIVE DIAGNOSIS:   right knee osteoarthritis      Estimated body mass index is 28.79 kg/(m^2) as calculated from the following:   Height as of this encounter: 5\' 5"  (1.651 m).   Weight as of this encounter: 78.472 kg (173 lb).                                                        POSTOPERATIVE DIAGNOSIS:   right knee osteoarthritis                                                                      PROCEDURE:  Procedure(s): RIGHT TOTAL KNEE ARTHROPLASTY Using Depuy Sigma RP implants #4R Femur, #4Tibia, 65mm Sigma RP bearing, 38 Patella     SURGEON: Marlaysia Lenig J    ASSISTANT:   [employer]   (Present and scrubbed throughout the case, critical for assistance with exposure, retraction, instrumentation, and closure.)         ANESTHESIA: GET, ACB, Exparel  DRAINS: 2 medium hemovac in knee   TOURNIQUET TIME: 93YBO   COMPLICATIONS:  None     SPECIMENS: None   INDICATIONS FOR PROCEDURE: The patient has  right knee osteoarthritis, varus deformities, XR shows bone on bone arthritis. Patient has failed all conservative measures including anti-inflammatory medicines, narcotics, attempts at  exercise and weight loss, cortisone injections and viscosupplementation.  Risks and benefits of surgery have been discussed, questions answered.   DESCRIPTION OF PROCEDURE: The patient identified by armband, received  IV antibiotics, in the holding area at [facility]. Patient taken to the operating room, appropriate anesthetic  monitors were attached, and general endotracheal anesthesia induced with  the patient in supine position, Foley catheter was inserted. Tourniquet  applied high to the operative thigh. Lateral post and foot positioner  applied to the table, the lower extremity was then prepped and draped  in usual sterile fashion  from the ankle to the tourniquet. Time-out procedure was performed. The limb was wrapped with an Esmarch bandage and the tourniquet inflated to 350 mmHg. We began the operation by making the anterior midline incision starting at handbreadth above the patella going over the patella 1 cm medial to and  4 cm distal to the tibial tubercle. Small bleeders in the skin and the  subcutaneous tissue identified and cauterized. Transverse retinaculum was incised and reflected medially and a medial parapatellar arthrotomy was accomplished. the patella was everted and theprepatellar fat pad resected. The superficial medial collateral  ligament was then elevated from anterior to posterior along the proximal  flare of the tibia and anterior half of the menisci resected. The knee was hyperflexed exposing bone on bone arthritis. Peripheral and notch osteophytes as well as the cruciate ligaments were then resected. We continued to  work our way around posteriorly  along the proximal tibia, and externally  rotated the tibia subluxing it out from underneath the femur. A McHale  retractor was placed through the notch and a lateral Hohmann retractor  placed, and we then drilled through the proximal tibia in line with the  axis of the tibia followed by an intramedullary guide rod and 2-degree  posterior slope cutting guide. The tibial cutting guide was pinned into place  allowing resection of 4 mm of bone medially and about 12 mm of bone  laterally because of her varus deformity. Satisfied with the tibial resection, we then  entered the distal femur 2 mm anterior to the PCL origin with the  intramedullary guide rod and applied the distal femoral cutting guide  set at 42mm, with 5 degrees of valgus. This was pinned along the  epicondylar axis. At this point, the distal femoral cut was accomplished without difficulty. We then sized for a #4R femoral component and pinned the guide in 3 degrees of external rotation.The chamfer  cutting guide was pinned into place. The anterior, posterior, and chamfer cuts were accomplished without difficulty followed by  the box cutting guide and the box cut. We also removed posterior osteophytes from the posterior femoral condyles. At this  time, the knee was brought into full extension. We checked our  extension and flexion gaps and found them symmetric at 61mm.  The patella thickness measured at 25 mm. We set the cutting guide at 15 and removed the posterior 10 mm  of the patella, sized for a 38 button and drilled the lollipop. The knee  was then once again hyperflexed exposing the proximal tibia. We sized for a #4 tibial base plate, applied the smokestack and the conical reamer followed by the the Delta fin keel punch. We then hammered into place the Sigma RP trial femoral component, inserted a 10-mm trial bearing, trial patellar button, and took the knee through range of motion from 0-130 degrees. No thumb pressure was required for patellar  tracking. At this point, all trial components were removed, a double batch of DePuy HV cement with 1500 mg of Zinacef was mixed and applied to all bony metallic mating surfaces except for the posterior condyles of the femur itself. In order, we  hammered into place the tibial tray and removed excess cement, the femoral component and removed excess cement, a 10-mm Sigma RP bearing  was inserted, and the knee brought to full extension with compression.  The patellar button was clamped into place, and excess cement  removed. While the cement cured the wound was irrigated out with normal saline solution pulse lavage, and medium Hemovac drains were placed from an anterolateral  approach. Ligament stability and patellar tracking were checked and found to be excellent. The parapatellar arthrotomy was closed with  running #1 Vicryl suture. The subcutaneous tissue with 0 and 2-0 undyed  Vicryl suture, and the skin with skin staples. A dressing of Xeroform,   4 x 4, dressing sponges, Webril, and Ace wrap applied. The patient  awakened, extubated, and taken to recovery room without difficulty.   Maili Shutters J 01/31/2014, 11:54 AM

## 2014-01-31 NOTE — Anesthesia Postprocedure Evaluation (Signed)
  Anesthesia Post-op Note  Patient: [name] [name]  Procedure(s) Performed: Procedure(s): RIGHT TOTAL KNEE ARTHROPLASTY (Right)  Patient Location: PACU  Anesthesia Type:General  Level of Consciousness: awake  Airway and Oxygen Therapy: Patient Spontanous Breathing and Patient connected to nasal cannula oxygen  Post-op Pain: mild  Post-op Assessment: Post-op Vital signs reviewed, Patient's Cardiovascular Status Stable, Respiratory Function Stable, Patent Airway, No signs of Nausea or vomiting and Pain level controlled  Post-op Vital Signs: Reviewed and stable  Last Vitals:  Filed Vitals:   01/31/14 1500  BP:   Pulse: 50  Temp:   Resp: 13    Complications: No apparent anesthesia complications

## 2014-01-31 NOTE — Interval H&P Note (Signed)
History and Physical Interval Note:  01/31/2014 10:08 AM  [name] [name]  has presented today for surgery, with the diagnosis of right knee osteoarthritis  The various methods of treatment have been discussed with the patient and family. After consideration of risks, benefits and other options for treatment, the patient has consented to  Procedure(s): RIGHT TOTAL KNEE ARTHROPLASTY (Right) as a surgical intervention .  The patient's history has been reviewed, patient examined, no change in status, stable for surgery.  I have reviewed the patient's chart and labs.  Questions were answered to the patient's satisfaction.     Kerin Salen

## 2014-02-01 LAB — GLUCOSE, CAPILLARY
GLUCOSE-CAPILLARY: 189 mg/dL — AB (ref 70–99)
Glucose-Capillary: 273 mg/dL — ABNORMAL HIGH (ref 70–99)
Glucose-Capillary: 196 mg/dL — ABNORMAL HIGH (ref 70–99)
Glucose-Capillary: 203 mg/dL — ABNORMAL HIGH (ref 70–99)

## 2014-02-01 LAB — CBC
HCT: 32.3 % — ABNORMAL LOW (ref 39.0–52.0)
Hemoglobin: 10.9 g/dL — ABNORMAL LOW (ref 13.0–17.0)
MCH: 31.4 pg (ref 26.0–34.0)
MCHC: 33.7 g/dL (ref 30.0–36.0)
MCV: 93.1 fL (ref 78.0–100.0)
Platelets: 223 10*3/uL (ref 150–400)
RBC: 3.47 MIL/uL — AB (ref 4.22–5.81)
RDW: 13.5 % (ref 11.5–15.5)
WBC: 6 10*3/uL (ref 4.0–10.5)

## 2014-02-01 LAB — BASIC METABOLIC PANEL
Anion gap: 10 (ref 5–15)
BUN: 9 mg/dL (ref 6–23)
CALCIUM: 8.2 mg/dL — AB (ref 8.4–10.5)
CHLORIDE: 97 meq/L (ref 96–112)
CO2: 26 meq/L (ref 19–32)
Creatinine, Ser: 0.92 mg/dL (ref 0.50–1.35)
GFR, EST NON AFRICAN AMERICAN: 79 mL/min — AB (ref >90)
Glucose, Bld: 266 mg/dL — ABNORMAL HIGH (ref 70–99)
Potassium: 4.5 mEq/L (ref 3.7–5.3)
Sodium: 133 mEq/L — ABNORMAL LOW (ref 137–147)

## 2014-02-01 LAB — HEMOGLOBIN A1C
HEMOGLOBIN A1C: 7 % — AB (ref <5.7)
Mean Plasma Glucose: 154 mg/dL — ABNORMAL HIGH (ref <117)

## 2014-02-01 NOTE — Progress Notes (Signed)
Physical Therapy Treatment Patient Details Name: [name] [name] MRN: [id number] DOB: Jan 18, 1935 Today's Date: 02/01/2014    History of Present Illness Pt is a 79 y/o male admitted s/p elective R TKA on 01/31/14.     PT Comments    Patient progressing well. Plans to DC home tomorrow. Continue with current POC  Follow Up Recommendations  Home health PT;Supervision for mobility/OOB     Equipment Recommendations  3in1 (PT)    Recommendations for Other Services       Precautions / Restrictions Precautions Precautions: Fall Restrictions Weight Bearing Restrictions: Yes RLE Weight Bearing: Weight bearing as tolerated    Mobility  Bed Mobility Overal bed mobility: Modified Independent Bed Mobility: Supine to Sit;Sit to Supine     Supine to sit: Supervision Sit to supine: Min assist   General bed mobility comments: assistance with RLE.  Transfers Overall transfer level: Needs assistance Equipment used: Rolling walker (2 wheeled) Transfers: Sit to/from Stand Sit to Stand: Min guard         General transfer comment: cues for technique.  Ambulation/Gait Ambulation/Gait assistance: Min guard Ambulation Distance (Feet): 80 Feet Assistive device: Rolling walker (2 wheeled) Gait Pattern/deviations: Step-through pattern;Decreased stride length   Gait velocity interpretation: Below normal speed for age/gender     Stairs            Wheelchair Mobility    Modified Rankin (Stroke Patients Only)       Balance                                    Cognition Arousal/Alertness: Awake/alert Behavior During Therapy: WFL for tasks assessed/performed Overall Cognitive Status: Within Functional Limits for tasks assessed                      Exercises Total Joint Exercises Quad Sets: AROM;Right;10 reps Heel Slides: AROM;Right;10 reps Hip ABduction/ADduction: AROM;Right;10 reps Straight Leg Raises: AAROM;Right;10 reps    General Comments         Pertinent Vitals/Pain Pain Assessment: 0-10 Pain Score: 6  Pain Location: R Knee Pain Descriptors / Indicators: Aching Pain Intervention(s): Monitored during session    Home Living Family/patient expects to be discharged to:: Private residence Living Arrangements: Other relatives Available Help at Discharge: Family;Available 24 hours/day Type of Home: House Home Access: Ramped entrance   Home Layout: One level Home Equipment: Walker - 2 wheels;Cane - single point;Crutches;Shower seat;Toilet riser;Adaptive equipment      Prior Function Level of Independence: Independent      Comments: Still driving, independent with grocery shopping and maintaining the house.    PT Goals (current goals can now be found in the care plan section) Acute Rehab PT Goals Patient Stated Goal: not stated Progress towards PT goals: Progressing toward goals    Frequency  7X/week    PT Plan Current plan remains appropriate    Co-evaluation             End of Session Equipment Utilized During Treatment: Gait belt Activity Tolerance: Patient tolerated treatment well Patient left: in chair;with call bell/phone within reach     Time: 1005-1028 PT Time Calculation (min): 23 min  Charges:  $Gait Training: 8-22 mins $Therapeutic Exercise: 8-22 mins                    G Codes:      Jacqualyn Posey 02/01/2014, 11:50 AM 02/01/2014  Jacqualyn Posey PTA [phone] pager [phone] office

## 2014-02-01 NOTE — Progress Notes (Signed)
PATIENT ID: [name] [name]  MRN: [id number]  DOB/AGE:  1935-03-19 / 79 y.o.  1 Day Post-Op Procedure(s) (LRB): RIGHT TOTAL KNEE ARTHROPLASTY (Right)    PROGRESS NOTE Subjective: Patient is alert, oriented, no Nausea, no Vomiting, yes passing gas, no Bowel Movement. Taking PO well. Denies SOB, Chest or Calf Pain. Using Incentive Spirometer, PAS in place. Ambulate WBAT, CPM 0-60 Patient reports pain as mild  .    Objective: Vital signs in last 24 hours: Filed Vitals:   02/01/14 0111 02/01/14 0400 02/01/14 0502 02/01/14 0746  BP: 165/74  165/75   Pulse: 70  73   Temp: 99.3 F (37.4 C)  100 F (37.8 C)   TempSrc:      Resp: 16 14 14 14   Height:      Weight:      SpO2: 95% 96% 96%       Intake/Output from previous day: I/O last 3 completed shifts: In: 3025 [I.V.:3025] Out: 1735 [Urine:1350; Drains:350; Blood:35]   Intake/Output this shift:     LABORATORY DATA:  Recent Labs  01/31/14 1622 01/31/14 2124 02/01/14 0500 02/01/14 0621  WBC  --   --  6.0  --   HGB  --   --  10.9*  --   HCT  --   --  32.3*  --   PLT  --   --  223  --   NA  --   --  133*  --   K  --   --  4.5  --   CL  --   --  97  --   CO2  --   --  26  --   BUN  --   --  9  --   CREATININE  --   --  0.92  --   GLUCOSE  --   --  266*  --   GLUCAP 144* 225*  --  273*  CALCIUM  --   --  8.2*  --     Examination: Neurologically intact Neurovascular intact Sensation intact distally Intact pulses distally Dorsiflexion/Plantar flexion intact Incision: dressing C/D/I No cellulitis present Compartment soft}  Blood and plasma separated in drain indicating minimal recent drainage, drain pulled without difficulty.  Assessment:   1 Day Post-Op Procedure(s) (LRB): RIGHT TOTAL KNEE ARTHROPLASTY (Right) ADDITIONAL DIAGNOSIS: Expected Acute Blood Loss Anemia, Diabetes and Hypertension  Plan: PT/OT WBAT, CPM 5/hrs day until ROM 0-90 degrees, then D/C CPM DVT Prophylaxis:  SCDx72hrs, ASA 325 mg BID x 2  weeks DISCHARGE PLAN: Home, once pt meets therapy goals. DISCHARGE NEEDS: HHPT, HHRN, CPM, Walker and 3-in-1 comode seat     Dewayne Jurek R 02/01/2014, 8:35 AM

## 2014-02-01 NOTE — Evaluation (Signed)
Occupational Therapy Evaluation Patient Details Name: [name] [name] MRN: [id number] DOB: [birth year] Today's Date: 02/01/2014    History of Present Illness Pt is a 79 y/o male admitted s/p elective R TKA on 01/31/14.    Clinical Impression   Pt s/p above. Pt independent with ADLs, PTA. Feel pt will benefit from acute OT to increase strength, safety, and independence prior to d/c.     Follow Up Recommendations  No OT follow up;Supervision - Intermittent    Equipment Recommendations  None recommended by OT    Recommendations for Other Services       Precautions / Restrictions Precautions Precautions: Fall Restrictions Weight Bearing Restrictions: Yes RLE Weight Bearing: Weight bearing as tolerated      Mobility Bed Mobility Overal bed mobility: Needs Assistance Bed Mobility: Supine to Sit;Sit to Supine     Supine to sit: Supervision Sit to supine: Min assist   General bed mobility comments: assistance with RLE.  Transfers Overall transfer level: Needs assistance Equipment used: Rolling walker (2 wheeled) Transfers: Sit to/from Stand Sit to Stand: Min guard;Min assist         General transfer comment: cues for technique.    Balance                                            ADL Overall ADL's : Needs assistance/impaired     Grooming: Wash/dry face;Oral care;Min guard;Standing           Upper Body Dressing : Set up;Sitting   Lower Body Dressing: Minimal assistance;Sit to/from stand   Toilet Transfer: Minimal assistance;Ambulation;RW;BSC           Functional mobility during ADLs: Minimal assistance;Rolling walker General ADL Comments: Educated on safety tips such as- (rugs, safe shoewear, sitting for most of LB ADLs, clutter). Educated on dressing technique and explained benefit of trying to reach to donn/doff right sock as it allows knee to bend. Explained use of reacher. Educated on use of bag on walker. Educated on tub transfer  technique and recommended pt not step over tub now.      Vision                     Perception     Praxis      Pertinent Vitals/Pain Pain Assessment: 0-10 Pain Score: 7  Pain Location: Right knee Pain Descriptors / Indicators: Aching Pain Intervention(s): Monitored during session     Hand Dominance Right   Extremity/Trunk Assessment Upper Extremity Assessment Upper Extremity Assessment: Overall WFL for tasks assessed   Lower Extremity Assessment Lower Extremity Assessment: Defer to PT evaluation       Communication Communication Communication: No difficulties   Cognition Arousal/Alertness: Awake/alert Behavior During Therapy: WFL for tasks assessed/performed Overall Cognitive Status: Within Functional Limits for tasks assessed                     General Comments       Exercises       Shoulder Instructions      Home Living Family/patient expects to be discharged to:: Private residence Living Arrangements: Other relatives Available Help at Discharge: Family;Available 24 hours/day Type of Home: House Home Access: Ramped entrance     Home Layout: One level     Bathroom Shower/Tub: Teacher, early years/pre: Standard     Home Equipment: Environmental consultant -  2 wheels;Cane - single point;Crutches;Shower seat;Toilet riser;Adaptive equipment Adaptive Equipment: Reacher;Sock aid;Long-handled shoe horn;Long-handled sponge        Prior Functioning/Environment Level of Independence: Independent        Comments: Still driving, independent with grocery shopping and maintaining the house.     OT Diagnosis: Acute pain   OT Problem List: Decreased strength;Decreased range of motion;Decreased activity tolerance;Impaired balance (sitting and/or standing);Decreased knowledge of use of DME or AE;Decreased knowledge of precautions;Pain   OT Treatment/Interventions: Self-care/ADL training;DME and/or AE instruction;Therapeutic  activities;Patient/family education;Balance training    OT Goals(Current goals can be found in the care plan section) Acute Rehab OT Goals Patient Stated Goal: not stated OT Goal Formulation: With patient Time For Goal Achievement: 02/08/14 Potential to Achieve Goals: Good ADL Goals Pt Will Perform Lower Body Dressing: with modified independence;sit to/from stand Pt Will Transfer to Toilet: with modified independence;ambulating (elevated toilet) Pt Will Perform Toileting - Clothing Manipulation and hygiene: with modified independence;sit to/from stand  OT Frequency: Min 2X/week   Barriers to D/C:            Co-evaluation              End of Session Equipment Utilized During Treatment: Gait belt;Rolling walker CPM Right Knee CPM Right Knee: Off  Activity Tolerance: Patient tolerated treatment well Patient left: in bed;with call bell/phone within reach   Time: 2694-8546 OT Time Calculation (min): 27 min Charges:  OT General Charges $OT Visit: 1 Procedure OT Evaluation $Initial OT Evaluation Tier I: 1 Procedure OT Treatments $Self Care/Home Management : 8-22 mins G-CodesBenito Mccreedy OTR/L [phone] 02/01/2014, 9:24 AM

## 2014-02-02 LAB — CBC
HEMATOCRIT: 29.2 % — AB (ref 39.0–52.0)
HEMOGLOBIN: 9.8 g/dL — AB (ref 13.0–17.0)
MCH: 30.9 pg (ref 26.0–34.0)
MCHC: 33.6 g/dL (ref 30.0–36.0)
MCV: 92.1 fL (ref 78.0–100.0)
Platelets: 203 10*3/uL (ref 150–400)
RBC: 3.17 MIL/uL — ABNORMAL LOW (ref 4.22–5.81)
RDW: 13.4 % (ref 11.5–15.5)
WBC: 7.7 10*3/uL (ref 4.0–10.5)

## 2014-02-02 LAB — GLUCOSE, CAPILLARY
Glucose-Capillary: 181 mg/dL — ABNORMAL HIGH (ref 70–99)
Glucose-Capillary: 170 mg/dL — ABNORMAL HIGH (ref 70–99)

## 2014-02-02 NOTE — Discharge Summary (Signed)
Patient ID: [name] [name] MRN: [id number] DOB/AGE: May 02, 1934 79 y.o.  Admit date: 01/31/2014 Discharge date: 02/02/2014  Admission Diagnoses:  Active Problems:   Arthritis of knee   Discharge Diagnoses:  Same  Past Medical History  Diagnosis Date  . Hyperlipidemia     5 yrs  . Diverticulosis 2009  . Hypertension   . Diabetes mellitus 1955    type 2 - fasting 100-120  . GERD (gastroesophageal reflux disease)   . History of shingles   . Glaucoma     bilateral  . Osteoarthritis     "knees" (01/31/2014)  . Prostate cancer     "no treatments; it's going away by itself w/the pills I take"    Surgeries: Procedure(s): RIGHT TOTAL KNEE ARTHROPLASTY on 01/31/2014   Consultants:    Discharged Condition: Improved  Hospital Course: Jayquon Theiler is an 79 y.o. male who was admitted 01/31/2014 for operative treatment of<principal problem not specified>. Patient has severe unremitting pain that affects sleep, daily activities, and work/hobbies. After pre-op clearance the patient was taken to the operating room on 01/31/2014 and underwent  Procedure(s): RIGHT TOTAL KNEE ARTHROPLASTY.    Patient was given perioperative antibiotics: Anti-infectives   Start     Dose/Rate Route Frequency Ordered Stop   01/31/14 1043  cefUROXime (ZINACEF) injection  Status:  Discontinued       As needed 01/31/14 1044 01/31/14 1212   01/31/14 0600  ceFAZolin (ANCEF) IVPB 2 g/50 mL premix     2 g 100 mL/hr over 30 Minutes Intravenous On call to O.R. 01/30/14 1407 01/31/14 1026       Patient was given sequential compression devices, early ambulation, and chemoprophylaxis to prevent DVT.  Patient benefited maximally from hospital stay and there were no complications.    Recent vital signs: Patient Vitals for the past 24 hrs:  BP Temp Temp src Pulse Resp SpO2  02/02/14 0549 131/55 mmHg 98.3 F (36.8 C) - 99 16 98 %  02/02/14 0400 - - - - 16 100 %  02/02/14 0000 - - - - 16 99 %  02/01/14 2000 132/53  mmHg 98.7 F (37.1 C) Oral 87 16 98 %  02/01/14 1811 144/58 mmHg 98.8 F (37.1 C) Oral 88 16 98 %  02/01/14 1600 - - - - 12 -  02/01/14 1200 - - - - 12 -     Recent laboratory studies:  Recent Labs  02/01/14 0500 02/02/14 0420  WBC 6.0 7.7  HGB 10.9* 9.8*  HCT 32.3* 29.2*  PLT 223 203  NA 133*  --   K 4.5  --   CL 97  --   CO2 26  --   BUN 9  --   CREATININE 0.92  --   GLUCOSE 266*  --   CALCIUM 8.2*  --      Discharge Medications:     Medication List    STOP taking these medications       HYDROcodone-acetaminophen 5-325 MG per tablet  Commonly known as:  NORCO/VICODIN     naproxen sodium 220 MG tablet  Commonly known as:  ANAPROX      TAKE these medications       aspirin EC 325 MG tablet  Take 1 tablet (325 mg total) by mouth 2 (two) times daily.     finasteride 5 MG tablet  Commonly known as:  PROSCAR  Take 5 mg by mouth daily.     furosemide 20 MG tablet  Commonly known as:  LASIX  Take 20 mg by mouth every other day.     gabapentin 600 MG tablet  Commonly known as:  NEURONTIN  Take 600 mg by mouth 2 (two) times daily.     glipiZIDE 5 MG tablet  Commonly known as:  GLUCOTROL  Take 5 mg by mouth 2 (two) times daily.     latanoprost 0.005 % ophthalmic solution  Commonly known as:  XALATAN  Place 1 drop into both eyes at bedtime.     metFORMIN 500 MG tablet  Commonly known as:  GLUCOPHAGE  Take 500 mg by mouth 2 (two) times daily with a meal.     methocarbamol 500 MG tablet  Commonly known as:  ROBAXIN  Take 1 tablet (500 mg total) by mouth 2 (two) times daily with a meal.     omeprazole 20 MG capsule  Commonly known as:  PRILOSEC  Take 20 mg by mouth daily.     oxyCODONE-acetaminophen 5-325 MG per tablet  Commonly known as:  ROXICET  Take 1 tablet by mouth every 4 (four) hours as needed.     ramipril 2.5 MG tablet  Commonly known as:  ALTACE  Take 1 tablet (2.5 mg total) by mouth daily.     simvastatin 40 MG tablet  Commonly  known as:  ZOCOR  Take 40 mg by mouth daily.        Diagnostic Studies: Dg Chest 2 View  01/31/2014   CLINICAL DATA:  Preoperative evaluation for right knee replacement  EXAM: CHEST  2 VIEW  COMPARISON:  None.  FINDINGS: The heart size and mediastinal contours are within normal limits. Both lungs are clear. The visualized skeletal structures are unremarkable.  IMPRESSION: No active cardiopulmonary disease.   Electronically Signed   By: Inez Catalina M.D.   On: 01/31/2014 08:35    Disposition: 01-Home or Self Care      Discharge Instructions   CPM    Complete by:  As directed   Continuous passive motion machine (CPM):      Use the CPM from 0 to 60  for 5 hours per day.      You may increase by 10 degrees per day.  You may break it up into 2 or 3 sessions per day.      Use CPM for 2 weeks or until you are told to stop.     Call MD / Call 911    Complete by:  As directed   If you experience chest pain or shortness of breath, CALL 911 and be transported to the hospital emergency room.  If you develope a fever above 101 F, pus (white drainage) or increased drainage or redness at the wound, or calf pain, call your surgeon's office.     Change dressing    Complete by:  As directed   Change dressing on 5, then change the dressing daily with sterile 4 x 4 inch gauze dressing and apply TED hose.  You may clean the incision with alcohol prior to redressing.     Constipation Prevention    Complete by:  As directed   Drink plenty of fluids.  Prune juice may be helpful.  You may use a stool softener, such as Colace (over the counter) 100 mg twice a day.  Use MiraLax (over the counter) for constipation as needed.     Diet - low sodium heart healthy    Complete by:  As directed      Discharge instructions  Complete by:  As directed   Follow up in office with Dr. Mayer Camel in 2 weeks.     Driving restrictions    Complete by:  As directed   No driving for 2 weeks     Increase activity slowly as  tolerated    Complete by:  As directed      Patient may shower    Complete by:  As directed   You may shower without a dressing once there is no drainage.  Do not wash over the wound.  If drainage remains, cover wound with plastic wrap and then shower.           Follow-up Information   Follow up with Kerin Salen, MD In 2 weeks.   Specialty:  Orthopedic Surgery   Contact information:   [place] [phone]        Signed: Hardin Negus Rhonda Vangieson R 02/02/2014, 8:26 AM

## 2014-02-02 NOTE — Progress Notes (Signed)
PATIENT ID: [name] [name]  MRN: [id number]  DOB/AGE:  11/01/1934 / 79 y.o. y.o.  2 Days Post-Op Procedure(s) (LRB): RIGHT TOTAL KNEE ARTHROPLASTY (Right)    PROGRESS NOTE Subjective: Patient is alert, oriented, no Nausea, no Vomiting, yes passing gas, no Bowel Movement. Taking PO well. Denies SOB, Chest or Calf Pain. Using Incentive Spirometer, PAS in place. Ambulate WBAT, CPM 0-60 Patient reports pain as 5 on 0-10 scale  .    Objective: Vital signs in last 24 hours: Filed Vitals:   02/01/14 2000 02/02/14 0000 02/02/14 0400 02/02/14 0549  BP: 132/53   131/55  Pulse: 87   99  Temp: 98.7 F (37.1 C)   98.3 F (36.8 C)  TempSrc: Oral     Resp: 16 16 16 16   Height:      Weight:      SpO2: 98% 99% 100% 98%      Intake/Output from previous day: I/O last 3 completed shifts: In: 1325 [I.V.:1325] Out: 1175 [Urine:1100; Drains:75]   Intake/Output this shift:     LABORATORY DATA:  Recent Labs  02/01/14 0500  02/01/14 1619 02/01/14 2129 02/02/14 0420 02/02/14 0634  WBC 6.0  --   --   --  7.7  --   HGB 10.9*  --   --   --  9.8*  --   HCT 32.3*  --   --   --  29.2*  --   PLT 223  --   --   --  203  --   NA 133*  --   --   --   --   --   K 4.5  --   --   --   --   --   CL 97  --   --   --   --   --   CO2 26  --   --   --   --   --   BUN 9  --   --   --   --   --   CREATININE 0.92  --   --   --   --   --   GLUCOSE 266*  --   --   --   --   --   GLUCAP  --   < > 189* 203*  --  181*  CALCIUM 8.2*  --   --   --   --   --   < > = values in this interval not displayed.  Examination: Neurologically intact Neurovascular intact Sensation intact distally Intact pulses distally Dorsiflexion/Plantar flexion intact Incision: dressing C/D/I No cellulitis present Compartment soft}  Assessment:   2 Days Post-Op Procedure(s) (LRB): RIGHT TOTAL KNEE ARTHROPLASTY (Right) ADDITIONAL DIAGNOSIS: Expected Acute Blood Loss Anemia, Diabetes and Hypertension  Plan: PT/OT WBAT, CPM 5/hrs day  until ROM 0-90 degrees, then D/C CPM DVT Prophylaxis:  SCDx72hrs, ASA 325 mg BID x 2 weeks DISCHARGE PLAN: Home, once pt passes therapy goals. DISCHARGE NEEDS: HHPT, HHRN, CPM, Walker and 3-in-1 comode seat     Carlei Huang R 02/02/2014, 8:22 AM

## 2014-02-02 NOTE — Progress Notes (Signed)
Physical Therapy Treatment Patient Details Name: [name] [name] MRN: [id number] DOB: 24-Apr-1934 Today's Date: 27-Feb-2014    History of Present Illness Pt is a 79 y/o male admitted s/p elective R TKA on 01/31/14.     PT Comments    Pt making steady progress with mobility. Safe to discharge home with family assistance at current functional level.  Follow Up Recommendations  Home health PT;Supervision for mobility/OOB     Equipment Recommendations  3in1 (PT)       Precautions / Restrictions Precautions Precautions: Fall Restrictions Weight Bearing Restrictions: Yes RLE Weight Bearing: Weight bearing as tolerated    Mobility  Bed Mobility Overal bed mobility: Needs Assistance Bed Mobility: Supine to Sit     Supine to sit: Supervision     General bed mobility comments: bed flat and no rails used. cues on technique with increased time needed.  Transfers Overall transfer level: Needs assistance Equipment used: Rolling walker (2 wheeled) Transfers: Sit to/from Stand Sit to Stand: Min guard         General transfer comment: cues for technique, ant. weight shift and hand placement.  Ambulation/Gait Ambulation/Gait assistance: Supervision Ambulation Distance (Feet): 130 Feet Assistive device: Rolling walker (2 wheeled) Gait Pattern/deviations: Antalgic;Step-through pattern;Decreased stride length (mildly ataxic/tremoring? trunk/extremeties with gait as well) Gait velocity: decreased Gait velocity interpretation: Below normal speed for age/gender General Gait Details: cues on posture, walker position with gait and incr'd stride length.   Stairs            Wheelchair Mobility    Modified Rankin (Stroke Patients Only)          Cognition Arousal/Alertness: Awake/alert Behavior During Therapy: WFL for tasks assessed/performed Overall Cognitive Status: Within Functional Limits for tasks assessed                      Exercises Total Joint  Exercises Ankle Circles/Pumps: AROM;Both;10 reps;Supine Quad Sets: AROM;Strengthening;Right;10 reps;Supine Heel Slides: AAROM;Strengthening;Right;10 reps;Supine Straight Leg Raises: AAROM;Strengthening;Right;10 reps;Supine Goniometric ROM: supine right knee AAROM: 5 to 70 degrees flexion        Pertinent Vitals/Pain Pain Assessment: 0-10 Pain Score: 4  Pain Location: right knee Pain Descriptors / Indicators: Sore Pain Intervention(s): Monitored during session;Premedicated before session;Repositioned;RN gave pain meds during session           PT Goals (current goals can now be found in the care plan section) Acute Rehab PT Goals Patient Stated Goal: not stated PT Goal Formulation: With patient/family Time For Goal Achievement: 02/07/14 Potential to Achieve Goals: Good Progress towards PT goals: Progressing toward goals    Frequency  7X/week    PT Plan Current plan remains appropriate       End of Session Equipment Utilized During Treatment: Gait belt Activity Tolerance: Patient tolerated treatment well Patient left: in chair;with call bell/phone within reach;with family/visitor present     Time: 9767-3419 PT Time Calculation (min): 27 min  Charges:  $Gait Training: 8-22 mins $Therapeutic Exercise: 8-22 mins                    G Codes:      Willow Ora 02/27/14, 10:30 AM  Willow Ora, PTA Office- [phone]

## 2014-02-02 NOTE — Care Management Note (Signed)
    Page 1 of 2   02/02/2014     1:03:58 PM CARE MANAGEMENT NOTE 02/02/2014  Patient:  [name],[name]   Account Number:  [id number]  Date Initiated:  02/02/2014  Documentation initiated by:  [facility]  Subjective/Objective Assessment:   adm: RIGHT TOTAL KNEE ARTHROPLASTY (Right)     Action/Plan:   dishcarge planning   Anticipated DC Date:  02/02/2014   Anticipated DC Plan:  [place]  CM consult      [facility] Choice  HOME HEALTH   Choice offered to / List presented to:  C-1 Patient   DME arranged  NA      DME agency  NA     [place] arranged  HH-1 RN  HH-2 PT  HH-3 OT      Status of service:  Completed, signed off Medicare Important Message given?   (If response is "NO", the following Medicare IM given date fields will be blank) Date Medicare IM given:   Medicare IM given by:   Date Additional Medicare IM given:   Additional Medicare IM given by:    Discharge Disposition:  [place]  Per UR Regulation:    If discussed at Long Length of Stay Meetings, dates discussed:    Comments:  02/02/14 13:00 Cm met with pt in room to offer choice of home health agency.  [name]. [name] and daughter confirms. Address and contact information verified with pt.  Referral called to [facility] rep, Lecretia.  No other CM needs were communicated.  Mariane Masters, BSN, CM [phone].

## 2014-02-02 NOTE — Progress Notes (Signed)
OT Cancellation Note  Patient Details Name: Menashe Kafer MRN: [id number] DOB: 10-01-1934   Cancelled Treatment:     Pt with no further questions/concerns for OT prior to d/c.   Benito Mccreedy OTR/L [phone] 02/02/2014, 11:25 AM

## 2014-02-04 ENCOUNTER — Encounter (HOSPITAL_COMMUNITY): Payer: Self-pay | Admitting: Orthopedic Surgery

## 2014-02-04 DIAGNOSIS — I10 Essential (primary) hypertension: Secondary | ICD-10-CM | POA: Diagnosis not present

## 2014-02-04 DIAGNOSIS — Z96651 Presence of right artificial knee joint: Secondary | ICD-10-CM | POA: Diagnosis not present

## 2014-02-04 DIAGNOSIS — C61 Malignant neoplasm of prostate: Secondary | ICD-10-CM | POA: Diagnosis not present

## 2014-02-04 DIAGNOSIS — Z471 Aftercare following joint replacement surgery: Secondary | ICD-10-CM | POA: Diagnosis not present

## 2014-02-04 DIAGNOSIS — M1711 Unilateral primary osteoarthritis, right knee: Secondary | ICD-10-CM | POA: Diagnosis not present

## 2014-02-04 DIAGNOSIS — E119 Type 2 diabetes mellitus without complications: Secondary | ICD-10-CM | POA: Diagnosis not present

## 2014-02-05 DIAGNOSIS — E119 Type 2 diabetes mellitus without complications: Secondary | ICD-10-CM | POA: Diagnosis not present

## 2014-02-05 DIAGNOSIS — Z471 Aftercare following joint replacement surgery: Secondary | ICD-10-CM | POA: Diagnosis not present

## 2014-02-05 DIAGNOSIS — M1711 Unilateral primary osteoarthritis, right knee: Secondary | ICD-10-CM | POA: Diagnosis not present

## 2014-02-05 DIAGNOSIS — I10 Essential (primary) hypertension: Secondary | ICD-10-CM | POA: Diagnosis not present

## 2014-02-05 DIAGNOSIS — C61 Malignant neoplasm of prostate: Secondary | ICD-10-CM | POA: Diagnosis not present

## 2014-02-05 DIAGNOSIS — Z96651 Presence of right artificial knee joint: Secondary | ICD-10-CM | POA: Diagnosis not present

## 2014-02-06 DIAGNOSIS — Z96651 Presence of right artificial knee joint: Secondary | ICD-10-CM | POA: Diagnosis not present

## 2014-02-06 DIAGNOSIS — Z471 Aftercare following joint replacement surgery: Secondary | ICD-10-CM | POA: Diagnosis not present

## 2014-02-06 DIAGNOSIS — M1711 Unilateral primary osteoarthritis, right knee: Secondary | ICD-10-CM | POA: Diagnosis not present

## 2014-02-06 DIAGNOSIS — E119 Type 2 diabetes mellitus without complications: Secondary | ICD-10-CM | POA: Diagnosis not present

## 2014-02-06 DIAGNOSIS — I10 Essential (primary) hypertension: Secondary | ICD-10-CM | POA: Diagnosis not present

## 2014-02-06 DIAGNOSIS — C61 Malignant neoplasm of prostate: Secondary | ICD-10-CM | POA: Diagnosis not present

## 2014-02-07 DIAGNOSIS — E119 Type 2 diabetes mellitus without complications: Secondary | ICD-10-CM | POA: Diagnosis not present

## 2014-02-07 DIAGNOSIS — M1711 Unilateral primary osteoarthritis, right knee: Secondary | ICD-10-CM | POA: Diagnosis not present

## 2014-02-07 DIAGNOSIS — Z471 Aftercare following joint replacement surgery: Secondary | ICD-10-CM | POA: Diagnosis not present

## 2014-02-07 DIAGNOSIS — Z96651 Presence of right artificial knee joint: Secondary | ICD-10-CM | POA: Diagnosis not present

## 2014-02-07 DIAGNOSIS — I10 Essential (primary) hypertension: Secondary | ICD-10-CM | POA: Diagnosis not present

## 2014-02-07 DIAGNOSIS — C61 Malignant neoplasm of prostate: Secondary | ICD-10-CM | POA: Diagnosis not present

## 2014-02-08 DIAGNOSIS — C61 Malignant neoplasm of prostate: Secondary | ICD-10-CM | POA: Diagnosis not present

## 2014-02-08 DIAGNOSIS — Z471 Aftercare following joint replacement surgery: Secondary | ICD-10-CM | POA: Diagnosis not present

## 2014-02-08 DIAGNOSIS — M1711 Unilateral primary osteoarthritis, right knee: Secondary | ICD-10-CM | POA: Diagnosis not present

## 2014-02-08 DIAGNOSIS — Z96651 Presence of right artificial knee joint: Secondary | ICD-10-CM | POA: Diagnosis not present

## 2014-02-08 DIAGNOSIS — I10 Essential (primary) hypertension: Secondary | ICD-10-CM | POA: Diagnosis not present

## 2014-02-08 DIAGNOSIS — E119 Type 2 diabetes mellitus without complications: Secondary | ICD-10-CM | POA: Diagnosis not present

## 2014-02-10 DIAGNOSIS — Z471 Aftercare following joint replacement surgery: Secondary | ICD-10-CM | POA: Diagnosis not present

## 2014-02-10 DIAGNOSIS — E119 Type 2 diabetes mellitus without complications: Secondary | ICD-10-CM | POA: Diagnosis not present

## 2014-02-10 DIAGNOSIS — M1711 Unilateral primary osteoarthritis, right knee: Secondary | ICD-10-CM | POA: Diagnosis not present

## 2014-02-10 DIAGNOSIS — I10 Essential (primary) hypertension: Secondary | ICD-10-CM | POA: Diagnosis not present

## 2014-02-10 DIAGNOSIS — Z96651 Presence of right artificial knee joint: Secondary | ICD-10-CM | POA: Diagnosis not present

## 2014-02-10 DIAGNOSIS — C61 Malignant neoplasm of prostate: Secondary | ICD-10-CM | POA: Diagnosis not present

## 2014-02-11 DIAGNOSIS — M1711 Unilateral primary osteoarthritis, right knee: Secondary | ICD-10-CM | POA: Diagnosis not present

## 2014-02-11 DIAGNOSIS — C61 Malignant neoplasm of prostate: Secondary | ICD-10-CM | POA: Diagnosis not present

## 2014-02-11 DIAGNOSIS — I10 Essential (primary) hypertension: Secondary | ICD-10-CM | POA: Diagnosis not present

## 2014-02-11 DIAGNOSIS — Z471 Aftercare following joint replacement surgery: Secondary | ICD-10-CM | POA: Diagnosis not present

## 2014-02-11 DIAGNOSIS — Z96651 Presence of right artificial knee joint: Secondary | ICD-10-CM | POA: Diagnosis not present

## 2014-02-11 DIAGNOSIS — E119 Type 2 diabetes mellitus without complications: Secondary | ICD-10-CM | POA: Diagnosis not present

## 2014-02-12 DIAGNOSIS — I10 Essential (primary) hypertension: Secondary | ICD-10-CM | POA: Diagnosis not present

## 2014-02-12 DIAGNOSIS — C61 Malignant neoplasm of prostate: Secondary | ICD-10-CM | POA: Diagnosis not present

## 2014-02-12 DIAGNOSIS — M1711 Unilateral primary osteoarthritis, right knee: Secondary | ICD-10-CM | POA: Diagnosis not present

## 2014-02-12 DIAGNOSIS — Z471 Aftercare following joint replacement surgery: Secondary | ICD-10-CM | POA: Diagnosis not present

## 2014-02-12 DIAGNOSIS — E119 Type 2 diabetes mellitus without complications: Secondary | ICD-10-CM | POA: Diagnosis not present

## 2014-02-12 DIAGNOSIS — Z96651 Presence of right artificial knee joint: Secondary | ICD-10-CM | POA: Diagnosis not present

## 2014-02-13 DIAGNOSIS — I10 Essential (primary) hypertension: Secondary | ICD-10-CM | POA: Diagnosis not present

## 2014-02-13 DIAGNOSIS — Z96651 Presence of right artificial knee joint: Secondary | ICD-10-CM | POA: Diagnosis not present

## 2014-02-13 DIAGNOSIS — Z471 Aftercare following joint replacement surgery: Secondary | ICD-10-CM | POA: Diagnosis not present

## 2014-02-13 DIAGNOSIS — Z96659 Presence of unspecified artificial knee joint: Secondary | ICD-10-CM | POA: Diagnosis not present

## 2014-02-13 DIAGNOSIS — C61 Malignant neoplasm of prostate: Secondary | ICD-10-CM | POA: Diagnosis not present

## 2014-02-13 DIAGNOSIS — M1711 Unilateral primary osteoarthritis, right knee: Secondary | ICD-10-CM | POA: Diagnosis not present

## 2014-02-13 DIAGNOSIS — E119 Type 2 diabetes mellitus without complications: Secondary | ICD-10-CM | POA: Diagnosis not present

## 2014-02-14 DIAGNOSIS — I10 Essential (primary) hypertension: Secondary | ICD-10-CM | POA: Diagnosis not present

## 2014-02-14 DIAGNOSIS — Z471 Aftercare following joint replacement surgery: Secondary | ICD-10-CM | POA: Diagnosis not present

## 2014-02-14 DIAGNOSIS — C61 Malignant neoplasm of prostate: Secondary | ICD-10-CM | POA: Diagnosis not present

## 2014-02-14 DIAGNOSIS — M1711 Unilateral primary osteoarthritis, right knee: Secondary | ICD-10-CM | POA: Diagnosis not present

## 2014-02-14 DIAGNOSIS — Z96651 Presence of right artificial knee joint: Secondary | ICD-10-CM | POA: Diagnosis not present

## 2014-02-14 DIAGNOSIS — E119 Type 2 diabetes mellitus without complications: Secondary | ICD-10-CM | POA: Diagnosis not present

## 2014-02-17 DIAGNOSIS — M25661 Stiffness of right knee, not elsewhere classified: Secondary | ICD-10-CM | POA: Diagnosis not present

## 2014-02-17 DIAGNOSIS — Z96651 Presence of right artificial knee joint: Secondary | ICD-10-CM | POA: Diagnosis not present

## 2014-02-17 DIAGNOSIS — M25561 Pain in right knee: Secondary | ICD-10-CM | POA: Diagnosis not present

## 2014-02-20 DIAGNOSIS — M25661 Stiffness of right knee, not elsewhere classified: Secondary | ICD-10-CM | POA: Diagnosis not present

## 2014-02-20 DIAGNOSIS — Z96651 Presence of right artificial knee joint: Secondary | ICD-10-CM | POA: Diagnosis not present

## 2014-02-20 DIAGNOSIS — M25561 Pain in right knee: Secondary | ICD-10-CM | POA: Diagnosis not present

## 2014-02-24 DIAGNOSIS — I10 Essential (primary) hypertension: Secondary | ICD-10-CM | POA: Diagnosis not present

## 2014-02-24 DIAGNOSIS — E119 Type 2 diabetes mellitus without complications: Secondary | ICD-10-CM | POA: Diagnosis not present

## 2014-02-24 DIAGNOSIS — M25561 Pain in right knee: Secondary | ICD-10-CM | POA: Diagnosis not present

## 2014-02-24 DIAGNOSIS — E785 Hyperlipidemia, unspecified: Secondary | ICD-10-CM | POA: Diagnosis not present

## 2014-02-24 DIAGNOSIS — M25661 Stiffness of right knee, not elsewhere classified: Secondary | ICD-10-CM | POA: Diagnosis not present

## 2014-02-24 DIAGNOSIS — Z96651 Presence of right artificial knee joint: Secondary | ICD-10-CM | POA: Diagnosis not present

## 2014-02-24 DIAGNOSIS — D649 Anemia, unspecified: Secondary | ICD-10-CM | POA: Diagnosis not present

## 2014-02-24 DIAGNOSIS — M129 Arthropathy, unspecified: Secondary | ICD-10-CM | POA: Diagnosis not present

## 2014-02-24 DIAGNOSIS — K30 Functional dyspepsia: Secondary | ICD-10-CM | POA: Diagnosis not present

## 2014-02-24 DIAGNOSIS — E559 Vitamin D deficiency, unspecified: Secondary | ICD-10-CM | POA: Diagnosis not present

## 2014-02-27 DIAGNOSIS — M25661 Stiffness of right knee, not elsewhere classified: Secondary | ICD-10-CM | POA: Diagnosis not present

## 2014-02-27 DIAGNOSIS — M25561 Pain in right knee: Secondary | ICD-10-CM | POA: Diagnosis not present

## 2014-02-27 DIAGNOSIS — Z96651 Presence of right artificial knee joint: Secondary | ICD-10-CM | POA: Diagnosis not present

## 2014-03-03 DIAGNOSIS — M25661 Stiffness of right knee, not elsewhere classified: Secondary | ICD-10-CM | POA: Diagnosis not present

## 2014-03-03 DIAGNOSIS — M25561 Pain in right knee: Secondary | ICD-10-CM | POA: Diagnosis not present

## 2014-03-03 DIAGNOSIS — Z96651 Presence of right artificial knee joint: Secondary | ICD-10-CM | POA: Diagnosis not present

## 2014-03-10 DIAGNOSIS — M25661 Stiffness of right knee, not elsewhere classified: Secondary | ICD-10-CM | POA: Diagnosis not present

## 2014-03-10 DIAGNOSIS — Z96651 Presence of right artificial knee joint: Secondary | ICD-10-CM | POA: Diagnosis not present

## 2014-03-10 DIAGNOSIS — M25561 Pain in right knee: Secondary | ICD-10-CM | POA: Diagnosis not present

## 2014-03-13 DIAGNOSIS — M25661 Stiffness of right knee, not elsewhere classified: Secondary | ICD-10-CM | POA: Diagnosis not present

## 2014-03-13 DIAGNOSIS — Z96651 Presence of right artificial knee joint: Secondary | ICD-10-CM | POA: Diagnosis not present

## 2014-03-13 DIAGNOSIS — M25561 Pain in right knee: Secondary | ICD-10-CM | POA: Diagnosis not present

## 2014-03-17 DIAGNOSIS — Z96651 Presence of right artificial knee joint: Secondary | ICD-10-CM | POA: Diagnosis not present

## 2014-03-17 DIAGNOSIS — M25651 Stiffness of right hip, not elsewhere classified: Secondary | ICD-10-CM | POA: Diagnosis not present

## 2014-03-17 DIAGNOSIS — M25661 Stiffness of right knee, not elsewhere classified: Secondary | ICD-10-CM | POA: Diagnosis not present

## 2014-03-20 DIAGNOSIS — M25561 Pain in right knee: Secondary | ICD-10-CM | POA: Diagnosis not present

## 2014-03-20 DIAGNOSIS — Z96651 Presence of right artificial knee joint: Secondary | ICD-10-CM | POA: Diagnosis not present

## 2014-03-20 DIAGNOSIS — M25661 Stiffness of right knee, not elsewhere classified: Secondary | ICD-10-CM | POA: Diagnosis not present

## 2014-03-25 DIAGNOSIS — E559 Vitamin D deficiency, unspecified: Secondary | ICD-10-CM | POA: Diagnosis not present

## 2014-03-25 DIAGNOSIS — G4700 Insomnia, unspecified: Secondary | ICD-10-CM | POA: Diagnosis not present

## 2014-03-25 DIAGNOSIS — M170 Bilateral primary osteoarthritis of knee: Secondary | ICD-10-CM | POA: Diagnosis not present

## 2014-03-25 DIAGNOSIS — E785 Hyperlipidemia, unspecified: Secondary | ICD-10-CM | POA: Diagnosis not present

## 2014-03-25 DIAGNOSIS — E119 Type 2 diabetes mellitus without complications: Secondary | ICD-10-CM | POA: Diagnosis not present

## 2014-03-25 DIAGNOSIS — I119 Hypertensive heart disease without heart failure: Secondary | ICD-10-CM | POA: Diagnosis not present

## 2014-03-25 DIAGNOSIS — K219 Gastro-esophageal reflux disease without esophagitis: Secondary | ICD-10-CM | POA: Diagnosis not present

## 2014-03-25 DIAGNOSIS — I10 Essential (primary) hypertension: Secondary | ICD-10-CM | POA: Diagnosis not present

## 2014-04-11 LAB — PSA: PSA: 1.55

## 2014-04-21 DIAGNOSIS — C61 Malignant neoplasm of prostate: Secondary | ICD-10-CM | POA: Diagnosis not present

## 2014-04-23 DIAGNOSIS — C61 Malignant neoplasm of prostate: Secondary | ICD-10-CM | POA: Diagnosis not present

## 2014-05-01 ENCOUNTER — Ambulatory Visit (INDEPENDENT_AMBULATORY_CARE_PROVIDER_SITE_OTHER): Payer: Medicare Other | Admitting: Family Medicine

## 2014-05-01 ENCOUNTER — Encounter: Payer: Self-pay | Admitting: Family Medicine

## 2014-05-01 VITALS — BP 132/68 | HR 76 | Temp 98.2°F | Ht 65.0 in | Wt 168.4 lb

## 2014-05-01 DIAGNOSIS — IMO0002 Reserved for concepts with insufficient information to code with codable children: Secondary | ICD-10-CM

## 2014-05-01 DIAGNOSIS — E1129 Type 2 diabetes mellitus with other diabetic kidney complication: Secondary | ICD-10-CM | POA: Diagnosis not present

## 2014-05-01 DIAGNOSIS — Z23 Encounter for immunization: Secondary | ICD-10-CM

## 2014-05-01 DIAGNOSIS — B0229 Other postherpetic nervous system involvement: Secondary | ICD-10-CM | POA: Diagnosis not present

## 2014-05-01 DIAGNOSIS — E7800 Pure hypercholesterolemia, unspecified: Secondary | ICD-10-CM

## 2014-05-01 DIAGNOSIS — E1165 Type 2 diabetes mellitus with hyperglycemia: Secondary | ICD-10-CM | POA: Diagnosis not present

## 2014-05-01 MED ORDER — LIDOCAINE 5 % EX PTCH: 1.0000 | MEDICATED_PATCH | CUTANEOUS | Status: DC

## 2014-05-01 MED ORDER — GABAPENTIN 600 MG PO TABS: 600.0000 mg | ORAL_TABLET | Freq: Two times a day (BID) | ORAL | Status: DC

## 2014-05-01 NOTE — Progress Notes (Signed)
   Subjective:    Patient ID: [name] [name], male    DOB: [birth year], 80 y.o.   MRN: [id number]  HPI  HPI HYPERTENSION  Blood pressure range-not checking  Chest pain- no      Dyspnea- no Lightheadedness- no   Edema- no Other side effects - no   Medication compliance: good Low salt diet- yes  DIABETES Blood Sugar ranges- < 103 Polyuria- no New Visual problems- no Hypoglycemic symptoms- no Other side effects-no Medication compliance - good Last eye exam- 01/2014 Foot exam- today  HYPERLIPIDEMIA  Medication compliance- good  RUQ pain- no  Muscle aches- no Other side effects-no  Urology--- [facility]   Review of Systems  Constitutional: Negative for diaphoresis, appetite change, fatigue and unexpected weight change.  Eyes: Negative for pain, redness and visual disturbance.  Respiratory: Negative for cough, chest tightness, shortness of breath and wheezing.   Cardiovascular: Negative for chest pain, palpitations and leg swelling.  Endocrine: Negative for cold intolerance, heat intolerance, polydipsia, polyphagia and polyuria.  Genitourinary: Negative for dysuria, frequency and difficulty urinating.  Neurological: Negative for dizziness, light-headedness, numbness and headaches.       Objective:   Physical Exam  BP 132/68 mmHg  Pulse 76  Temp(Src) 98.2 F (36.8 C) (Oral)  Ht 5\' 5"  (1.651 m)  Wt 168 lb 6.4 oz (76.386 kg)  BMI 28.02 kg/m2  SpO2 97% General appearance: alert, cooperative, appears stated age and no distress Throat: lips, mucosa, and tongue normal; teeth and gums normal Neck: no adenopathy, no carotid bruit, no JVD, supple, symmetrical, trachea midline and thyroid not enlarged, symmetric, no tenderness/mass/nodules Lungs: clear to auscultation bilaterally Heart: S1, S2 normal Abdomen: soft, non-tender; bowel sounds normal; no masses,  no organomegaly Extremities: extremities normal, atraumatic, no cyanosis or edema  Sensory exam of the foot is normal,  tested with the monofilament. Good pulses, no lesions or ulcers, good peripheral pulses.       Assessment & Plan:  1. Post herpetic neuralgia  - lidocaine (LIDODERM) 5 %; Place 1 patch onto the skin daily. Remove & Discard patch within 12 hours or as directed by MD  Dispense: 30 patch; Refill: 0 - gabapentin (NEURONTIN) 600 MG tablet; Take 1 tablet (600 mg total) by mouth 2 (two) times daily.  Dispense: 60 tablet; Refill: 5  2. Need for prophylactic vaccination against Streptococcus pneumoniae (pneumococcus)  - Pneumococcal conjugate vaccine 13-valent  3. Type II diabetes mellitus with renal manifestations, uncontrolled con't metformin  4. Pure hypercholesterolemia Cont zocor Recheck lipids 3 months

## 2014-05-01 NOTE — Progress Notes (Signed)
Pre visit review using our clinic review tool, if applicable. No additional management support is needed unless otherwise documented below in the visit note. 

## 2014-05-01 NOTE — Assessment & Plan Note (Signed)
con't metformin Will get labs done last month by prevous pcp

## 2014-05-01 NOTE — Patient Instructions (Signed)
Postherpetic Neuralgia Postherpetic neuralgia (PHN) is nerve pain that occurs after a shingles infection. Shingles is a painful rash that appears on one side of the body, usually on your trunk or face. Shingles is caused by the varicella-zoster virus. This is the same virus that causes chickenpox. In people who have had chickenpox, the virus can resurface years later and cause shingles. You may have PHN if you continue to have pain for 3 months after your shingles rash has gone away. PHN appears in the same area where you had the shingles rash. For most people, PHN goes away within 1 year.  Getting a vaccination for shingles can prevent PHN. This vaccine is recommended for people older than 50. It may prevent shingles and may also lower your risk of PHN if you do get shingles. CAUSES PHN is caused by damage to your nerves from the varicella-zoster virus. This damage makes your nerves overly sensitive.  RISK FACTORS Aging is the biggest risk factor for developing PHN. Most people who get PHN are older than 60. Other risk factors include:  Having very bad pain before your shingles rash starts.  Having a very bad rash.  Having shingles in the nerve that supplies your face and eye (trigeminal nerve). SIGNS AND SYMPTOMS Pain is the main symptom of PHN. The pain is often very bad and may be described as stabbing, burning, or feeling like an electric shock. The pain may come and go or may be there all the time. Pain may be triggered by light touches on the skin or changes in temperature. You may have itching along with the pain. DIAGNOSIS  Your health care provider may diagnose PHN based on your symptoms and your history of shingles. Lab studies and other diagnostic tests are usually not needed. TREATMENT  There is no cure for PHN. Treatment for PHN will focus on pain relief. Over-the-counter pain relievers do not usually relieve PHN pain. You may need to work with a pain specialist. Treatment may  include:  Antidepressant medicines to help with pain and improve sleep.  Antiseizure medicines to relieve nerve pain.  Strong pain relievers (opioids).  A numbing patch worn on the skin (lidocaine patch). HOME CARE INSTRUCTIONS It may take a long time to recover from PHN. Work closely with your health care provider, and have a good support system at home.   Take all medicines as directed by your health care provider.  Wear loose, comfortable clothing.  Cover sensitive areas with a dressing to reduce friction from clothing rubbing on the area.  If cold does not make your pain worse, try applying a cool compress or cooling gel pack to the area.  Talk to your health care provider if you feel depressed or desperate. Living with long-term pain can be depressing. SEEK MEDICAL CARE IF:  Your medicine is not helping.  You are struggling to manage your pain at home. Document Released: 06/18/2002 Document Revised: 08/12/2013 Document Reviewed: 03/19/2013 [employer] Patient Information 2015 [employer]. This information is not intended to replace advice given to you by your health care provider. Make sure you discuss any questions you have with your health care provider.  

## 2014-05-01 NOTE — Assessment & Plan Note (Signed)
Check labs  con't zocor 

## 2014-05-08 DIAGNOSIS — Z96659 Presence of unspecified artificial knee joint: Secondary | ICD-10-CM | POA: Diagnosis not present

## 2014-05-08 DIAGNOSIS — Z471 Aftercare following joint replacement surgery: Secondary | ICD-10-CM | POA: Diagnosis not present

## 2014-05-20 ENCOUNTER — Telehealth: Payer: Self-pay | Admitting: Family Medicine

## 2014-05-20 MED ORDER — OMEPRAZOLE 20 MG PO CPDR: 20.0000 mg | DELAYED_RELEASE_CAPSULE | Freq: Every day | ORAL | Status: DC

## 2014-05-20 NOTE — Telephone Encounter (Signed)
Caller name: Camari Relation to pt: self Call back Ali Chukson: [facility] on [address]  Reason for call:   Requesting a refill of omeprazole

## 2014-05-20 NOTE — Telephone Encounter (Signed)
Rx has been faxed.    KP 

## 2014-05-27 DIAGNOSIS — H2513 Age-related nuclear cataract, bilateral: Secondary | ICD-10-CM | POA: Diagnosis not present

## 2014-05-27 DIAGNOSIS — H4011X1 Primary open-angle glaucoma, mild stage: Secondary | ICD-10-CM | POA: Diagnosis not present

## 2014-06-12 ENCOUNTER — Telehealth: Payer: Self-pay | Admitting: Family Medicine

## 2014-06-12 DIAGNOSIS — E119 Type 2 diabetes mellitus without complications: Secondary | ICD-10-CM

## 2014-06-12 DIAGNOSIS — E118 Type 2 diabetes mellitus with unspecified complications: Secondary | ICD-10-CM

## 2014-06-12 DIAGNOSIS — I1310 Hypertensive heart and chronic kidney disease without heart failure, with stage 1 through stage 4 chronic kidney disease, or unspecified chronic kidney disease: Secondary | ICD-10-CM

## 2014-06-12 MED ORDER — FUROSEMIDE 20 MG PO TABS
20.0000 mg | ORAL_TABLET | ORAL | Status: DC
Start: 2014-06-12 — End: 2015-06-03

## 2014-06-12 MED ORDER — SIMVASTATIN 40 MG PO TABS: 40.0000 mg | ORAL_TABLET | Freq: Every day | ORAL | Status: DC

## 2014-06-12 MED ORDER — RAMIPRIL 2.5 MG PO CAPS: 2.5000 mg | ORAL_CAPSULE | Freq: Every day | ORAL | Status: DC

## 2014-06-12 NOTE — Telephone Encounter (Signed)
Rx faxed.    KP 

## 2014-06-12 NOTE — Telephone Encounter (Signed)
Caller name: Tavaras Relation to pt: self Call back number: [phone] Pharmacy: [facility] on [address]  Reason for call:   Patient is requesting a refill of ramipril, simvastatin, and furosemide

## 2014-07-07 ENCOUNTER — Other Ambulatory Visit: Payer: Self-pay

## 2014-07-07 MED ORDER — METFORMIN HCL 500 MG PO TABS: 500.0000 mg | ORAL_TABLET | Freq: Two times a day (BID) | ORAL | Status: DC

## 2014-07-31 ENCOUNTER — Ambulatory Visit (INDEPENDENT_AMBULATORY_CARE_PROVIDER_SITE_OTHER): Payer: Medicare Other | Admitting: Family Medicine

## 2014-07-31 ENCOUNTER — Encounter: Payer: Self-pay | Admitting: Family Medicine

## 2014-07-31 VITALS — BP 140/80 | HR 80 | Temp 99.2°F | Wt 170.8 lb

## 2014-07-31 DIAGNOSIS — E7800 Pure hypercholesterolemia, unspecified: Secondary | ICD-10-CM

## 2014-07-31 DIAGNOSIS — B0229 Other postherpetic nervous system involvement: Secondary | ICD-10-CM

## 2014-07-31 DIAGNOSIS — E785 Hyperlipidemia, unspecified: Secondary | ICD-10-CM | POA: Diagnosis not present

## 2014-07-31 DIAGNOSIS — I10 Essential (primary) hypertension: Secondary | ICD-10-CM

## 2014-07-31 DIAGNOSIS — E1149 Type 2 diabetes mellitus with other diabetic neurological complication: Secondary | ICD-10-CM | POA: Diagnosis not present

## 2014-07-31 LAB — POCT URINALYSIS DIPSTICK
BILIRUBIN UA: NEGATIVE
GLUCOSE UA: NEGATIVE
Ketones, UA: NEGATIVE
LEUKOCYTES UA: NEGATIVE
Nitrite, UA: NEGATIVE
Protein, UA: NEGATIVE
RBC UA: NEGATIVE
Spec Grav, UA: 1.025
Urobilinogen, UA: 1
pH, UA: 6

## 2014-07-31 LAB — BASIC METABOLIC PANEL
BUN: 14 mg/dL (ref 6–23)
CALCIUM: 9.2 mg/dL (ref 8.4–10.5)
CO2: 29 mEq/L (ref 19–32)
Chloride: 104 mEq/L (ref 96–112)
Creatinine, Ser: 0.96 mg/dL (ref 0.40–1.50)
GFR: 97.45 mL/min (ref >60.00)
GLUCOSE: 68 mg/dL — AB (ref 70–99)
POTASSIUM: 4 meq/L (ref 3.5–5.1)
Sodium: 138 mEq/L (ref 135–145)

## 2014-07-31 LAB — HEPATIC FUNCTION PANEL
ALT: 10 U/L (ref 0–53)
AST: 19 U/L (ref 0–37)
Albumin: 4 g/dL (ref 3.5–5.2)
Alkaline Phosphatase: 75 U/L (ref 39–117)
Bilirubin, Direct: 0.1 mg/dL (ref 0.0–0.3)
Total Bilirubin: 0.8 mg/dL (ref 0.2–1.2)
Total Protein: 6.8 g/dL (ref 6.0–8.3)

## 2014-07-31 LAB — HEMOGLOBIN A1C: HEMOGLOBIN A1C: 6.5 % (ref 4.6–6.5)

## 2014-07-31 LAB — LIPID PANEL
CHOLESTEROL: 151 mg/dL (ref 0–200)
HDL: 49.8 mg/dL (ref >39.00)
LDL Cholesterol: 89 mg/dL (ref 0–99)
NonHDL: 101.2
Total CHOL/HDL Ratio: 3
Triglycerides: 59 mg/dL (ref 0.0–149.0)
VLDL: 11.8 mg/dL (ref 0.0–40.0)

## 2014-07-31 LAB — MICROALBUMIN / CREATININE URINE RATIO
Creatinine,U: 140.7 mg/dL
Microalb Creat Ratio: 0.6 mg/g (ref 0.0–30.0)
Microalb, Ur: 0.8 mg/dL (ref 0.0–1.9)

## 2014-07-31 MED ORDER — GABAPENTIN 600 MG PO TABS: 600.0000 mg | ORAL_TABLET | Freq: Three times a day (TID) | ORAL | Status: DC

## 2014-07-31 NOTE — Patient Instructions (Signed)
Diabetes and Standards of Medical Care Diabetes is complicated. You may find that your diabetes team includes a dietitian, nurse, diabetes educator, eye doctor, and more. To help everyone know what is going on and to help you get the care you deserve, the following schedule of care was developed to help keep you on track. Below are the tests, exams, vaccines, medicines, education, and plans you will need. HbA1c test This test shows how well you have controlled your glucose over the past 2-3 months. It is used to see if your diabetes management plan needs to be adjusted.   It is performed at least 2 times a year if you are meeting treatment goals.  It is performed 4 times a year if therapy has changed or if you are not meeting treatment goals. Blood pressure test  This test is performed at every routine medical visit. The goal is less than 140/90 mm Hg for most people, but 130/80 mm Hg in some cases. Ask your health care provider about your goal. Dental exam  Follow up with the dentist regularly. Eye exam  If you are diagnosed with type 1 diabetes as a child, get an exam upon reaching the age of 37 years or older and have had diabetes for 3-5 years. Yearly eye exams are recommended after that initial eye exam.  If you are diagnosed with type 1 diabetes as an adult, get an exam within 5 years of diagnosis and then yearly.  If you are diagnosed with type 2 diabetes, get an exam as soon as possible after the diagnosis and then yearly. Foot care exam  Visual foot exams are performed at every routine medical visit. The exams check for cuts, injuries, or other problems with the feet.  A comprehensive foot exam should be done yearly. This includes visual inspection as well as assessing foot pulses and testing for loss of sensation.  Check your feet nightly for cuts, injuries, or other problems with your feet. Tell your health care provider if anything is not healing. Kidney function test (urine  microalbumin)  This test is performed once a year.  Type 1 diabetes: The first test is performed 5 years after diagnosis.  Type 2 diabetes: The first test is performed at the time of diagnosis.  A serum creatinine and estimated glomerular filtration rate (eGFR) test is done once a year to assess the level of chronic kidney disease (CKD), if present. Lipid profile (cholesterol, HDL, LDL, triglycerides)  Performed every 5 years for most people.  The goal for LDL is less than 100 mg/dL. If you are at high risk, the goal is less than 70 mg/dL.  The goal for HDL is 40 mg/dL-50 mg/dL for men and 50 mg/dL-60 mg/dL for women. An HDL cholesterol of 60 mg/dL or higher gives some protection against heart disease.  The goal for triglycerides is less than 150 mg/dL. Influenza vaccine, pneumococcal vaccine, and hepatitis B vaccine  The influenza vaccine is recommended yearly.  It is recommended that people with diabetes who are over 24 years old get the pneumonia vaccine. In some cases, two separate shots may be given. Ask your health care provider if your pneumonia vaccination is up to date.  The hepatitis B vaccine is also recommended for adults with diabetes. Diabetes self-management education  Education is recommended at diagnosis and ongoing as needed. Treatment plan  Your treatment plan is reviewed at every medical visit. Document Released: 01/23/2009 Document Revised: 08/12/2013 Document Reviewed: 08/28/2012 [facility] Patient Information 2015 [place]. This information is not intended to replace advice given to you by your health care provider. Make sure you discuss any questions you have with your health care provider.  

## 2014-07-31 NOTE — Progress Notes (Signed)
Pre visit review using our clinic review tool, if applicable. No additional management support is needed unless otherwise documented below in the visit note. 

## 2014-07-31 NOTE — Assessment & Plan Note (Signed)
con't simvastatin Check labs  

## 2014-07-31 NOTE — Assessment & Plan Note (Signed)
Inc gabepentin to tid Pt will let us know if there is no relief

## 2014-07-31 NOTE — Progress Notes (Signed)
Patient ID: [name] [name], male    DOB: [birth year]  Age: 80 y.o. MRN: [id number]    Subjective:  Subjective HPI Evo Aderman presents for f/u dm, cholesterol and bp.  HPI HYPERTENSION  Blood pressure range---not checking regularly  Chest pain- no      Dyspnea- no Lightheadedness- no   Edema- no Other side effects - no   Medication compliance: good Low salt diet- yes  DIABETES  Blood Sugar ranges-80--210 Polyuria- no New Visual problems- no Hypoglycemic symptoms- no Other side effects-no Medication compliance - good Last eye exam- 07/2014 Foot exam- today  HYPERLIPIDEMIA  Medication compliance- good RUQ pain- no  Muscle aches- no Other side effects-no    Review of Systems  Constitutional: Negative for diaphoresis, appetite change, fatigue and unexpected weight change.  Eyes: Negative for pain, redness and visual disturbance.  Respiratory: Negative for cough, chest tightness, shortness of breath and wheezing.   Cardiovascular: Negative for chest pain, palpitations and leg swelling.  Endocrine: Negative for cold intolerance, heat intolerance, polydipsia, polyphagia and polyuria.  Genitourinary: Negative for dysuria, frequency and difficulty urinating.  Neurological: Positive for numbness. Negative for dizziness, light-headedness and headaches.  Psychiatric/Behavioral: Negative for confusion, dysphoric mood and decreased concentration. The patient is not nervous/anxious.    Marland Kitchen History Past Medical History  Diagnosis Date  . Hyperlipidemia     5 yrs  . Diverticulosis 2009  . Hypertension   . Diabetes mellitus 1955    type 2 - fasting 100-120  . GERD (gastroesophageal reflux disease)   . History of shingles   . Glaucoma     bilateral  . Osteoarthritis     "knees" (01/31/2014)  . Prostate cancer     "no treatments; it's going away by itself w/the pills I take"    He has past surgical history that includes Colonoscopy; Total knee arthroplasty (Right, 01/31/2014);  Tonsillectomy; Total knee arthroplasty (Right, 01/31/2014); and Joint replacement.   His family history includes Arthritis in his other; Cancer in his other; Diabetes in his other; Heart disease in his brother.He reports that he quit smoking about 19 years ago. His smoking use included Cigars. He has never used smokeless tobacco. He reports that he does not drink alcohol or use illicit drugs.  Current Outpatient Prescriptions on File Prior to Visit  Medication Sig Dispense Refill  . ACCU-CHEK AVIVA PLUS test strip   9  . aspirin 81 MG tablet Take 81 mg by mouth daily.    . finasteride (PROSCAR) 5 MG tablet Take 5 mg by mouth daily.    . furosemide (LASIX) 20 MG tablet Take 1 tablet (20 mg total) by mouth every other day. 30 tablet 5  . glipiZIDE (GLUCOTROL) 5 MG tablet Take 5 mg by mouth 2 (two) times daily.    Marland Kitchen latanoprost (XALATAN) 0.005 % ophthalmic solution Place 1 drop into both eyes at bedtime.    . metFORMIN (GLUCOPHAGE) 500 MG tablet Take 1 tablet (500 mg total) by mouth 2 (two) times daily with a meal. 60 tablet 2  . omeprazole (PRILOSEC) 20 MG capsule Take 1 capsule (20 mg total) by mouth daily. 30 capsule 11  . oxyCODONE-acetaminophen (ROXICET) 5-325 MG per tablet Take 1 tablet by mouth every 4 (four) hours as needed. 60 tablet 0  . ramipril (ALTACE) 2.5 MG capsule Take 1 capsule (2.5 mg total) by mouth daily. 30 capsule 5  . simvastatin (ZOCOR) 40 MG tablet Take 1 tablet (40 mg total) by mouth daily. 30 tablet 2  No current facility-administered medications on file prior to visit.     Objective:  Objective Physical Exam  Constitutional: He is oriented to person, place, and time. Vital signs are normal. He appears well-developed and well-nourished. He is sleeping.  HENT:  Head: Normocephalic and atraumatic.  Mouth/Throat: Oropharynx is clear and moist.  Eyes: EOM are normal. Pupils are equal, round, and reactive to light.  Neck: Normal range of motion. Neck supple. No  thyromegaly present.  Cardiovascular: Normal rate and regular rhythm.   No murmur heard. Pulmonary/Chest: Effort normal and breath sounds normal. No respiratory distress. He has no wheezes. He has no rales. He exhibits no tenderness.  Musculoskeletal: He exhibits no edema or tenderness.  Neurological: He is alert and oriented to person, place, and time.  Skin: Skin is warm and dry.  Psychiatric: He has a normal mood and affect. His behavior is normal. Judgment and thought content normal.  Sensory exam of the foot is normal, tested with the monofilament. Good pulses, no lesions or ulcers, good peripheral pulses.  BP 140/80 mmHg  Pulse 80  Temp(Src) 99.2 F (37.3 C) (Oral)  Wt 170 lb 12.8 oz (77.474 kg)  SpO2 98% Wt Readings from Last 3 Encounters:  07/31/14 170 lb 12.8 oz (77.474 kg)  05/01/14 168 lb 6.4 oz (76.386 kg)  01/31/14 173 lb (78.472 kg)     Lab Results  Component Value Date   WBC 7.7 02/02/2014   HGB 9.8* 02/02/2014   HCT 29.2* 02/02/2014   PLT 203 02/02/2014   GLUCOSE 68* 07/31/2014   CHOL 151 07/31/2014   TRIG 59.0 07/31/2014   HDL 49.80 07/31/2014   LDLCALC 89 07/31/2014   ALT 10 07/31/2014   AST 19 07/31/2014   NA 138 07/31/2014   K 4.0 07/31/2014   CL 104 07/31/2014   CREATININE 0.96 07/31/2014   BUN 14 07/31/2014   CO2 29 07/31/2014   TSH 1.89 08/17/2010   PSA 3.43 06/08/2011   INR 1.04 01/23/2014   HGBA1C 6.5 07/31/2014   MICROALBUR 0.8 07/31/2014    No results found.   Assessment & Plan:  Plan I have discontinued Mr. Carline's lidocaine. I have also changed his gabapentin. Additionally, I am having him maintain his glipiZIDE, finasteride, latanoprost, oxyCODONE-acetaminophen, aspirin, ACCU-CHEK AVIVA PLUS, omeprazole, ramipril, furosemide, simvastatin, and metFORMIN.  Meds ordered this encounter  Medications  . gabapentin (NEURONTIN) 600 MG tablet    Sig: Take 1 tablet (600 mg total) by mouth 3 (three) times daily.    Dispense:  90 tablet     Refill:  5    Problem List Items Addressed This Visit    PHN (postherpetic neuralgia)    Inc gabepentin to tid Pt will let us know if there is no relief        Relevant Medications   gabapentin (NEURONTIN) 600 MG tablet   Pure hypercholesterolemia    con't simvastatin Check labs       Other Visit Diagnoses    Post herpetic neuralgia    -  Primary    Relevant Medications    gabapentin (NEURONTIN) 600 MG tablet    DM (diabetes mellitus) type II controlled, neurological manifestation        Relevant Orders    Basic metabolic panel (Completed)    Hemoglobin A1c (Completed)    Microalbumin / creatinine urine ratio (Completed)    POCT urinalysis dipstick (Completed)    Essential hypertension        Relevant Orders  Hepatic function panel (Completed)    Microalbumin / creatinine urine ratio (Completed)    POCT urinalysis dipstick (Completed)    Hyperlipidemia LDL goal <70        Relevant Orders    Lipid panel (Completed)    POCT urinalysis dipstick (Completed)       Follow-up: Return in about 6 months (around 01/30/2015), or if symptoms worsen or fail to improve.  Garnet Koyanagi, DO

## 2014-08-07 DIAGNOSIS — Z96659 Presence of unspecified artificial knee joint: Secondary | ICD-10-CM | POA: Diagnosis not present

## 2014-08-07 DIAGNOSIS — Z09 Encounter for follow-up examination after completed treatment for conditions other than malignant neoplasm: Secondary | ICD-10-CM | POA: Diagnosis not present

## 2014-08-18 ENCOUNTER — Telehealth: Payer: Self-pay | Admitting: Family Medicine

## 2014-08-18 NOTE — Telephone Encounter (Signed)
Spoke with the company and them made aware if the patient would like the Rx he would have to request it from [place] and bring the paperwork to be sign. She verbalized understanding.      KP

## 2014-08-18 NOTE — Telephone Encounter (Signed)
[name] [name] w/[facility] Ph# [phone] Calling to follow up on RX for [employer]. States that it was faxed originally on 08/11/14 to [phone]. States she spoke with Kennyth Lose on 08/14/14 and refaxed to [phone]. Please call [name] [name] asap. Thank you.

## 2014-09-16 DIAGNOSIS — Z23 Encounter for immunization: Secondary | ICD-10-CM | POA: Diagnosis not present

## 2014-09-18 ENCOUNTER — Other Ambulatory Visit: Payer: Self-pay | Admitting: Family Medicine

## 2014-10-03 ENCOUNTER — Telehealth: Payer: Self-pay | Admitting: Family Medicine

## 2014-10-03 MED ORDER — SIMVASTATIN 40 MG PO TABS: 40.0000 mg | ORAL_TABLET | Freq: Every day | ORAL | Status: DC

## 2014-10-03 MED ORDER — GLIPIZIDE 5 MG PO TABS: 5.0000 mg | ORAL_TABLET | Freq: Two times a day (BID) | ORAL | Status: DC

## 2014-10-03 MED ORDER — ACCU-CHEK AVIVA PLUS VI STRP: ORAL_STRIP | Status: DC

## 2014-10-03 NOTE — Telephone Encounter (Signed)
Caller name: Apolinar Relation to pt: self Call back number:   [phone] Pharmacy:  [facility] on [address] Reason for call:   Requesting test strip and simvastatin,and glipizide refill

## 2014-10-03 NOTE — Telephone Encounter (Signed)
Rx faxed.    KP 

## 2014-10-21 DIAGNOSIS — H04123 Dry eye syndrome of bilateral lacrimal glands: Secondary | ICD-10-CM | POA: Diagnosis not present

## 2014-10-21 DIAGNOSIS — H4011X1 Primary open-angle glaucoma, mild stage: Secondary | ICD-10-CM | POA: Diagnosis not present

## 2014-10-23 DIAGNOSIS — C61 Malignant neoplasm of prostate: Secondary | ICD-10-CM | POA: Diagnosis not present

## 2014-11-13 DIAGNOSIS — C61 Malignant neoplasm of prostate: Secondary | ICD-10-CM | POA: Diagnosis not present

## 2014-11-25 ENCOUNTER — Other Ambulatory Visit: Payer: Self-pay

## 2014-11-25 MED ORDER — RAMIPRIL 2.5 MG PO CAPS: 2.5000 mg | ORAL_CAPSULE | Freq: Every day | ORAL | Status: DC

## 2014-12-02 ENCOUNTER — Telehealth: Payer: Self-pay | Admitting: Family Medicine

## 2014-12-02 NOTE — Telephone Encounter (Signed)
Patient aware and verbalized understanding.     KP

## 2014-12-02 NOTE — Telephone Encounter (Signed)
°  Relation to pt: self  Call back number: home [phone] or mobile [phone] Pharmacy:  Reason for call:  Patient is having a tooth extract and dentist office would like to know if patient should d/c aspirin and patient states dentist inquired if patient is on a blood thinner.

## 2014-12-02 NOTE — Telephone Encounter (Signed)
Ok to stop aspirin before procedure and start immediately after He is on no other blood thinners

## 2014-12-02 NOTE — Telephone Encounter (Signed)
Please advise      KP 

## 2014-12-15 DIAGNOSIS — Z23 Encounter for immunization: Secondary | ICD-10-CM | POA: Diagnosis not present

## 2014-12-28 ENCOUNTER — Other Ambulatory Visit: Payer: Self-pay | Admitting: Family Medicine

## 2015-01-07 ENCOUNTER — Other Ambulatory Visit: Payer: Self-pay | Admitting: Family Medicine

## 2015-01-13 DIAGNOSIS — Z09 Encounter for follow-up examination after completed treatment for conditions other than malignant neoplasm: Secondary | ICD-10-CM | POA: Diagnosis not present

## 2015-01-26 ENCOUNTER — Other Ambulatory Visit: Payer: Self-pay | Admitting: Family Medicine

## 2015-02-03 ENCOUNTER — Encounter: Payer: Self-pay | Admitting: Family Medicine

## 2015-02-03 ENCOUNTER — Ambulatory Visit (INDEPENDENT_AMBULATORY_CARE_PROVIDER_SITE_OTHER): Payer: Medicare Other | Admitting: Family Medicine

## 2015-02-03 VITALS — BP 132/78 | HR 63 | Temp 98.8°F | Wt 176.2 lb

## 2015-02-03 DIAGNOSIS — R319 Hematuria, unspecified: Secondary | ICD-10-CM

## 2015-02-03 DIAGNOSIS — R8299 Other abnormal findings in urine: Secondary | ICD-10-CM | POA: Diagnosis not present

## 2015-02-03 DIAGNOSIS — E7800 Pure hypercholesterolemia, unspecified: Secondary | ICD-10-CM

## 2015-02-03 DIAGNOSIS — E1121 Type 2 diabetes mellitus with diabetic nephropathy: Secondary | ICD-10-CM

## 2015-02-03 DIAGNOSIS — I10 Essential (primary) hypertension: Secondary | ICD-10-CM

## 2015-02-03 DIAGNOSIS — E1165 Type 2 diabetes mellitus with hyperglycemia: Secondary | ICD-10-CM

## 2015-02-03 DIAGNOSIS — E785 Hyperlipidemia, unspecified: Secondary | ICD-10-CM | POA: Diagnosis not present

## 2015-02-03 DIAGNOSIS — E1151 Type 2 diabetes mellitus with diabetic peripheral angiopathy without gangrene: Secondary | ICD-10-CM

## 2015-02-03 DIAGNOSIS — IMO0002 Reserved for concepts with insufficient information to code with codable children: Secondary | ICD-10-CM

## 2015-02-03 DIAGNOSIS — R8290 Unspecified abnormal findings in urine: Secondary | ICD-10-CM

## 2015-02-03 LAB — CBC WITH DIFFERENTIAL/PLATELET
BASOS PCT: 0.4 % (ref 0.0–3.0)
Basophils Absolute: 0 10*3/uL (ref 0.0–0.1)
Eosinophils Absolute: 0.1 10*3/uL (ref 0.0–0.7)
Eosinophils Relative: 1.8 % (ref 0.0–5.0)
HCT: 36.7 % — ABNORMAL LOW (ref 39.0–52.0)
HEMOGLOBIN: 11.9 g/dL — AB (ref 13.0–17.0)
LYMPHS ABS: 1.2 10*3/uL (ref 0.7–4.0)
LYMPHS PCT: 30.1 % (ref 12.0–46.0)
MCHC: 32.5 g/dL (ref 30.0–36.0)
MCV: 95.6 fl (ref 78.0–100.0)
Monocytes Absolute: 0.4 10*3/uL (ref 0.1–1.0)
Monocytes Relative: 9.7 % (ref 3.0–12.0)
Neutro Abs: 2.4 10*3/uL (ref 1.4–7.7)
Neutrophils Relative %: 58 % (ref 43.0–77.0)
Platelets: 276 10*3/uL (ref 150.0–400.0)
RBC: 3.84 Mil/uL — ABNORMAL LOW (ref 4.22–5.81)
RDW: 14.3 % (ref 11.5–15.5)
WBC: 4.1 10*3/uL (ref 4.0–10.5)

## 2015-02-03 LAB — POCT URINALYSIS DIPSTICK
Bilirubin, UA: NEGATIVE
GLUCOSE UA: NEGATIVE
Ketones, UA: NEGATIVE
LEUKOCYTES UA: NEGATIVE
Nitrite, UA: NEGATIVE
Spec Grav, UA: 1.025
UROBILINOGEN UA: 0.2
pH, UA: 6

## 2015-02-03 LAB — COMPREHENSIVE METABOLIC PANEL
ALT: 13 U/L (ref 0–53)
AST: 14 U/L (ref 0–37)
Albumin: 4 g/dL (ref 3.5–5.2)
Alkaline Phosphatase: 69 U/L (ref 39–117)
BUN: 14 mg/dL (ref 6–23)
CALCIUM: 9.4 mg/dL (ref 8.4–10.5)
CHLORIDE: 103 meq/L (ref 96–112)
CO2: 31 meq/L (ref 19–32)
CREATININE: 0.92 mg/dL (ref 0.40–1.50)
GFR: 102.22 mL/min (ref >60.00)
GLUCOSE: 170 mg/dL — AB (ref 70–99)
Potassium: 4.3 mEq/L (ref 3.5–5.1)
SODIUM: 138 meq/L (ref 135–145)
Total Bilirubin: 0.7 mg/dL (ref 0.2–1.2)
Total Protein: 6.6 g/dL (ref 6.0–8.3)

## 2015-02-03 LAB — HEMOGLOBIN A1C: Hgb A1c MFr Bld: 10.6 % — ABNORMAL HIGH (ref 4.6–6.5)

## 2015-02-03 LAB — MICROALBUMIN / CREATININE URINE RATIO
Creatinine,U: 163.2 mg/dL
Microalb Creat Ratio: 1.3 mg/g (ref 0.0–30.0)
Microalb, Ur: 2.1 mg/dL — ABNORMAL HIGH (ref 0.0–1.9)

## 2015-02-03 LAB — LIPID PANEL
CHOLESTEROL: 142 mg/dL (ref 0–200)
HDL: 42.5 mg/dL (ref >39.00)
LDL Cholesterol: 85 mg/dL (ref 0–99)
NonHDL: 99.95
TRIGLYCERIDES: 77 mg/dL (ref 0.0–149.0)
Total CHOL/HDL Ratio: 3
VLDL: 15.4 mg/dL (ref 0.0–40.0)

## 2015-02-03 NOTE — Assessment & Plan Note (Signed)
con't ramipril  con't proscar and lasix rto 6 months

## 2015-02-03 NOTE — Assessment & Plan Note (Signed)
Check labs Cont glipizide and metformin con't acc checks

## 2015-02-03 NOTE — Progress Notes (Signed)
Pre visit review using our clinic review tool, if applicable. No additional management support is needed unless otherwise documented below in the visit note. 

## 2015-02-03 NOTE — Assessment & Plan Note (Signed)
con't zocor Check labs 

## 2015-02-03 NOTE — Progress Notes (Signed)
Patient ID: [name] [name], male    DOB: 1935-01-19  Age: 80 y.o. MRN: [id number]    Subjective:  Subjective HPI [name] [name] presents for f/u dm, htn and hyperlipidemia.  [name] [name] is also c/o post herpetic neuralgia ----neurontin tid is helping.   HYPERTENSION  Blood pressure range---not checking  Chest pain- no      Dyspnea- no Lightheadedness- no   Edema- no Other side effects - no   Medication compliance: good Low salt diet- yes  DIABETES  Blood Sugar ranges-120-160  Polyuria- no New Visual problems- no Hypoglycemic symptoms- no Other side effects-no Medication compliance - good Last eye exam- 10/2014 Foot exam- today  HYPERLIPIDEMIA  Medication compliance- good RUQ pain- no  Muscle aches- no Other side effects-no   Review of Systems  Constitutional: Negative for diaphoresis, appetite change, fatigue and unexpected weight change.  HENT: Positive for hearing loss. Negative for congestion.   Eyes: Negative for pain, redness and visual disturbance.  Respiratory: Negative for cough, chest tightness, shortness of breath and wheezing.   Cardiovascular: Negative for chest pain, palpitations and leg swelling.  Endocrine: Negative for cold intolerance, heat intolerance, polydipsia, polyphagia and polyuria.  Genitourinary: Negative for dysuria, frequency and difficulty urinating.  Neurological: Negative for dizziness, light-headedness, numbness and headaches.  Psychiatric/Behavioral: Negative for behavioral problems, dysphoric mood and decreased concentration. The patient is not nervous/anxious.     History Past Medical History  Diagnosis Date  . Hyperlipidemia     5 yrs  . Diverticulosis 2009  . Hypertension   . Diabetes mellitus 1955    type 2 - fasting 100-120  . GERD (gastroesophageal reflux disease)   . History of shingles   . Glaucoma     bilateral  . Osteoarthritis     "knees" (01/31/2014)  . Prostate cancer ([place])     "no treatments; it's going away by itself w/the  pills I take"    [name] [name] has past surgical history that includes Colonoscopy; Total knee arthroplasty (Right, 01/31/2014); Tonsillectomy; Total knee arthroplasty (Right, 01/31/2014); and Joint replacement.   His family history includes Arthritis in his other; Cancer in his other; Diabetes in his other; Heart disease in his brother.[name] [name] reports that [name] [name] quit smoking about 19 years ago. His smoking use included Cigars. [name] [name] has never used smokeless tobacco. [name] [name] reports that [name] [name] does not drink alcohol or use illicit drugs.  Current Outpatient Prescriptions on File Prior to Visit  Medication Sig Dispense Refill  . ACCU-CHEK AVIVA PLUS test strip Check your blood sugar twice daily. E11.9 100 each 9  . aspirin 81 MG tablet Take 81 mg by mouth daily.    . finasteride (PROSCAR) 5 MG tablet Take 5 mg by mouth daily.    . furosemide (LASIX) 20 MG tablet Take 1 tablet (20 mg total) by mouth every other day. 30 tablet 5  . gabapentin (NEURONTIN) 600 MG tablet take 1 tablet by mouth three times a day 90 tablet 5  . glipiZIDE (GLUCOTROL) 5 MG tablet take 1 tablet by mouth twice a day 60 tablet 2  . latanoprost (XALATAN) 0.005 % ophthalmic solution Place 1 drop into both eyes at bedtime.    . metFORMIN (GLUCOPHAGE) 500 MG tablet take 1 tablet by mouth twice a day with meals 60 tablet 5  . omeprazole (PRILOSEC) 20 MG capsule Take 1 capsule (20 mg total) by mouth daily. 30 capsule 11  . oxyCODONE-acetaminophen (ROXICET) 5-325 MG per tablet Take 1 tablet by mouth every 4 (four) hours as needed.  60 tablet 0  . ramipril (ALTACE) 2.5 MG capsule Take 1 capsule (2.5 mg total) by mouth daily. 30 capsule 5  . simvastatin (ZOCOR) 40 MG tablet take 1 tablet by mouth once daily 30 tablet 2   No current facility-administered medications on file prior to visit.     Objective:  Objective Physical Exam  Constitutional: [name] [name] is oriented to person, place, and time. Vital signs are normal. [name] [name] appears well-developed and well-nourished.  [name] [name] is sleeping.  HENT:  Head: Normocephalic and atraumatic.  Mouth/Throat: Oropharynx is clear and moist.  Eyes: EOM are normal. Pupils are equal, round, and reactive to light.  Neck: Normal range of motion. Neck supple. No thyromegaly present.  Cardiovascular: Normal rate and regular rhythm.   No murmur heard. Pulmonary/Chest: Effort normal and breath sounds normal. No respiratory distress. [name] [name] has no wheezes. [name] [name] has no rales. [name] [name] exhibits no tenderness.  Musculoskeletal: [name] [name] exhibits no edema or tenderness.  Neurological: [name] [name] is alert and oriented to person, place, and time.  Skin: Skin is warm and dry.  Psychiatric: [name] [name] has a normal mood and affect. His behavior is normal. Judgment and thought content normal.  Nursing note and vitals reviewed.  Sensory exam of the foot is normal, tested with the monofilament. Good pulses, no lesions or ulcers, good peripheral pulses.  BP 132/78 mmHg  Pulse 63  Temp(Src) 98.8 F (37.1 C) (Oral)  Wt 176 lb 3.2 oz (79.924 kg)  SpO2 98% Wt Readings from Last 3 Encounters:  02/03/15 176 lb 3.2 oz (79.924 kg)  07/31/14 170 lb 12.8 oz (77.474 kg)  05/01/14 168 lb 6.4 oz (76.386 kg)     Lab Results  Component Value Date   WBC 4.1 02/03/2015   HGB 11.9* 02/03/2015   HCT 36.7* 02/03/2015   PLT 276.0 02/03/2015   GLUCOSE 170* 02/03/2015   CHOL 142 02/03/2015   TRIG 77.0 02/03/2015   HDL 42.50 02/03/2015   LDLCALC 85 02/03/2015   ALT 13 02/03/2015   AST 14 02/03/2015   NA 138 02/03/2015   K 4.3 02/03/2015   CL 103 02/03/2015   CREATININE 0.92 02/03/2015   BUN 14 02/03/2015   CO2 31 02/03/2015   TSH 1.89 08/17/2010   PSA 3.43 06/08/2011   INR 1.04 01/23/2014   HGBA1C 10.6* 02/03/2015   MICROALBUR 2.1* 02/03/2015    No results found.   Assessment & Plan:  Plan I am having [name] [name] maintain his finasteride, latanoprost, oxyCODONE-acetaminophen, aspirin, omeprazole, furosemide, metFORMIN, ACCU-CHEK AVIVA PLUS, ramipril, glipiZIDE,  simvastatin, and gabapentin.  No orders of the defined types were placed in this encounter.    Problem List Items Addressed This Visit    Type II diabetes mellitus with renal manifestations, uncontrolled (HCC)    Check labs Cont glipizide and metformin con't acc checks       Pure hypercholesterolemia    con't zocor Check labs      HTN, goal below 140/90    con't ramipril  con't proscar and lasix rto 6 months      Hematuria   Relevant Orders   Urine Culture    Other Visit Diagnoses    DM (diabetes mellitus) type II controlled peripheral vascular disorder ([place])    -  Primary    Relevant Orders    Hemoglobin A1c (Completed)    Microalbumin / creatinine urine ratio (Completed)    POCT urinalysis dipstick (Completed)    Hyperlipidemia        Relevant Orders  Comp Met (CMET) (Completed)    Lipid panel (Completed)    Microalbumin / creatinine urine ratio (Completed)    POCT urinalysis dipstick (Completed)    Essential hypertension        Relevant Orders    Comp Met (CMET) (Completed)    CBC with Differential/Platelet (Completed)    POCT urinalysis dipstick (Completed)    Urine abnormality        Relevant Orders    Urine Culture       Follow-up: Return in about 6 months (around 08/04/2015), or if symptoms worsen or fail to improve, for hypertension, hyperlipidemia, diabetes II, annual exam, fasting.  Garnet Koyanagi, DO

## 2015-02-03 NOTE — Patient Instructions (Signed)

## 2015-02-05 LAB — URINE CULTURE: Colony Count: 25000

## 2015-02-18 MED ORDER — METFORMIN HCL 1000 MG PO TABS: 1000.0000 mg | ORAL_TABLET | Freq: Two times a day (BID) | ORAL | Status: DC

## 2015-02-24 DIAGNOSIS — M25552 Pain in left hip: Secondary | ICD-10-CM | POA: Diagnosis not present

## 2015-02-26 ENCOUNTER — Other Ambulatory Visit: Payer: Medicare Other

## 2015-02-26 DIAGNOSIS — R809 Proteinuria, unspecified: Secondary | ICD-10-CM | POA: Diagnosis not present

## 2015-02-27 LAB — PROTEIN, URINE, 24 HOUR
Protein, 24H Urine: 60 mg/24 h (ref <150)
Protein, Urine: 5 mg/dL (ref 5–25)

## 2015-03-27 ENCOUNTER — Other Ambulatory Visit: Payer: Self-pay | Admitting: Family Medicine

## 2015-04-14 DIAGNOSIS — M5442 Lumbago with sciatica, left side: Secondary | ICD-10-CM | POA: Diagnosis not present

## 2015-04-16 DIAGNOSIS — M545 Low back pain: Secondary | ICD-10-CM | POA: Diagnosis not present

## 2015-04-21 ENCOUNTER — Other Ambulatory Visit: Payer: Self-pay | Admitting: Family Medicine

## 2015-04-23 DIAGNOSIS — E119 Type 2 diabetes mellitus without complications: Secondary | ICD-10-CM | POA: Diagnosis not present

## 2015-04-23 DIAGNOSIS — H401131 Primary open-angle glaucoma, bilateral, mild stage: Secondary | ICD-10-CM | POA: Diagnosis not present

## 2015-04-23 DIAGNOSIS — H00022 Hordeolum internum right lower eyelid: Secondary | ICD-10-CM | POA: Diagnosis not present

## 2015-04-28 DIAGNOSIS — M5416 Radiculopathy, lumbar region: Secondary | ICD-10-CM | POA: Diagnosis not present

## 2015-05-06 DIAGNOSIS — M4806 Spinal stenosis, lumbar region: Secondary | ICD-10-CM | POA: Diagnosis not present

## 2015-05-25 ENCOUNTER — Other Ambulatory Visit: Payer: Self-pay

## 2015-05-25 MED ORDER — RAMIPRIL 2.5 MG PO CAPS: 2.5000 mg | ORAL_CAPSULE | Freq: Every day | ORAL | Status: DC

## 2015-05-26 DIAGNOSIS — M4806 Spinal stenosis, lumbar region: Secondary | ICD-10-CM | POA: Diagnosis not present

## 2015-05-26 HISTORY — PX: FINE NEEDLE ASPIRATION: SHX406

## 2015-06-03 ENCOUNTER — Other Ambulatory Visit: Payer: Self-pay

## 2015-06-03 MED ORDER — METFORMIN HCL 1000 MG PO TABS: 1000.0000 mg | ORAL_TABLET | Freq: Two times a day (BID) | ORAL | Status: DC

## 2015-06-03 MED ORDER — OMEPRAZOLE 20 MG PO CPDR: 20.0000 mg | DELAYED_RELEASE_CAPSULE | Freq: Every day | ORAL | Status: DC

## 2015-06-03 MED ORDER — FUROSEMIDE 20 MG PO TABS: 20.0000 mg | ORAL_TABLET | ORAL | Status: DC

## 2015-06-05 DIAGNOSIS — H04123 Dry eye syndrome of bilateral lacrimal glands: Secondary | ICD-10-CM | POA: Diagnosis not present

## 2015-06-05 DIAGNOSIS — H2513 Age-related nuclear cataract, bilateral: Secondary | ICD-10-CM | POA: Diagnosis not present

## 2015-06-05 DIAGNOSIS — H401131 Primary open-angle glaucoma, bilateral, mild stage: Secondary | ICD-10-CM | POA: Diagnosis not present

## 2015-06-05 DIAGNOSIS — C61 Malignant neoplasm of prostate: Secondary | ICD-10-CM | POA: Diagnosis not present

## 2015-06-05 LAB — HM DIABETES EYE EXAM

## 2015-06-10 DIAGNOSIS — N138 Other obstructive and reflux uropathy: Secondary | ICD-10-CM | POA: Diagnosis not present

## 2015-06-10 DIAGNOSIS — R3129 Other microscopic hematuria: Secondary | ICD-10-CM | POA: Diagnosis not present

## 2015-06-10 DIAGNOSIS — C61 Malignant neoplasm of prostate: Secondary | ICD-10-CM | POA: Diagnosis not present

## 2015-06-10 DIAGNOSIS — Z0000 Encounter for general adult medical examination without abnormal findings: Secondary | ICD-10-CM | POA: Diagnosis not present

## 2015-06-10 DIAGNOSIS — N401 Enlarged prostate with lower urinary tract symptoms: Secondary | ICD-10-CM | POA: Diagnosis not present

## 2015-06-10 HISTORY — PX: FINE NEEDLE ASPIRATION: SHX406

## 2015-06-15 DIAGNOSIS — M4806 Spinal stenosis, lumbar region: Secondary | ICD-10-CM | POA: Diagnosis not present

## 2015-06-15 DIAGNOSIS — M1712 Unilateral primary osteoarthritis, left knee: Secondary | ICD-10-CM | POA: Diagnosis not present

## 2015-06-16 ENCOUNTER — Other Ambulatory Visit: Payer: Self-pay

## 2015-06-16 MED ORDER — SIMVASTATIN 40 MG PO TABS: 40.0000 mg | ORAL_TABLET | Freq: Every day | ORAL | Status: DC

## 2015-06-22 ENCOUNTER — Telehealth: Payer: Self-pay | Admitting: *Deleted

## 2015-06-22 ENCOUNTER — Encounter: Payer: Self-pay | Admitting: *Deleted

## 2015-06-22 NOTE — Telephone Encounter (Signed)
Pre-Visit Call completed with patient and chart updated.   Pre-Visit Info documented in Specialty Comments under [employer].    

## 2015-06-23 ENCOUNTER — Encounter: Payer: Self-pay | Admitting: Family Medicine

## 2015-06-23 ENCOUNTER — Ambulatory Visit (INDEPENDENT_AMBULATORY_CARE_PROVIDER_SITE_OTHER): Payer: Medicare Other | Admitting: Family Medicine

## 2015-06-23 VITALS — BP 122/68 | HR 94 | Temp 98.4°F | Ht 65.0 in | Wt 169.6 lb

## 2015-06-23 DIAGNOSIS — E785 Hyperlipidemia, unspecified: Secondary | ICD-10-CM

## 2015-06-23 DIAGNOSIS — I10 Essential (primary) hypertension: Secondary | ICD-10-CM | POA: Diagnosis not present

## 2015-06-23 DIAGNOSIS — Z0000 Encounter for general adult medical examination without abnormal findings: Secondary | ICD-10-CM | POA: Diagnosis not present

## 2015-06-23 DIAGNOSIS — E1139 Type 2 diabetes mellitus with other diabetic ophthalmic complication: Secondary | ICD-10-CM

## 2015-06-23 NOTE — Progress Notes (Signed)
Subjective:   [name] [name] is a 81 y.o. male who presents for Medicare Annual/Subsequent preventive examination.  HPI HYPERTENSION  Blood pressure range-not checking   Chest pain- no      Dyspnea- no Lightheadedness- no   Edema- no Other side effects - no   Medication compliance: good Low salt diet- yes  DIABETES  Blood Sugar ranges-105-135  Polyuria- no New Visual problems- no Hypoglycemic symptoms- no Other side effects-no Medication compliance - good Last eye exam- 3 weeks ago Foot exam- today  HYPERLIPIDEMIA  Medication compliance- good RUQ pain- no  Muscle aches- no Other side effects-no  Review of Systems:   Review of Systems  Constitutional: Negative for activity change, appetite change and fatigue.  HENT: Negative for hearing loss, congestion, tinnitus and ear discharge.   Eyes: Negative for visual disturbance (see optho q49m-- ) Respiratory: Negative for cough, chest tightness and shortness of breath.   Cardiovascular: Negative for chest pain, palpitations and leg swelling.  Gastrointestinal: Negative for abdominal pain, diarrhea, constipation and abdominal distention.  Genitourinary: Negative for urgency, frequency, decreased urine volume and difficulty urinating.  Musculoskeletal: Negative for back pain, arthralgias and gait problem.  Skin: Negative for color change, pallor and rash.  Neurological: Negative for dizziness, light-headedness, numbness and headaches.  Hematological: Negative for adenopathy. Does not bruise/bleed easily.  Psychiatric/Behavioral: Negative for suicidal ideas, confusion, sleep disturbance, self-injury, dysphoric mood, decreased concentration and agitation.  Pt is able to read and write and can do all ADLs No risk for falling No abuse/ violence in home           Objective:    Vitals: BP 122/68 mmHg  Pulse 94  Temp(Src) 98.4 F (36.9 C) (Oral)  Ht _0  (1.651 m)  Wt 169 lb 9.6 oz (76.93 kg)  BMI 28.22 kg/m2  SpO2  97% BP 122/68 mmHg  Pulse 94  Temp(Src) 98.4 F (36.9 C) (Oral)  Ht _1  (1.651 m)  Wt 169 lb 9.6 oz (76.93 kg)  BMI 28.22 kg/m2  SpO2 97% General appearance: alert, cooperative, appears stated age and no distress Head: Normocephalic, without obvious abnormality, atraumatic Eyes: negative findings: lids and lashes normal and pupils equal, round, reactive to light and accomodation Ears: normal TM's and external ear canals both ears Nose: Nares normal. Septum midline. Mucosa normal. No drainage or sinus tenderness. Throat: lips, mucosa, and tongue normal; teeth and gums normal Neck: no adenopathy, supple, symmetrical, trachea midline and thyroid not enlarged, symmetric, no tenderness/mass/nodules Back: symmetric, no curvature. ROM normal. No CVA tenderness. Lungs: clear to auscultation bilaterally Chest wall: no tenderness Heart: S1, S2 normal Abdomen: soft, non-tender; bowel sounds normal; no masses,  no organomegaly Male genitalia: deferred--urology Rectal: deferred -urology Extremities: extremities normal, atraumatic, no cyanosis or edema Pulses: 2+ and symmetric Skin: Skin color, texture, turgor normal. No rashes or lesions Lymph nodes: Cervical, supraclavicular, and axillary nodes normal. Neurologic: Alert and oriented X 3, normal strength and tone. Normal symmetric reflexes. Normal coordination and gait Psych-  No depression, no anxiety Tobacco History  Smoking status  . Former Smoker -- 2 years  . Types: Cigars  . Quit date: 04/12/1995  Smokeless tobacco  . Never Used     Counseling given: Not Answered   Past Medical History  Diagnosis Date  . Hyperlipidemia     5 yrs  . Diverticulosis 2009  . Hypertension   . Diabetes mellitus 1955    type 2 - fasting 100-120  . GERD (gastroesophageal reflux  disease)   . History of shingles   . Glaucoma     bilateral  . Osteoarthritis     "knees" (01/31/2014)  . Prostate cancer ([place])     "no treatments; it's going away  by itself w/the pills I take"   Past Surgical History  Procedure Laterality Date  . Colonoscopy    . Total knee arthroplasty Right 01/31/2014  . Tonsillectomy    . Total knee arthroplasty Right 01/31/2014    Procedure: RIGHT TOTAL KNEE ARTHROPLASTY;  Surgeon: Kerin Salen, MD;  Location: [facility];  Service: Orthopedics;  Laterality: Right;  . Joint replacement    . Fine needle aspiration Left 06/10/15    Knee  . Fine needle aspiration  05/26/15    Back   Family History  Problem Relation Age of Onset  . Heart disease Brother     questionable  . Arthritis Other   . Diabetes Other   . Cancer Other     prostate   History  Sexual Activity  . Sexual Activity: No    Outpatient Encounter Prescriptions as of 06/23/2015  Medication Sig  . ACCU-CHEK AVIVA PLUS test strip Check your blood sugar twice daily. E11.9  . aspirin 81 MG tablet Take 81 mg by mouth daily.  . finasteride (PROSCAR) 5 MG tablet Take 5 mg by mouth daily.  . furosemide (LASIX) 20 MG tablet Take 1 tablet (20 mg total) by mouth every other day.  . gabapentin (NEURONTIN) 600 MG tablet take 1 tablet by mouth three times a day  . glipiZIDE (GLUCOTROL) 5 MG tablet take 1 tablet by mouth twice a day  . latanoprost (XALATAN) 0.005 % ophthalmic solution Place 1 drop into both eyes at bedtime.  . metFORMIN (GLUCOPHAGE) 1000 MG tablet Take 1 tablet (1,000 mg total) by mouth 2 (two) times daily with a meal.  . omeprazole (PRILOSEC) 20 MG capsule Take 1 capsule (20 mg total) by mouth daily.  Marland Kitchen oxyCODONE-acetaminophen (ROXICET) 5-325 MG per tablet Take 1 tablet by mouth every 4 (four) hours as needed.  . ramipril (ALTACE) 2.5 MG capsule Take 1 capsule (2.5 mg total) by mouth daily.  . simvastatin (ZOCOR) 40 MG tablet Take 1 tablet (40 mg total) by mouth daily.   No facility-administered encounter medications on file as of 06/23/2015.    Activities of Daily Living In your present state of health, do you have any difficulty  performing the following activities: 06/23/2015 02/03/2015  Hearing? N N  Vision? N N  Difficulty concentrating or making decisions? N N  Walking or climbing stairs? Y N  Dressing or bathing? N N  Doing errands, shopping? N N    Patient Care Team: Rosalita Chessman, DO as PCP - General (Family Medicine) Festus Aloe, MD as Consulting Physician (Urology) Marylynn Pearson, MD as Consulting Physician (Ophthalmology) Frederik Pear, MD as Consulting Physician (Orthopedic Surgery) Leighton Parody, PA-C as Physician Assistant (Orthopedic Surgery) Normajean Glasgow, MD as Attending Physician (Physical Medicine and Rehabilitation)   Assessment:    cpe Exercise Activities and Dietary recommendations Current Exercise Habits: The patient does not participate in regular exercise at present, Exercise limited by: orthopedic condition(s)  Goals    None     Fall Risk Fall Risk  06/23/2015 05/01/2014  Falls in the past year? No No   Depression Screen PHQ 2/9 Scores 06/23/2015 05/01/2014  PHQ - 2 Score 0 0    Cognitive Testing mmse 30/30  Immunization History  Administered Date(s) Administered  .  Influenza Split 12/21/2011  . Influenza Whole 12/19/2007, 01/16/2009, 01/27/2010  . Influenza, High Dose Seasonal PF 12/15/2014  . Influenza-Unspecified 01/01/2014  . Pneumococcal Conjugate-13 05/01/2014, 09/16/2014  . Pneumococcal Polysaccharide-23 12/19/2007  . Td 10/16/2007   Screening Tests Health Maintenance  Topic Date Due  . ZOSTAVAX  02/03/2016 (Originally 09/07/1996)  . FOOT EXAM  07/31/2015  . HEMOGLOBIN A1C  08/04/2015  . INFLUENZA VACCINE  11/10/2015  . OPHTHALMOLOGY EXAM  06/01/2016  . TETANUS/TDAP  10/15/2017  . PNA vac Low Risk Adult  Completed      Plan:    During the course of the visit the patient was educated and counseled about the following appropriate screening and preventive services:   Vaccines to include Pneumoccal, Influenza, Hepatitis B, Td, Zostavax,  HCV  Electrocardiogram  Cardiovascular Disease  Colorectal cancer screening  Diabetes screening  Prostate Cancer Screening  Glaucoma screening  Nutrition counseling   Smoking cessation counseling  Patient Instructions (the written plan) was given to the patient.  1. Preventative health care See above  2. 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 ACCU-CHEK AVIVA PLUS test strip Check your blood sugar twice daily. E11.9 100 each 9  . aspirin 81 MG tablet Take 81 mg by mouth daily.    . finasteride (PROSCAR) 5 MG tablet Take 5 mg by mouth daily.    . furosemide (LASIX) 20 MG tablet Take 1 tablet (20 mg total) by mouth every other day. 30 tablet 5  . gabapentin (NEURONTIN) 600 MG tablet take 1 tablet by mouth three times a day 90 tablet 5  . glipiZIDE (GLUCOTROL) 5 MG tablet take 1 tablet by mouth twice a day 60 tablet 2  . latanoprost (XALATAN) 0.005 % ophthalmic solution Place 1 drop into both eyes at bedtime.    . metFORMIN (GLUCOPHAGE) 1000 MG tablet Take 1 tablet (1,000 mg total) by mouth 2 (two) times daily with a meal. 60 tablet 2  . omeprazole (PRILOSEC) 20 MG capsule Take 1 capsule (20 mg total) by mouth daily. 30 capsule 11  . oxyCODONE-acetaminophen (ROXICET) 5-325 MG per tablet Take 1 tablet by mouth every 4 (four) hours as needed. 60 tablet 0  . ramipril (ALTACE) 2.5 MG capsule Take 1 capsule (2.5 mg total) by mouth daily. 30 capsule 5  . simvastatin (ZOCOR) 40 MG tablet Take 1 tablet (40 mg total) by mouth daily. 90 tablet 1   No current facility-administered medications on file prior to visit.   - Hemoglobin A1c - POCT urinalysis dipstick - Microalbumin / creatinine urine ratio - Comp Met (CMET)  3. Hyperlipidemia LDL goal <70 Check labs con't zocor - Lipid panel - POCT  urinalysis dipstick - Microalbumin / creatinine urine ratio - Comp Met (CMET) - EKG 12-Lead  4. Essential hypertension stable - CBC with Differential/Platelet - POCT urinalysis dipstick - Microalbumin / creatinine urine ratio - Comp Met (CMET) - EKG 12-Lead  5. Routine history and physical examination of adult     Garnet Koyanagi, DO  06/23/2015

## 2015-06-23 NOTE — Patient Instructions (Signed)

## 2015-06-23 NOTE — Progress Notes (Signed)
Pre visit review using our clinic review tool, if applicable. No additional management support is needed unless otherwise documented below in the visit note. 

## 2015-06-26 DIAGNOSIS — M4806 Spinal stenosis, lumbar region: Secondary | ICD-10-CM | POA: Diagnosis not present

## 2015-06-28 ENCOUNTER — Other Ambulatory Visit: Payer: Self-pay | Admitting: Family Medicine

## 2015-06-29 NOTE — Telephone Encounter (Signed)
Medication filled to pharmacy as requested.   

## 2015-06-30 ENCOUNTER — Other Ambulatory Visit: Payer: Medicare Other

## 2015-06-30 LAB — COMPREHENSIVE METABOLIC PANEL
ALT: 11 U/L (ref 0–53)
AST: 11 U/L (ref 0–37)
Albumin: 4.1 g/dL (ref 3.5–5.2)
Alkaline Phosphatase: 57 U/L (ref 39–117)
BILIRUBIN TOTAL: 0.6 mg/dL (ref 0.2–1.2)
BUN: 14 mg/dL (ref 6–23)
CALCIUM: 9.2 mg/dL (ref 8.4–10.5)
CHLORIDE: 103 meq/L (ref 96–112)
CO2: 32 meq/L (ref 19–32)
Creatinine, Ser: 0.9 mg/dL (ref 0.40–1.50)
GFR: 104.74 mL/min (ref >60.00)
GLUCOSE: 124 mg/dL — AB (ref 70–99)
Potassium: 4 mEq/L (ref 3.5–5.1)
Sodium: 140 mEq/L (ref 135–145)
Total Protein: 6.5 g/dL (ref 6.0–8.3)

## 2015-06-30 LAB — POCT URINALYSIS DIPSTICK
BILIRUBIN UA: NEGATIVE
GLUCOSE UA: NEGATIVE
Ketones, UA: NEGATIVE
LEUKOCYTES UA: NEGATIVE
NITRITE UA: NEGATIVE
PH UA: 6
Protein, UA: NEGATIVE
RBC UA: NEGATIVE
Spec Grav, UA: >=1.03
UROBILINOGEN UA: 0.2

## 2015-06-30 LAB — CBC WITH DIFFERENTIAL/PLATELET
BASOS PCT: 0.5 % (ref 0.0–3.0)
Basophils Absolute: 0 10*3/uL (ref 0.0–0.1)
Eosinophils Absolute: 0.1 10*3/uL (ref 0.0–0.7)
Eosinophils Relative: 2.6 % (ref 0.0–5.0)
HCT: 35.7 % — ABNORMAL LOW (ref 39.0–52.0)
Hemoglobin: 11.8 g/dL — ABNORMAL LOW (ref 13.0–17.0)
LYMPHS ABS: 1.4 10*3/uL (ref 0.7–4.0)
Lymphocytes Relative: 30.3 % (ref 12.0–46.0)
MCHC: 33.2 g/dL (ref 30.0–36.0)
MCV: 96.3 fl (ref 78.0–100.0)
MONO ABS: 0.4 10*3/uL (ref 0.1–1.0)
MONOS PCT: 9 % (ref 3.0–12.0)
NEUTROS ABS: 2.7 10*3/uL (ref 1.4–7.7)
NEUTROS PCT: 57.6 % (ref 43.0–77.0)
Platelets: 295 10*3/uL (ref 150.0–400.0)
RBC: 3.7 Mil/uL — AB (ref 4.22–5.81)
RDW: 14.7 % (ref 11.5–15.5)
WBC: 4.8 10*3/uL (ref 4.0–10.5)

## 2015-06-30 LAB — LIPID PANEL
CHOL/HDL RATIO: 4
Cholesterol: 153 mg/dL (ref 0–200)
HDL: 40.9 mg/dL (ref >39.00)
LDL Cholesterol: 91 mg/dL (ref 0–99)
NONHDL: 111.81
Triglycerides: 103 mg/dL (ref 0.0–149.0)
VLDL: 20.6 mg/dL (ref 0.0–40.0)

## 2015-06-30 LAB — MICROALBUMIN / CREATININE URINE RATIO
CREATININE, U: 189.6 mg/dL
MICROALB UR: 0.9 mg/dL (ref 0.0–1.9)
MICROALB/CREAT RATIO: 0.5 mg/g (ref 0.0–30.0)

## 2015-06-30 LAB — HEMOGLOBIN A1C: HEMOGLOBIN A1C: 6.6 % — AB (ref 4.6–6.5)

## 2015-07-14 DIAGNOSIS — M4806 Spinal stenosis, lumbar region: Secondary | ICD-10-CM | POA: Diagnosis not present

## 2015-07-21 IMAGING — CR DG CHEST 2V
2 series · 2 of 2 positions shown · non-contrast
Comparison: None.

CLINICAL DATA: Preoperative evaluation for right knee replacement

EXAM:
CHEST  2 VIEW

[w chest pa]
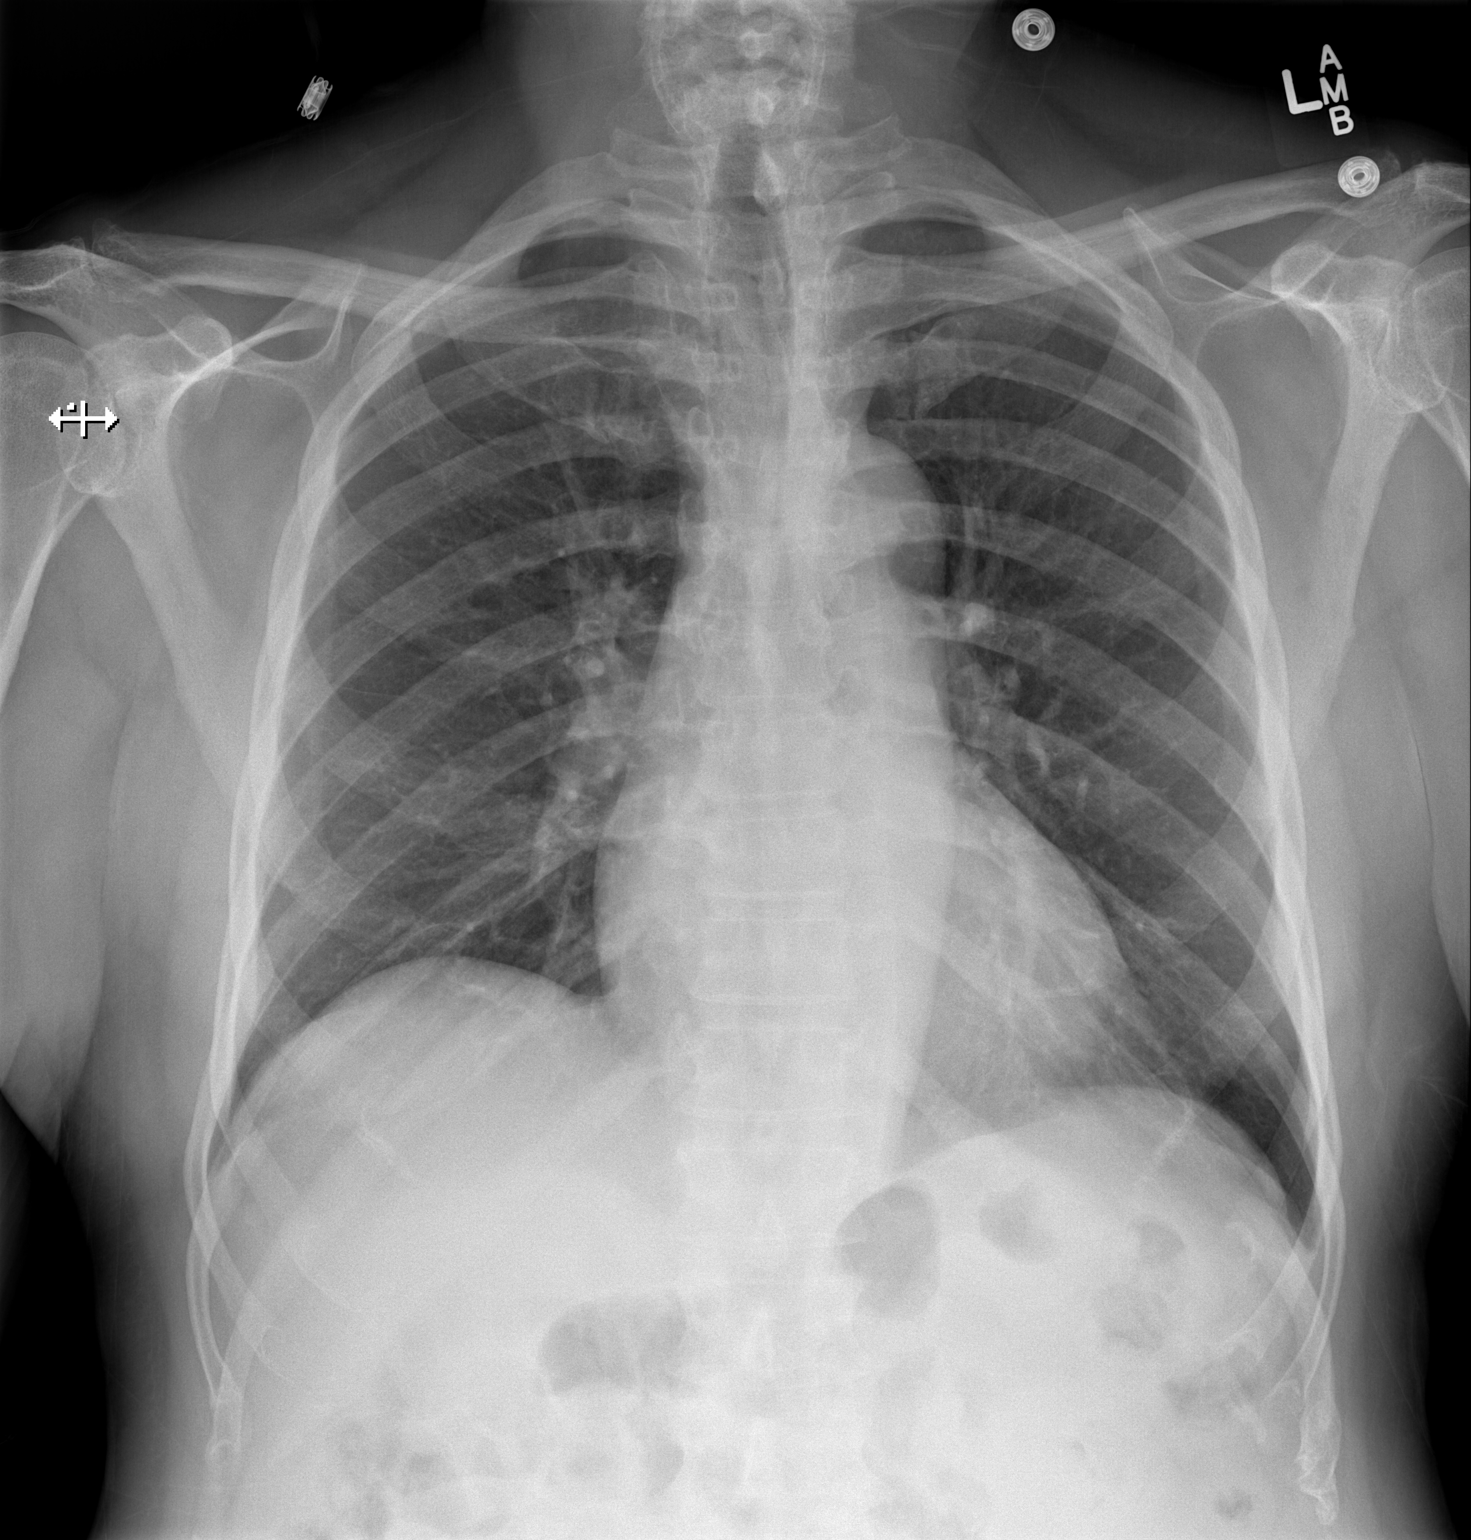

[w chest lat]
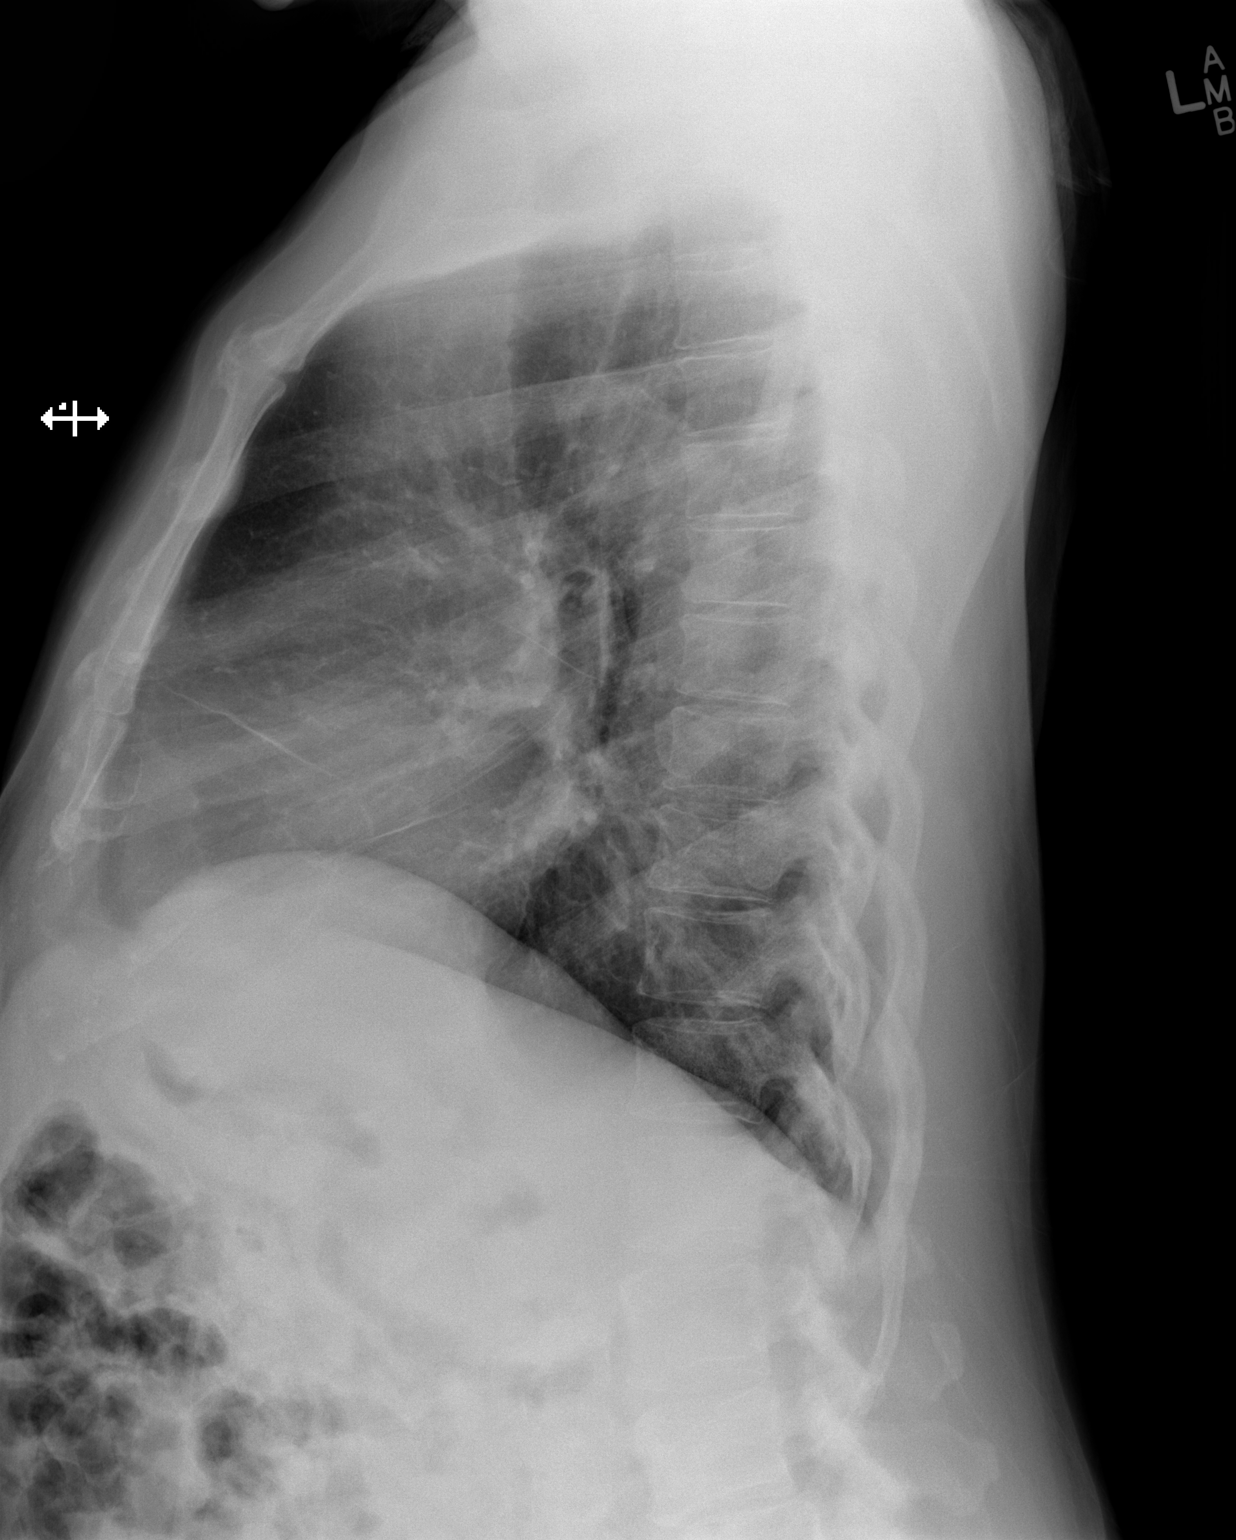

[2 of 2 positions shown; findings below may reference images not displayed]

FINDINGS: The heart size and mediastinal contours are within normal limits.
Both lungs are clear. The visualized skeletal structures are
unremarkable.
IMPRESSION: No active cardiopulmonary disease.

## 2015-07-27 ENCOUNTER — Other Ambulatory Visit: Payer: Self-pay | Admitting: Family Medicine

## 2015-08-06 ENCOUNTER — Encounter: Payer: Medicare Other | Admitting: Family Medicine

## 2015-08-07 DIAGNOSIS — M4806 Spinal stenosis, lumbar region: Secondary | ICD-10-CM | POA: Diagnosis not present

## 2015-08-18 DIAGNOSIS — G959 Disease of spinal cord, unspecified: Secondary | ICD-10-CM | POA: Diagnosis not present

## 2015-08-18 DIAGNOSIS — M4806 Spinal stenosis, lumbar region: Secondary | ICD-10-CM | POA: Diagnosis not present

## 2015-08-24 DIAGNOSIS — M542 Cervicalgia: Secondary | ICD-10-CM | POA: Diagnosis not present

## 2015-08-25 DIAGNOSIS — G959 Disease of spinal cord, unspecified: Secondary | ICD-10-CM | POA: Diagnosis not present

## 2015-08-26 ENCOUNTER — Other Ambulatory Visit: Payer: Self-pay | Admitting: Orthopedic Surgery

## 2015-08-28 ENCOUNTER — Telehealth: Payer: Self-pay | Admitting: Family Medicine

## 2015-08-28 NOTE — Telephone Encounter (Signed)
error 

## 2015-08-29 NOTE — Pre-Procedure Instructions (Signed)
[name] [name]  08/29/2015     Your procedure is scheduled on May 25.  Report to [facility] Admitting at 11 A.M.  Call this number if you have problems the morning of surgery:  [phone]   Remember:  Do not eat food or drink liquids after midnight.  Take these medicines the morning of surgery with A SIP OF WATER Finasteride, Gabapentin, Omeprazole, Tramadol (if needed),    STOP Asprin, CoQ 10 today   STOP/ Do not take Aspirin, Aleve, Naproxen, Advil, Ibuprofen, Motrin, Vitamins, Herbs, or Supplements starting today   How to Manage Your Diabetes Before and After Surgery  Why is it important to control my blood sugar before and after surgery? . Improving blood sugar levels before and after surgery helps healing and can limit problems. . A way of improving blood sugar control is eating a healthy diet by: o  Eating less sugar and carbohydrates o  Increasing activity/exercise o  Talking with your doctor about reaching your blood sugar goals . High blood sugars (greater than 180 mg/dL) can raise your risk of infections and slow your recovery, so you will need to focus on controlling your diabetes during the weeks before surgery. . Make sure that the doctor who takes care of your diabetes knows about your planned surgery including the date and location.  How do I manage my blood sugar before surgery? . Check your blood sugar at least 4 times a day, starting 2 days before surgery, to make sure that the level is not too high or low. o Check your blood sugar the morning of your surgery when you wake up and every 2 hours until you get to the Short Stay unit. . If your blood sugar is less than 70 mg/dL, you will need to treat for low blood sugar: o Do not take insulin. o Treat a low blood sugar (less than 70 mg/dL) with  cup of clear juice (cranberry or apple), 4 glucose tablets, OR glucose gel. o Recheck blood sugar in 15 minutes after treatment (to make sure it is greater  than 70 mg/dL). If your blood sugar is not greater than 70 mg/dL on recheck, call [phone] for further instructions. . Report your blood sugar to the short stay nurse when you get to Short Stay.  . If you are admitted to the hospital after surgery: o Your blood sugar will be checked by the staff and you will probably be given insulin after surgery (instead of oral diabetes medicines) to make sure you have good blood sugar levels. o The goal for blood sugar control after surgery is 80-180 mg/dL.   WHAT DO I DO ABOUT MY DIABETES MEDICATION?   Marland Kitchen Do not take oral diabetes medicines (pills) the morning of surgery.  . THE NIGHT BEFORE SURGERY, take ___________ units of ___________insulin.        Do not wear jewelry, make-up or nail polish.  Do not wear lotions, powders, or perfumes.  You may not wear deodorant.  Do not shave 48 hours prior to surgery.  Men may shave face and neck.  Do not bring valuables to the hospital.  [facility] is not responsible for any belongings or valuables.  Contacts, dentures or bridgework may not be worn into surgery.  Leave your suitcase in the car.  After surgery it may be brought to your room.  For patients admitted to the hospital, discharge time will be determined by your treatment team.  Patients discharged the  day of surgery will not be allowed to drive home.   [facility] - Preparing for Surgery  Before surgery, you can play an important role.  Because skin is not sterile, your skin needs to be as free of germs as possible.  You can reduce the number of germs on you skin by washing with CHG (chlorahexidine gluconate) soap before surgery.  CHG is an antiseptic cleaner which kills germs and bonds with the skin to continue killing germs even after washing.  Please DO NOT use if you have an allergy to CHG or antibacterial soaps.  If your skin becomes reddened/irritated stop using the CHG and inform your nurse when you arrive at Short Stay.  Do not  shave (including legs and underarms) for at least 48 hours prior to the first CHG shower.  You may shave your face.  Please follow these instructions carefully:   1.  Shower with CHG Soap the night before surgery and the morning of Surgery.  2.  If you choose to wash your hair, wash your hair first as usual with your normal shampoo.  3.  After you shampoo, rinse your hair and body thoroughly to remove the shampoo.  4.  Use CHG as you would any other liquid soap.  You can apply CHG directly to the skin and wash gently with scrungie or a clean washcloth.  5.  Apply the CHG Soap to your body ONLY FROM THE NECK DOWN.  Do not use on open wounds or open sores.  Avoid contact with your eyes, ears, mouth and genitals (private parts).  Wash genitals (private parts) with your normal soap.  6.  Wash thoroughly, paying special attention to the area where your surgery will be performed.  7.  Thoroughly rinse your body with warm water from the neck down.  8.  DO NOT shower/wash with your normal soap after using and rinsing off the CHG Soap.  9.  Pat yourself dry with a clean towel.            10.  Wear clean pajamas.            11.  Place clean sheets on your bed the night of your first shower and do not sleep with pets.  Day of Surgery  Do not apply any lotions the morning of surgery.  Please wear clean clothes to the hospital/surgery center.

## 2015-08-30 ENCOUNTER — Other Ambulatory Visit: Payer: Self-pay | Admitting: Family Medicine

## 2015-08-31 ENCOUNTER — Encounter (HOSPITAL_COMMUNITY): Payer: Self-pay

## 2015-08-31 ENCOUNTER — Encounter (HOSPITAL_COMMUNITY)
Admission: RE | Admit: 2015-08-31 | Discharge: 2015-08-31 | Disposition: A | Discharge status: Home / Self Care | Payer: Medicare Other | Source: Ambulatory Visit | Attending: Orthopedic Surgery | Admitting: Orthopedic Surgery

## 2015-08-31 ENCOUNTER — Ambulatory Visit (HOSPITAL_COMMUNITY)
Admission: RE | Admit: 2015-08-31 | Discharge: 2015-08-31 | Disposition: A | Discharge status: Home / Self Care | Payer: Medicare Other | Source: Ambulatory Visit | Attending: Orthopedic Surgery | Admitting: Orthopedic Surgery

## 2015-08-31 DIAGNOSIS — G959 Disease of spinal cord, unspecified: Secondary | ICD-10-CM | POA: Diagnosis not present

## 2015-08-31 DIAGNOSIS — Z87891 Personal history of nicotine dependence: Secondary | ICD-10-CM | POA: Diagnosis not present

## 2015-08-31 DIAGNOSIS — Z01818 Encounter for other preprocedural examination: Secondary | ICD-10-CM

## 2015-08-31 DIAGNOSIS — Z7982 Long term (current) use of aspirin: Secondary | ICD-10-CM | POA: Diagnosis not present

## 2015-08-31 DIAGNOSIS — E119 Type 2 diabetes mellitus without complications: Secondary | ICD-10-CM | POA: Diagnosis not present

## 2015-08-31 DIAGNOSIS — Z7984 Long term (current) use of oral hypoglycemic drugs: Secondary | ICD-10-CM | POA: Insufficient documentation

## 2015-08-31 DIAGNOSIS — Z79899 Other long term (current) drug therapy: Secondary | ICD-10-CM | POA: Diagnosis not present

## 2015-08-31 DIAGNOSIS — K219 Gastro-esophageal reflux disease without esophagitis: Secondary | ICD-10-CM | POA: Insufficient documentation

## 2015-08-31 DIAGNOSIS — I10 Essential (primary) hypertension: Secondary | ICD-10-CM | POA: Insufficient documentation

## 2015-08-31 DIAGNOSIS — H409 Unspecified glaucoma: Secondary | ICD-10-CM | POA: Insufficient documentation

## 2015-08-31 DIAGNOSIS — Z8546 Personal history of malignant neoplasm of prostate: Secondary | ICD-10-CM | POA: Insufficient documentation

## 2015-08-31 DIAGNOSIS — E785 Hyperlipidemia, unspecified: Secondary | ICD-10-CM | POA: Diagnosis not present

## 2015-08-31 DIAGNOSIS — Z01812 Encounter for preprocedural laboratory examination: Secondary | ICD-10-CM | POA: Insufficient documentation

## 2015-08-31 DIAGNOSIS — Z96651 Presence of right artificial knee joint: Secondary | ICD-10-CM | POA: Diagnosis not present

## 2015-08-31 LAB — CBC WITH DIFFERENTIAL/PLATELET
BASOS ABS: 0 10*3/uL (ref 0.0–0.1)
Basophils Relative: 1 %
EOS ABS: 0.1 10*3/uL (ref 0.0–0.7)
EOS PCT: 2 %
HCT: 35.2 % — ABNORMAL LOW (ref 39.0–52.0)
Hemoglobin: 11.3 g/dL — ABNORMAL LOW (ref 13.0–17.0)
LYMPHS PCT: 33 %
Lymphs Abs: 1.3 10*3/uL (ref 0.7–4.0)
MCH: 30.5 pg (ref 26.0–34.0)
MCHC: 32.1 g/dL (ref 30.0–36.0)
MCV: 95.1 fL (ref 78.0–100.0)
MONO ABS: 0.4 10*3/uL (ref 0.1–1.0)
Monocytes Relative: 10 %
Neutro Abs: 2.2 10*3/uL (ref 1.7–7.7)
Neutrophils Relative %: 54 %
Platelets: 315 10*3/uL (ref 150–400)
RBC: 3.7 MIL/uL — ABNORMAL LOW (ref 4.22–5.81)
RDW: 13.1 % (ref 11.5–15.5)
WBC: 4 10*3/uL (ref 4.0–10.5)

## 2015-08-31 LAB — COMPREHENSIVE METABOLIC PANEL
ALT: 12 U/L — AB (ref 17–63)
AST: 14 U/L — AB (ref 15–41)
Albumin: 4 g/dL (ref 3.5–5.0)
Alkaline Phosphatase: 62 U/L (ref 38–126)
Anion gap: 5 (ref 5–15)
BILIRUBIN TOTAL: 0.5 mg/dL (ref 0.3–1.2)
BUN: 13 mg/dL (ref 6–20)
CALCIUM: 9.4 mg/dL (ref 8.9–10.3)
CO2: 29 mmol/L (ref 22–32)
CREATININE: 0.99 mg/dL (ref 0.61–1.24)
Chloride: 106 mmol/L (ref 101–111)
GFR calc Af Amer: >60 mL/min (ref >60)
Glucose, Bld: 70 mg/dL (ref 65–99)
POTASSIUM: 4.1 mmol/L (ref 3.5–5.1)
Sodium: 140 mmol/L (ref 135–145)
TOTAL PROTEIN: 6.5 g/dL (ref 6.5–8.1)

## 2015-08-31 LAB — APTT: APTT: 29 s (ref 24–37)

## 2015-08-31 LAB — SURGICAL PCR SCREEN
MRSA, PCR: NEGATIVE
STAPHYLOCOCCUS AUREUS: NEGATIVE

## 2015-08-31 LAB — URINALYSIS, ROUTINE W REFLEX MICROSCOPIC
BILIRUBIN URINE: NEGATIVE
GLUCOSE, UA: NEGATIVE mg/dL
HGB URINE DIPSTICK: NEGATIVE
KETONES UR: NEGATIVE mg/dL
Leukocytes, UA: NEGATIVE
Nitrite: NEGATIVE
PH: 7 (ref 5.0–8.0)
Protein, ur: NEGATIVE mg/dL
Specific Gravity, Urine: 1.024 (ref 1.005–1.030)

## 2015-08-31 LAB — GLUCOSE, CAPILLARY: Glucose-Capillary: 81 mg/dL (ref 65–99)

## 2015-08-31 LAB — PROTIME-INR
INR: 0.95 (ref 0.00–1.49)
PROTHROMBIN TIME: 12.9 s (ref 11.6–15.2)

## 2015-08-31 NOTE — Telephone Encounter (Signed)
Refilled patients metformin #60 and 2rf

## 2015-08-31 NOTE — H&P (Signed)
PREOPERATIVE H&P  Chief Complaint: Deterioration in balance and fine motor skills  HPI: [name] [name] is a 81 y.o. male who presents with ongoing symptoms as reflected above. The patient also reports progressive weakness in his upper and lower extremities.  MRI reveals a C5-6 disc herniation, with obvious compression of the left hemicord  Patient has failed multiple forms of conservative care and continues to have pain (see office notes for additional details regarding the patient's full course of treatment)  Past Medical History  Diagnosis Date  . Hyperlipidemia     5 yrs  . Diverticulosis 2009  . Hypertension   . Diabetes mellitus 1955    type 2 - fasting 100-120  . GERD (gastroesophageal reflux disease)   . History of shingles   . Glaucoma     bilateral  . Osteoarthritis     "knees" (01/31/2014)  . Prostate cancer ([place])     "no treatments; it's going away by itself w/the pills I take"   Past Surgical History  Procedure Laterality Date  . Colonoscopy    . Total knee arthroplasty Right 01/31/2014  . Tonsillectomy    . Total knee arthroplasty Right 01/31/2014    Procedure: RIGHT TOTAL KNEE ARTHROPLASTY;  Surgeon: Kerin Salen, MD;  Location: [facility];  Service: Orthopedics;  Laterality: Right;  . Joint replacement    . Fine needle aspiration Left 06/10/15    Knee  . Fine needle aspiration  05/26/15    Back   Social History   Social History  . Marital Status: Widowed    Spouse Name: N/A  . Number of Children: N/A  . Years of Education: N/A   Occupational History  .      retired Biomedical scientist   .      Restaurant manager, fast food   Social History Main Topics  . Smoking status: Former Smoker -- 2 years    Types: Cigars    Quit date: 04/12/1995  . Smokeless tobacco: Never Used  . Alcohol Use: No  . Drug Use: No  . Sexual Activity: No   Other Topics Concern  . Not on file   Social History Narrative   Exercise-- housework   Family History  Problem Relation Age of Onset  .  Heart disease Brother     questionable  . Arthritis Other   . Diabetes Other   . Cancer Other     prostate   No Known Allergies Prior to Admission medications   Medication Sig Start Date End Date Taking? Authorizing Provider  ACCU-CHEK AVIVA PLUS test strip Check your blood sugar twice daily. E11.9 10/03/14  Yes Yvonne R Lowne Chase, DO  aspirin 81 MG tablet Take 81 mg by mouth daily.   Yes Historical Provider, MD  Coenzyme Q10 (CO Q 10 PO) Take 50 mg by mouth daily.   Yes Historical Provider, MD  finasteride (PROSCAR) 5 MG tablet Take 5 mg by mouth daily.   Yes Historical Provider, MD  furosemide (LASIX) 20 MG tablet Take 1 tablet (20 mg total) by mouth every other day. 06/03/15  Yes Alferd Apa Lowne Chase, DO  gabapentin (NEURONTIN) 600 MG tablet take 1 tablet by mouth three times a day Patient taking differently: take 1 tablet by mouth two times a day 07/28/15  Yes Alferd Apa Lowne Chase, DO  glipiZIDE (GLUCOTROL) 5 MG tablet take 1 tablet by mouth twice a day 06/29/15  Yes Yvonne R Lowne Chase, DO  latanoprost (XALATAN) 0.005 % ophthalmic  solution Place 1 drop into both eyes at bedtime.   Yes Historical Provider, MD  metFORMIN (GLUCOPHAGE) 1000 MG tablet Take 1 tablet (1,000 mg total) by mouth 2 (two) times daily with a meal. 06/03/15  Yes Yvonne R Lowne Chase, DO  omeprazole (PRILOSEC) 20 MG capsule Take 1 capsule (20 mg total) by mouth daily. 06/03/15  Yes Yvonne R Lowne Chase, DO  ramipril (ALTACE) 2.5 MG capsule Take 1 capsule (2.5 mg total) by mouth daily. 05/25/15  Yes Yvonne R Lowne Chase, DO  simvastatin (ZOCOR) 40 MG tablet Take 1 tablet (40 mg total) by mouth daily. 06/16/15  Yes Yvonne R Lowne Chase, DO  traMADol (ULTRAM) 50 MG tablet Take 50-100 mg by mouth every 8 (eight) hours as needed. 08/19/15  Yes Historical Provider, MD     All other systems have been reviewed and were otherwise negative with the exception of those mentioned in the HPI and as above.  Physical Exam: There were  no vitals filed for this visit.  General: Alert, no acute distress Cardiovascular: No pedal edema Respiratory: No cyanosis, no use of accessory musculature Skin: No lesions in the area of chief complaint Neurologic: Sensation intact distally Psychiatric: Patient is competent for consent with normal mood and affect Lymphatic: No axillary or cervical lymphadenopathy  MUSCULOSKELETAL:  Diffuse weakness noted throughout the patient's bilateral upper and lower extremities  Assessment/Plan: Cervical myelopathy   Plan for Procedure(s): ANTERIOR CERVICAL DECOMPRESSION FUSION, CERVICAL 5-6 WITH INSTRUMENTATION AND ALLOGRAFT   Sinclair Ship, MD 08/31/2015 9:47 AM

## 2015-08-31 NOTE — Progress Notes (Signed)
PCP- [facility] Cardiology- unsure of physician but notes from Dr. Johnsie Cancel and [facility]. Stress test- 01-20-09 ECHO 02-04-09 Fasting blood glucose 79-125  Denies chest pain and SOB

## 2015-09-01 LAB — HEMOGLOBIN A1C
Hgb A1c MFr Bld: 6.8 % — ABNORMAL HIGH (ref 4.8–5.6)
Mean Plasma Glucose: 148 mg/dL

## 2015-09-01 NOTE — Progress Notes (Signed)
Anesthesia Chart Review:  Pt is a 81 year old male scheduled for C5-6 ACDF on 09/03/2015 with Dr. Lynann Bologna.   PMH includes:  HTN, DM, hyperlipidemia, glaucoma, prostate cancer, GERD. Former smoker. BMI 27. S/p R TKA 01/31/14.   Medications include: ASA, lasix, glipizide, metformin, prilosec, ramipril, simvastatin.   Preoperative labs reviewed.  hgbA1c 6.8, glucose 70  Chest x-ray 08/31/15 reviewed. No active cardiopulmonary disease.   EKG 06/23/15: sinus rhythm.   Exercise stress test on 08/22/12 (correspondence 02/03/14 in media tab): Negative for ischemia. Hypertensive. Continue primary prevention. Normal exercise capacity.  Echo 06/22/12 (correspondence 02/03/14 in media tab): Mild concentric LVH with normal LV internal dimension wall motion. LVEF is 52% by M-mode measurement, but is at least 55% by 2-D imaging. Normal RV size and contractility. Evidence of grade 1 left ventricular diastolic dysfunction. Trace mitral and aortic regurgitation. Mild tricuspid regurgitation. Estimated RVSP 26 mmHg.  If no changes, I anticipate pt can proceed with surgery as scheduled.   Willeen Cass, FNP-BC [facility] Stay [facility]/Anesthesiology Phone: [phone] 09/01/2015 1:32 PM

## 2015-09-03 ENCOUNTER — Ambulatory Visit (HOSPITAL_COMMUNITY): Payer: Medicare Other | Admitting: Certified Registered Nurse Anesthetist

## 2015-09-03 ENCOUNTER — Ambulatory Visit (HOSPITAL_COMMUNITY): Payer: Medicare Other | Admitting: Emergency Medicine

## 2015-09-03 ENCOUNTER — Inpatient Hospital Stay (HOSPITAL_COMMUNITY)
Admission: RE | Admit: 2015-09-03 | Discharge: 2015-09-04 | DRG: 472 | Disposition: A | Discharge status: Home / Self Care | Payer: Medicare Other | Source: Ambulatory Visit | Attending: Orthopedic Surgery | Admitting: Orthopedic Surgery

## 2015-09-03 ENCOUNTER — Encounter (HOSPITAL_COMMUNITY)
Admission: RE | Disposition: A | Discharge status: Home / Self Care | Payer: Self-pay | Source: Ambulatory Visit | Attending: Orthopedic Surgery

## 2015-09-03 ENCOUNTER — Ambulatory Visit (HOSPITAL_COMMUNITY): Payer: Medicare Other

## 2015-09-03 ENCOUNTER — Encounter (HOSPITAL_COMMUNITY): Payer: Self-pay | Admitting: Certified Registered Nurse Anesthetist

## 2015-09-03 DIAGNOSIS — M4712 Other spondylosis with myelopathy, cervical region: Secondary | ICD-10-CM | POA: Diagnosis present

## 2015-09-03 DIAGNOSIS — Z8546 Personal history of malignant neoplasm of prostate: Secondary | ICD-10-CM | POA: Diagnosis not present

## 2015-09-03 DIAGNOSIS — I10 Essential (primary) hypertension: Secondary | ICD-10-CM | POA: Diagnosis present

## 2015-09-03 DIAGNOSIS — Z7984 Long term (current) use of oral hypoglycemic drugs: Secondary | ICD-10-CM

## 2015-09-03 DIAGNOSIS — Z87891 Personal history of nicotine dependence: Secondary | ICD-10-CM

## 2015-09-03 DIAGNOSIS — Z79899 Other long term (current) drug therapy: Secondary | ICD-10-CM

## 2015-09-03 DIAGNOSIS — G959 Disease of spinal cord, unspecified: Secondary | ICD-10-CM | POA: Diagnosis present

## 2015-09-03 DIAGNOSIS — Z981 Arthrodesis status: Secondary | ICD-10-CM | POA: Diagnosis not present

## 2015-09-03 DIAGNOSIS — Z833 Family history of diabetes mellitus: Secondary | ICD-10-CM

## 2015-09-03 DIAGNOSIS — Z8249 Family history of ischemic heart disease and other diseases of the circulatory system: Secondary | ICD-10-CM | POA: Diagnosis not present

## 2015-09-03 DIAGNOSIS — H409 Unspecified glaucoma: Secondary | ICD-10-CM | POA: Diagnosis present

## 2015-09-03 DIAGNOSIS — Z7982 Long term (current) use of aspirin: Secondary | ICD-10-CM

## 2015-09-03 DIAGNOSIS — G9589 Other specified diseases of spinal cord: Secondary | ICD-10-CM | POA: Diagnosis not present

## 2015-09-03 DIAGNOSIS — E119 Type 2 diabetes mellitus without complications: Secondary | ICD-10-CM | POA: Diagnosis present

## 2015-09-03 DIAGNOSIS — K219 Gastro-esophageal reflux disease without esophagitis: Secondary | ICD-10-CM | POA: Diagnosis present

## 2015-09-03 DIAGNOSIS — M50022 Cervical disc disorder at C5-C6 level with myelopathy: Secondary | ICD-10-CM | POA: Diagnosis not present

## 2015-09-03 DIAGNOSIS — E785 Hyperlipidemia, unspecified: Secondary | ICD-10-CM | POA: Diagnosis present

## 2015-09-03 DIAGNOSIS — G9520 Unspecified cord compression: Secondary | ICD-10-CM | POA: Diagnosis present

## 2015-09-03 DIAGNOSIS — M50222 Other cervical disc displacement at C5-C6 level: Secondary | ICD-10-CM | POA: Diagnosis not present

## 2015-09-03 DIAGNOSIS — Z96651 Presence of right artificial knee joint: Secondary | ICD-10-CM | POA: Diagnosis present

## 2015-09-03 DIAGNOSIS — Z419 Encounter for procedure for purposes other than remedying health state, unspecified: Secondary | ICD-10-CM

## 2015-09-03 HISTORY — PX: ANTERIOR CERVICAL DECOMP/DISCECTOMY FUSION: SHX1161

## 2015-09-03 LAB — GLUCOSE, CAPILLARY
GLUCOSE-CAPILLARY: 60 mg/dL — AB (ref 65–99)
GLUCOSE-CAPILLARY: 110 mg/dL — AB (ref 65–99)
GLUCOSE-CAPILLARY: 263 mg/dL — AB (ref 65–99)
Glucose-Capillary: 70 mg/dL (ref 65–99)
Glucose-Capillary: 69 mg/dL (ref 65–99)
Glucose-Capillary: 54 mg/dL — ABNORMAL LOW (ref 65–99)

## 2015-09-03 SURGERY — ANTERIOR CERVICAL DECOMPRESSION/DISCECTOMY FUSION 1 LEVEL
Anesthesia: General

## 2015-09-03 MED ORDER — RAMIPRIL 2.5 MG PO CAPS
2.5000 mg | ORAL_CAPSULE | Freq: Every day | ORAL | Status: DC
  Administered 2015-09-04: 2.5 mg via ORAL
  Filled 2015-09-03 (×2): qty 1

## 2015-09-03 MED ORDER — OXYCODONE-ACETAMINOPHEN 5-325 MG PO TABS
1.0000 | ORAL_TABLET | ORAL | Status: DC | PRN
  Administered 2015-09-03: 2 via ORAL
  Filled 2015-09-03: qty 2

## 2015-09-03 MED ORDER — ONDANSETRON HCL 4 MG/2ML IJ SOLN
INTRAMUSCULAR | Status: DC | PRN
Start: 2015-09-03 — End: 2015-09-03
  Administered 2015-09-03: 4 mg via INTRAVENOUS

## 2015-09-03 MED ORDER — FLEET ENEMA 7-19 GM/118ML RE ENEM: 1.0000 | ENEMA | Freq: Once | RECTAL | Status: DC | PRN

## 2015-09-03 MED ORDER — MENTHOL 3 MG MT LOZG: 1.0000 | LOZENGE | OROMUCOSAL | Status: DC | PRN

## 2015-09-03 MED ORDER — LACTATED RINGERS IV SOLN
INTRAVENOUS | Status: DC
  Administered 2015-09-03 (×3): via INTRAVENOUS

## 2015-09-03 MED ORDER — FUROSEMIDE 20 MG PO TABS
20.0000 mg | ORAL_TABLET | ORAL | Status: DC
  Filled 2015-09-03: qty 1

## 2015-09-03 MED ORDER — PHENOL 1.4 % MT LIQD: 1.0000 | OROMUCOSAL | Status: DC | PRN

## 2015-09-03 MED ORDER — PANTOPRAZOLE SODIUM 40 MG PO TBEC
40.0000 mg | DELAYED_RELEASE_TABLET | Freq: Every day | ORAL | Status: DC
  Administered 2015-09-04: 40 mg via ORAL
  Filled 2015-09-03: qty 1

## 2015-09-03 MED ORDER — DEXAMETHASONE SODIUM PHOSPHATE 10 MG/ML IJ SOLN
INTRAMUSCULAR | Status: AC
  Filled 2015-09-03: qty 1

## 2015-09-03 MED ORDER — CEFAZOLIN SODIUM-DEXTROSE 2-4 GM/100ML-% IV SOLN
2.0000 g | INTRAVENOUS | Status: AC
  Administered 2015-09-03: 2000 mg via INTRAVENOUS

## 2015-09-03 MED ORDER — FINASTERIDE 5 MG PO TABS
5.0000 mg | ORAL_TABLET | Freq: Every day | ORAL | Status: DC
  Administered 2015-09-04: 5 mg via ORAL
  Filled 2015-09-03: qty 1

## 2015-09-03 MED ORDER — LATANOPROST 0.005 % OP SOLN
1.0000 [drp] | Freq: Every day | OPHTHALMIC | Status: DC
  Administered 2015-09-03: 1 [drp] via OPHTHALMIC
  Filled 2015-09-03: qty 2.5

## 2015-09-03 MED ORDER — PHENYLEPHRINE HCL 10 MG/ML IJ SOLN
10.0000 mg | INTRAVENOUS | Status: DC | PRN
  Administered 2015-09-03: 60 ug/min via INTRAVENOUS

## 2015-09-03 MED ORDER — ALUM & MAG HYDROXIDE-SIMETH 200-200-20 MG/5ML PO SUSP: 30.0000 mL | Freq: Four times a day (QID) | ORAL | Status: DC | PRN

## 2015-09-03 MED ORDER — PROPOFOL 10 MG/ML IV BOLUS
INTRAVENOUS | Status: DC | PRN
  Administered 2015-09-03: 120 mg via INTRAVENOUS

## 2015-09-03 MED ORDER — BISACODYL 5 MG PO TBEC: 5.0000 mg | DELAYED_RELEASE_TABLET | Freq: Every day | ORAL | Status: DC | PRN

## 2015-09-03 MED ORDER — DIAZEPAM 5 MG PO TABS
5.0000 mg | ORAL_TABLET | Freq: Four times a day (QID) | ORAL | Status: DC | PRN
Start: 2015-09-03 — End: 2015-09-04

## 2015-09-03 MED ORDER — DOCUSATE SODIUM 100 MG PO CAPS
100.0000 mg | ORAL_CAPSULE | Freq: Two times a day (BID) | ORAL | Status: DC
  Administered 2015-09-03 – 2015-09-04 (×2): 100 mg via ORAL
  Filled 2015-09-03 (×2): qty 1

## 2015-09-03 MED ORDER — ONDANSETRON HCL 4 MG/2ML IJ SOLN: 4.0000 mg | INTRAMUSCULAR | Status: DC | PRN

## 2015-09-03 MED ORDER — BUPIVACAINE-EPINEPHRINE 0.25% -1:200000 IJ SOLN
INTRAMUSCULAR | Status: DC | PRN
  Administered 2015-09-03: 2 mL

## 2015-09-03 MED ORDER — SODIUM CHLORIDE 0.9 % IV SOLN
250.0000 mL | INTRAVENOUS | Status: DC
  Administered 2015-09-03: 250 mL via INTRAVENOUS

## 2015-09-03 MED ORDER — ONDANSETRON HCL 4 MG/2ML IJ SOLN
INTRAMUSCULAR | Status: AC
  Filled 2015-09-03: qty 2

## 2015-09-03 MED ORDER — BUPIVACAINE-EPINEPHRINE (PF) 0.25% -1:200000 IJ SOLN
INTRAMUSCULAR | Status: AC
  Filled 2015-09-03: qty 30

## 2015-09-03 MED ORDER — ONDANSETRON HCL 4 MG/2ML IJ SOLN
INTRAMUSCULAR | Status: AC
  Filled 2015-09-03: qty 4

## 2015-09-03 MED ORDER — CEFAZOLIN SODIUM 1-5 GM-% IV SOLN
1.0000 g | Freq: Three times a day (TID) | INTRAVENOUS | Status: AC
  Administered 2015-09-03 – 2015-09-04 (×2): 1 g via INTRAVENOUS
  Filled 2015-09-03 (×2): qty 50

## 2015-09-03 MED ORDER — HYDROMORPHONE HCL 1 MG/ML IJ SOLN: 0.2500 mg | INTRAMUSCULAR | Status: DC | PRN

## 2015-09-03 MED ORDER — GLIPIZIDE 5 MG PO TABS
5.0000 mg | ORAL_TABLET | Freq: Two times a day (BID) | ORAL | Status: DC
  Administered 2015-09-04: 5 mg via ORAL
  Filled 2015-09-03 (×2): qty 1

## 2015-09-03 MED ORDER — SODIUM CHLORIDE 0.9% FLUSH
3.0000 mL | Freq: Two times a day (BID) | INTRAVENOUS | Status: DC
  Administered 2015-09-03: 3 mL via INTRAVENOUS

## 2015-09-03 MED ORDER — DEXTROSE 50 % IV SOLN
INTRAVENOUS | Status: AC
  Administered 2015-09-03 (×2): 25 mL
  Filled 2015-09-03: qty 50

## 2015-09-03 MED ORDER — SIMVASTATIN 40 MG PO TABS
40.0000 mg | ORAL_TABLET | Freq: Every day | ORAL | Status: DC
  Administered 2015-09-04: 40 mg via ORAL
  Filled 2015-09-03: qty 1

## 2015-09-03 MED ORDER — DEXAMETHASONE SODIUM PHOSPHATE 4 MG/ML IJ SOLN
INTRAMUSCULAR | Status: DC | PRN
  Administered 2015-09-03: 10 mg via INTRAVENOUS

## 2015-09-03 MED ORDER — 0.9 % SODIUM CHLORIDE (POUR BTL) OPTIME
TOPICAL | Status: DC | PRN
  Administered 2015-09-03: 1000 mL

## 2015-09-03 MED ORDER — ZOLPIDEM TARTRATE 5 MG PO TABS: 5.0000 mg | ORAL_TABLET | Freq: Every evening | ORAL | Status: DC | PRN

## 2015-09-03 MED ORDER — CEFAZOLIN SODIUM-DEXTROSE 2-4 GM/100ML-% IV SOLN
INTRAVENOUS | Status: AC
  Filled 2015-09-03: qty 100

## 2015-09-03 MED ORDER — THROMBIN 20000 UNITS EX KIT
PACK | CUTANEOUS | Status: DC | PRN
  Administered 2015-09-03: 20000 [IU] via TOPICAL

## 2015-09-03 MED ORDER — GABAPENTIN 600 MG PO TABS
600.0000 mg | ORAL_TABLET | Freq: Two times a day (BID) | ORAL | Status: DC
  Administered 2015-09-03 – 2015-09-04 (×2): 600 mg via ORAL
  Filled 2015-09-03 (×2): qty 1

## 2015-09-03 MED ORDER — ROCURONIUM BROMIDE 50 MG/5ML IV SOLN
INTRAVENOUS | Status: AC
  Filled 2015-09-03: qty 1

## 2015-09-03 MED ORDER — MINERAL OIL LIGHT 100 % EX OIL
TOPICAL_OIL | CUTANEOUS | Status: AC
  Filled 2015-09-03: qty 25

## 2015-09-03 MED ORDER — THROMBIN 20000 UNITS EX SOLR
CUTANEOUS | Status: AC
  Filled 2015-09-03: qty 20000

## 2015-09-03 MED ORDER — MORPHINE SULFATE (PF) 2 MG/ML IV SOLN
1.0000 mg | INTRAVENOUS | Status: DC | PRN
Start: 2015-09-03 — End: 2015-09-04

## 2015-09-03 MED ORDER — HYDROCODONE-ACETAMINOPHEN 5-325 MG PO TABS: 1.0000 | ORAL_TABLET | ORAL | Status: DC | PRN

## 2015-09-03 MED ORDER — SODIUM CHLORIDE 0.9% FLUSH: 3.0000 mL | INTRAVENOUS | Status: DC | PRN

## 2015-09-03 MED ORDER — LIDOCAINE HCL (CARDIAC) 20 MG/ML IV SOLN
INTRAVENOUS | Status: DC | PRN
  Administered 2015-09-03: 60 mg via INTRAVENOUS

## 2015-09-03 MED ORDER — SUGAMMADEX SODIUM 200 MG/2ML IV SOLN
INTRAVENOUS | Status: AC
  Filled 2015-09-03: qty 2

## 2015-09-03 MED ORDER — ACETAMINOPHEN 650 MG RE SUPP: 650.0000 mg | RECTAL | Status: DC | PRN

## 2015-09-03 MED ORDER — SENNOSIDES-DOCUSATE SODIUM 8.6-50 MG PO TABS: 1.0000 | ORAL_TABLET | Freq: Every evening | ORAL | Status: DC | PRN

## 2015-09-03 MED ORDER — POVIDONE-IODINE 7.5 % EX SOLN
Freq: Once | CUTANEOUS | Status: DC
  Filled 2015-09-03: qty 118

## 2015-09-03 MED ORDER — ROCURONIUM BROMIDE 100 MG/10ML IV SOLN
INTRAVENOUS | Status: DC | PRN
  Administered 2015-09-03: 50 mg via INTRAVENOUS

## 2015-09-03 MED ORDER — ACETAMINOPHEN 325 MG PO TABS: 650.0000 mg | ORAL_TABLET | ORAL | Status: DC | PRN

## 2015-09-03 MED ORDER — METFORMIN HCL 500 MG PO TABS
1000.0000 mg | ORAL_TABLET | Freq: Two times a day (BID) | ORAL | Status: DC
  Administered 2015-09-04: 1000 mg via ORAL
  Filled 2015-09-03 (×3): qty 2

## 2015-09-03 MED ORDER — FENTANYL CITRATE (PF) 250 MCG/5ML IJ SOLN
INTRAMUSCULAR | Status: AC
  Filled 2015-09-03: qty 5

## 2015-09-03 MED ORDER — SUGAMMADEX SODIUM 200 MG/2ML IV SOLN
INTRAVENOUS | Status: DC | PRN
  Administered 2015-09-03: 150 mg via INTRAVENOUS

## 2015-09-03 MED ORDER — FENTANYL CITRATE (PF) 250 MCG/5ML IJ SOLN
INTRAMUSCULAR | Status: DC | PRN
  Administered 2015-09-03: 100 ug via INTRAVENOUS
  Administered 2015-09-03 (×3): 50 ug via INTRAVENOUS

## 2015-09-03 SURGICAL SUPPLY — 72 items
BENZOIN TINCTURE PRP APPL 2/3 (GAUZE/BANDAGES/DRESSINGS) ×3 IMPLANT
BIT DRILL NEURO 2X3.1 SFT TUCH (MISCELLANEOUS) ×1 IMPLANT
BIT DRILL SRG 14X2.2XFLT CHK (BIT) ×1 IMPLANT
BIT DRL SRG 14X2.2XFLT CHK (BIT) ×1
BLADE SURG 15 STRL LF DISP TIS (BLADE) ×1 IMPLANT
BLADE SURG 15 STRL SS (BLADE) ×2
BLADE SURG ROTATE 9660 (MISCELLANEOUS) ×3 IMPLANT
BUR MATCHSTICK NEURO 3.0 LAGG (BURR) IMPLANT
CARTRIDGE OIL MAESTRO DRILL (MISCELLANEOUS) ×1 IMPLANT
CLOSURE WOUND 1/2 X4 (GAUZE/BANDAGES/DRESSINGS) ×1
COLLAR CERV LO CONTOUR FIRM DE (SOFTGOODS) ×3 IMPLANT
CORDS BIPOLAR (ELECTRODE) ×3 IMPLANT
COVER SURGICAL LIGHT HANDLE (MISCELLANEOUS) ×3 IMPLANT
CRADLE DONUT ADULT HEAD (MISCELLANEOUS) ×3 IMPLANT
DIFFUSER DRILL AIR PNEUMATIC (MISCELLANEOUS) ×3 IMPLANT
DRAIN JACKSON RD 7FR 3/32 (WOUND CARE) IMPLANT
DRAPE C-ARM 42X72 X-RAY (DRAPES) ×3 IMPLANT
DRAPE POUCH INSTRU U-SHP 10X18 (DRAPES) ×3 IMPLANT
DRAPE SURG 17X23 STRL (DRAPES) ×9 IMPLANT
DRILL BIT SKYLINE 14MM (BIT) ×2
DRILL NEURO 2X3.1 SOFT TOUCH (MISCELLANEOUS) ×3
DURAPREP 26ML APPLICATOR (WOUND CARE) ×3 IMPLANT
ELECT COATED BLADE 2.86 ST (ELECTRODE) ×3 IMPLANT
ELECT REM PT RETURN 9FT ADLT (ELECTROSURGICAL) ×3
ELECTRODE REM PT RTRN 9FT ADLT (ELECTROSURGICAL) ×1 IMPLANT
EVACUATOR SILICONE 100CC (DRAIN) IMPLANT
GAUZE SPONGE 4X4 12PLY STRL (GAUZE/BANDAGES/DRESSINGS) ×3 IMPLANT
GAUZE SPONGE 4X4 16PLY XRAY LF (GAUZE/BANDAGES/DRESSINGS) ×3 IMPLANT
GLOVE BIO SURGEON STRL SZ7 (GLOVE) ×3 IMPLANT
GLOVE BIO SURGEON STRL SZ8 (GLOVE) ×3 IMPLANT
GLOVE BIOGEL PI IND STRL 7.0 (GLOVE) ×2 IMPLANT
GLOVE BIOGEL PI IND STRL 8 (GLOVE) ×1 IMPLANT
GLOVE BIOGEL PI INDICATOR 7.0 (GLOVE) ×4
GLOVE BIOGEL PI INDICATOR 8 (GLOVE) ×2
GOWN STRL REUS W/ TWL LRG LVL3 (GOWN DISPOSABLE) ×1 IMPLANT
GOWN STRL REUS W/ TWL XL LVL3 (GOWN DISPOSABLE) ×1 IMPLANT
GOWN STRL REUS W/TWL LRG LVL3 (GOWN DISPOSABLE) ×2
GOWN STRL REUS W/TWL XL LVL3 (GOWN DISPOSABLE) ×2
INTERLOCK LRDTC CRVCL VBR 7MM (Bone Implant) ×1 IMPLANT
IV CATH 14GX2 1/4 (CATHETERS) ×3 IMPLANT
KIT BASIN OR (CUSTOM PROCEDURE TRAY) ×3 IMPLANT
KIT ROOM TURNOVER OR (KITS) ×3 IMPLANT
LORDOTIC CERVICAL VBR 7MM SM (Bone Implant) ×3 IMPLANT
MANIFOLD NEPTUNE II (INSTRUMENTS) ×3 IMPLANT
NEEDLE 27GAX1X1/2 (NEEDLE) ×3 IMPLANT
NEEDLE SPNL 20GX3.5 QUINCKE YW (NEEDLE) ×3 IMPLANT
NS IRRIG 1000ML POUR BTL (IV SOLUTION) ×3 IMPLANT
OIL CARTRIDGE MAESTRO DRILL (MISCELLANEOUS) ×3
PACK ORTHO CERVICAL (CUSTOM PROCEDURE TRAY) ×3 IMPLANT
PAD ARMBOARD 7.5X6 YLW CONV (MISCELLANEOUS) ×6 IMPLANT
PATTIES SURGICAL .5 X.5 (GAUZE/BANDAGES/DRESSINGS) IMPLANT
PATTIES SURGICAL .5 X1 (DISPOSABLE) IMPLANT
PIN DISTRACTION 14 (PIN) ×6 IMPLANT
PLATE SKYLINE 12MM (Plate) ×3 IMPLANT
PUTTY BONE DBX 2.5 MIS (Bone Implant) ×3 IMPLANT
SCREW SKYLINE VAR OS 14MM (Screw) ×12 IMPLANT
SPONGE INTESTINAL PEANUT (DISPOSABLE) ×3 IMPLANT
SPONGE SURGIFOAM ABS GEL 100 (HEMOSTASIS) IMPLANT
STRIP CLOSURE SKIN 1/2X4 (GAUZE/BANDAGES/DRESSINGS) ×2 IMPLANT
SURGIFLO W/THROMBIN 8M KIT (HEMOSTASIS) IMPLANT
SUT MNCRL AB 4-0 PS2 18 (SUTURE) IMPLANT
SUT SILK 4 0 (SUTURE)
SUT SILK 4-0 18XBRD TIE 12 (SUTURE) IMPLANT
SUT VIC AB 2-0 CT2 18 VCP726D (SUTURE) ×3 IMPLANT
SYR BULB IRRIGATION 50ML (SYRINGE) ×6 IMPLANT
SYR CONTROL 10ML LL (SYRINGE) ×6 IMPLANT
TAPE CLOTH 4X10 WHT NS (GAUZE/BANDAGES/DRESSINGS) ×3 IMPLANT
TAPE UMBILICAL COTTON 1/8X30 (MISCELLANEOUS) ×3 IMPLANT
TOWEL OR 17X24 6PK STRL BLUE (TOWEL DISPOSABLE) ×3 IMPLANT
TOWEL OR 17X26 10 PK STRL BLUE (TOWEL DISPOSABLE) ×3 IMPLANT
WATER STERILE IRR 1000ML POUR (IV SOLUTION) ×3 IMPLANT
YANKAUER SUCT BULB TIP NO VENT (SUCTIONS) ×3 IMPLANT

## 2015-09-03 NOTE — Op Note (Signed)
Date of Surgery: 09/03/2015  PREOPERATIVE DIAGNOSES: 1. Cervical myelopathy 2. 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 Cervical myelopathy 2. 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 Anterior cervical decompression and fusion C5/6 2. Placement of anterior instrumentation, C5/6 3. Insertion of interbody device x 1 (Titan intervertebral spacer 73mm, lordotic). 4. Use of morselized allograft. 5. Intraoperative use of fluoroscopy.  SURGEON: Phylliss Bob, MD.  ASSISTANTLonn Georgia McKenzie PA-C.  ANESTHESIA: General endotracheal anesthesia.  COMPLICATIONS: None.  DISPOSITION: Stable.  ESTIMATED BLOOD LOSS: Minimal.  INDICATIONS FOR SURGERY: Briefly, [name] [name] is a pleasant 81 year old male, who did present to me with a history of ongoing pain in the back and leg.He did also note deterioration of balance and fine motor skills as well as weakness. I therefore did obtain a C spine MRI, notable for the findings noted above. Given the  patient's ongoing symptoms and the findings on MRI and lack of improvement with, we did discuss proceeding with the procedure noted above. The patient was fully aware of the risks and limitations of the surgery and did elect to proceed.  OPERATIVE DETAILS: On 09/03/2015, the patient was brought to surgery and general endotracheal anesthesia was administered. The patient was placed supine on the hospital bed. The patient's neck was gently extended. The patient's arms were secured to her sides. All bony prominences were padded. The neck was prepped and draped. A left-sided transverse incision was then made. The platysma was incised. A [name] [name] approach was utilized. A self-retaining retractor was placed and the vertebral bodies to be fused were subperiosteally exposed. Caspar pins were then placed into the C5 and C6 vertebral bodies and distraction  was applied. I then proceeded with a thorough and complete C5/6 intervertebral diskectomy. I was very pleased with the decompression  that I was able to accomplish. The endplates were then prepared and the appropriate sized interbody spacer was packed with DBX mix and tamped into position in the usual fashion. The Caspar pins Were removed and bone wax was then placed. I then selected the appropriate sized anterior cervical plate, which was placed over the anterior spine. 14 mm variable angle screws were placed, 2 in each vertebral body from C5 to C6 for a total of 4 vertebral body screws. The screws were then locked to the plate using the cam locking mechanism. I was very pleased with the purchase of each of the screws. I was very pleased with the final fluoroscopic images. The wound was then irrigated. All bleeding was controlled using bipolar electrocautery. The wound was then closed in layers. The platysma was closed using 2-0 Vicryl and the skin was closed using 3-0 Monocryl. Benzoin and Steri-Strips were then applied followed by sterile dressing. All instrument counts were correct at the termination of the procedure.  Of note, [name] [name] was my assistant throughout surgery, and did aid in retraction, suctioning, and closure from start to finish.     Phylliss Bob, MD

## 2015-09-03 NOTE — Anesthesia Procedure Notes (Signed)

## 2015-09-03 NOTE — Transfer of Care (Signed)
Immediate Anesthesia Transfer of Care Note  Patient: [name] [name]  Procedure(s) Performed: Procedure(s) with comments: ANTERIOR CERVICAL DECOMPRESSION FUSION, CERVICAL 5-6 WITH INSTRUMENTATION AND ALLOGRAFT (N/A) - ANTERIOR CERVICAL DECOMPRESSION FUSION, CERVICAL 5-6 WITH INSTRUMENTATION AND ALLOGRAFT  Patient Location: PACU  Anesthesia Type:General  Level of Consciousness: awake and alert   Airway & Oxygen Therapy: Patient Spontanous Breathing and Patient connected to nasal cannula oxygen  Post-op Assessment: Report given to RN, Post -op Vital signs reviewed and stable and Patient moving all extremities X 4  Post vital signs: Reviewed and stable  Last Vitals:  Filed Vitals:   09/03/15 1046 09/03/15 1720  BP: 170/62 154/84  Pulse: 66 95  Temp: 36.4 C 36.6 C  Resp: 18 17    Last Pain: There were no vitals filed for this visit.    Patients Stated Pain Goal: 3 ([id number])  Complications: No apparent anesthesia complications

## 2015-09-03 NOTE — Anesthesia Postprocedure Evaluation (Signed)
Anesthesia Post Note  Patient: [name]  Procedure(s) Performed: Procedure(s) (LRB): ANTERIOR CERVICAL DECOMPRESSION FUSION, CERVICAL 5-6 WITH INSTRUMENTATION AND ALLOGRAFT (N/A)  Patient location during evaluation: PACU Anesthesia Type: General Level of consciousness: awake and alert Pain management: pain level controlled Vital Signs Assessment: post-procedure vital signs reviewed and stable Respiratory status: spontaneous breathing, nonlabored ventilation, respiratory function stable and patient connected to nasal cannula oxygen Cardiovascular status: blood pressure returned to baseline and stable Postop Assessment: no signs of nausea or vomiting Anesthetic complications: no    Last Vitals:  Filed Vitals:   09/03/15 1735 09/03/15 1750  BP: 167/80 165/79  Pulse: 88 88  Temp:    Resp: 14 18    Last Pain: There were no vitals filed for this visit.               [name]

## 2015-09-03 NOTE — Progress Notes (Signed)
Dr Orene Desanctis notified of cbg 70,and 71. No orders received,will repeat cbg in one hr.Marland Kitchen

## 2015-09-03 NOTE — Anesthesia Preprocedure Evaluation (Addendum)
Anesthesia Evaluation  Patient identified by MRN, date of birth, ID band Patient awake    Reviewed: Allergy & Precautions, NPO status , Patient's Chart, lab work & pertinent test results  History of Anesthesia Complications Negative for: history of anesthetic complications  Airway Mallampati: II  TM Distance: >3 FB Neck ROM: Limited    Dental  (+) Edentulous Upper, Edentulous Lower   Pulmonary neg pulmonary ROS, former smoker,    breath sounds clear to auscultation       Cardiovascular hypertension,  Rhythm:Regular Rate:Normal     Neuro/Psych  Neuromuscular disease    GI/Hepatic GERD  ,  Endo/Other  diabetes  Renal/GU      Musculoskeletal  (+) Arthritis ,   Abdominal   Peds  Hematology   Anesthesia Other Findings   Reproductive/Obstetrics                            Anesthesia Physical Anesthesia Plan  ASA: III  Anesthesia Plan: General   Post-op Pain Management:    Induction: Intravenous  Airway Management Planned: Oral ETT  Additional Equipment:   Intra-op Plan:   Post-operative Plan: Extubation in OR  Informed Consent:   Dental advisory given  Plan Discussed with: CRNA and Surgeon  Anesthesia Plan Comments:         Anesthesia Quick Evaluation

## 2015-09-04 ENCOUNTER — Encounter (HOSPITAL_COMMUNITY): Payer: Self-pay | Admitting: Orthopedic Surgery

## 2015-09-04 DIAGNOSIS — E119 Type 2 diabetes mellitus without complications: Secondary | ICD-10-CM | POA: Diagnosis not present

## 2015-09-04 DIAGNOSIS — H409 Unspecified glaucoma: Secondary | ICD-10-CM | POA: Diagnosis not present

## 2015-09-04 DIAGNOSIS — K219 Gastro-esophageal reflux disease without esophagitis: Secondary | ICD-10-CM | POA: Diagnosis not present

## 2015-09-04 DIAGNOSIS — I10 Essential (primary) hypertension: Secondary | ICD-10-CM | POA: Diagnosis not present

## 2015-09-04 DIAGNOSIS — M50022 Cervical disc disorder at C5-C6 level with myelopathy: Secondary | ICD-10-CM | POA: Diagnosis not present

## 2015-09-04 DIAGNOSIS — G9520 Unspecified cord compression: Secondary | ICD-10-CM | POA: Diagnosis not present

## 2015-09-04 LAB — GLUCOSE, CAPILLARY: Glucose-Capillary: 99 mg/dL (ref 65–99)

## 2015-09-04 MED FILL — Thrombin For Soln 20000 Unit: CUTANEOUS | Qty: 1 | Status: AC

## 2015-09-04 NOTE — Progress Notes (Signed)
    Patient doing well Minimal neck pain Reports improvement in balance   Physical Exam: Filed Vitals:   09/04/15 0113 09/04/15 0617  BP: 136/62 151/62  Pulse: 86 82  Temp: 99.3 F (37.4 C) 98.7 F (37.1 C)  Resp: 18 18   Patient looks comfortable Dressing in place NVI  POD #1 s/p C5/6 ACDF doing very well  - encourage ambulation - Percocet for pain, Valium for muscle spasms - d/c home today with f/u in 2 weeks

## 2015-09-04 NOTE — Care Management Note (Signed)
Case Management Note  Patient Details  Name: [name] [name] MRN: [id number] Date of Birth: [birth year]  Subjective/Objective:                    Action/Plan: Patient was admitted for a ANTERIOR CERVICAL DECOMPRESSION FUSION, CERVICAL 5-6 WITH INSTRUMENTATION AND ALLOGRAFT.  Lives at home with family. Will follow for discharge needs pending PT/OT evals and physician orders.  Expected Discharge Date:  09/05/15               Expected Discharge Plan:     In-House Referral:     Discharge planning Services     Post Acute Care Choice:    Choice offered to:     DME Arranged:    DME Agency:     HH Arranged:    HH Agency:     Status of Service:  In process, will continue to follow  Medicare Important Message Given:    Date Medicare IM Given:    Medicare IM give by:    Date Additional Medicare IM Given:    Additional Medicare Important Message give by:     If discussed at [place] of Stay Meetings, dates discussed:    Additional CommentsRolm Baptise, RN 09/04/2015, 9:15 AM [phone]

## 2015-09-16 DIAGNOSIS — Z9889 Other specified postprocedural states: Secondary | ICD-10-CM | POA: Diagnosis not present

## 2015-09-28 ENCOUNTER — Encounter: Payer: Medicare Other | Admitting: Family Medicine

## 2015-10-05 ENCOUNTER — Other Ambulatory Visit: Payer: Self-pay | Admitting: Family Medicine

## 2015-10-05 NOTE — Telephone Encounter (Signed)
Rx sent to the pharmacy by e-script.//AB/CMA 

## 2015-10-16 DIAGNOSIS — G959 Disease of spinal cord, unspecified: Secondary | ICD-10-CM | POA: Diagnosis not present

## 2015-10-23 DIAGNOSIS — S92424A Nondisplaced fracture of distal phalanx of right great toe, initial encounter for closed fracture: Secondary | ICD-10-CM | POA: Diagnosis not present

## 2015-10-28 NOTE — Discharge Summary (Signed)
Patient ID: [name] [name] MRN: [id number] DOB/AGE: [birth year] 79 y.o.  Admit date: 09/03/2015 Discharge date: 09/04/2015  Admission Diagnoses:  Active Problems:   Myelopathy of cervical spinal cord with cervical radiculopathy   Discharge Diagnoses:  Same  Past Medical History  Diagnosis Date  . Hyperlipidemia     5 yrs  . Diverticulosis 2009  . Hypertension   . Diabetes mellitus 1955    type 2 - fasting 100-120  . GERD (gastroesophageal reflux disease)   . History of shingles   . Glaucoma     bilateral  . Osteoarthritis     "knees" (01/31/2014)  . Prostate cancer ([place])     "no treatments; it's going away by itself w/the pills I take"    Surgeries: Procedure(s): ANTERIOR CERVICAL DECOMPRESSION FUSION, CERVICAL 5-6 WITH INSTRUMENTATION AND ALLOGRAFT on 09/03/2015   Consultants:  None  Discharged Condition: Improved  Hospital Course: [name] [name] is an 81 y.o. male who was admitted 09/03/2015 for operative treatment of radiculopathy. Patient has severe unremitting pain that affects sleep, daily activities, and work/hobbies. After pre-op clearance the patient was taken to the operating room on 09/03/2015 and underwent  Procedure(s): ANTERIOR CERVICAL DECOMPRESSION FUSION, CERVICAL 5-6 [place].    Patient was given perioperative antibiotics:  Anti-infectives    Start     Dose/Rate Route Frequency Ordered Stop   09/03/15 2030  ceFAZolin (ANCEF) IVPB 1 g/50 mL premix     1 g 100 mL/hr over 30 Minutes Intravenous Every 8 hours 09/03/15 2016 09/04/15 0438   09/03/15 1037  ceFAZolin (ANCEF) 2-4 GM/100ML-% IVPB    Comments:  Merryl Hacker   : cabinet override      09/03/15 1037 09/03/15 2244   09/03/15 1035  ceFAZolin (ANCEF) IVPB 2g/100 mL premix     2 g 200 mL/hr over 30 Minutes Intravenous On call to O.R. 09/03/15 1035 09/03/15 1520       Patient was given sequential compression devices, early ambulation to prevent DVT.  Patient benefited  maximally from hospital stay and there were no complications.    Recent vital signs: BP 125/70 mmHg  Pulse 85  Temp(Src) 98 F (36.7 C) (Oral)  Resp 18  Wt 75.9 kg (167 lb 5.3 oz)  SpO2 100%  Discharge Medications:     Medication List    STOP taking these medications        glipiZIDE 5 MG tablet  Commonly known as:  GLUCOTROL      TAKE these medications        ACCU-CHEK AVIVA PLUS test strip  Generic drug:  glucose blood  Check your blood sugar twice daily. E11.9     CO Q 10 PO  Take 50 mg by mouth daily.     finasteride 5 MG tablet  Commonly known as:  PROSCAR  Take 5 mg by mouth daily.     furosemide 20 MG tablet  Commonly known as:  LASIX  Take 1 tablet (20 mg total) by mouth every other day.     gabapentin 600 MG tablet  Commonly known as:  NEURONTIN  take 1 tablet by mouth three times a day     latanoprost 0.005 % ophthalmic solution  Commonly known as:  XALATAN  Place 1 drop into both eyes at bedtime.     metFORMIN 1000 MG tablet  Commonly known as:  GLUCOPHAGE  take 1 tablet by mouth twice a day with food     omeprazole 20 MG  capsule  Commonly known as:  PRILOSEC  Take 1 capsule (20 mg total) by mouth daily.     ramipril 2.5 MG capsule  Commonly known as:  ALTACE  Take 1 capsule (2.5 mg total) by mouth daily.     simvastatin 40 MG tablet  Commonly known as:  ZOCOR  Take 1 tablet (40 mg total) by mouth daily.        Diagnostic Studies: No results found.  Disposition: 01-Home or Self Care   POD #1 s/p C5/6 ACDF doing very well  - encourage ambulation - Percocet for pain, Valium for muscle spasms - d/c home today with f/u in 2 weeks -Written scripts for pain signed and in chart -D/C instructions sheet printed and in chart   Signed: Justice Britain 10/28/2015, 2:59 PM

## 2015-10-30 DIAGNOSIS — S92424D Nondisplaced fracture of distal phalanx of right great toe, subsequent encounter for fracture with routine healing: Secondary | ICD-10-CM | POA: Diagnosis not present

## 2015-11-13 ENCOUNTER — Other Ambulatory Visit: Payer: Self-pay | Admitting: Family Medicine

## 2015-11-27 DIAGNOSIS — S92424D Nondisplaced fracture of distal phalanx of right great toe, subsequent encounter for fracture with routine healing: Secondary | ICD-10-CM | POA: Diagnosis not present

## 2015-12-02 DIAGNOSIS — H401131 Primary open-angle glaucoma, bilateral, mild stage: Secondary | ICD-10-CM | POA: Diagnosis not present

## 2015-12-02 DIAGNOSIS — H2513 Age-related nuclear cataract, bilateral: Secondary | ICD-10-CM | POA: Diagnosis not present

## 2015-12-02 DIAGNOSIS — H04123 Dry eye syndrome of bilateral lacrimal glands: Secondary | ICD-10-CM | POA: Diagnosis not present

## 2015-12-15 ENCOUNTER — Telehealth: Payer: Self-pay | Admitting: Family Medicine

## 2015-12-15 ENCOUNTER — Ambulatory Visit (INDEPENDENT_AMBULATORY_CARE_PROVIDER_SITE_OTHER): Payer: Medicare Other | Admitting: Family Medicine

## 2015-12-15 ENCOUNTER — Encounter: Payer: Self-pay | Admitting: Family Medicine

## 2015-12-15 VITALS — BP 160/82 | HR 55 | Temp 98.6°F

## 2015-12-15 DIAGNOSIS — E1165 Type 2 diabetes mellitus with hyperglycemia: Secondary | ICD-10-CM | POA: Diagnosis not present

## 2015-12-15 DIAGNOSIS — I10 Essential (primary) hypertension: Secondary | ICD-10-CM | POA: Diagnosis not present

## 2015-12-15 DIAGNOSIS — K219 Gastro-esophageal reflux disease without esophagitis: Secondary | ICD-10-CM | POA: Diagnosis not present

## 2015-12-15 DIAGNOSIS — E1151 Type 2 diabetes mellitus with diabetic peripheral angiopathy without gangrene: Secondary | ICD-10-CM | POA: Diagnosis not present

## 2015-12-15 DIAGNOSIS — E785 Hyperlipidemia, unspecified: Secondary | ICD-10-CM

## 2015-12-15 DIAGNOSIS — IMO0002 Reserved for concepts with insufficient information to code with codable children: Secondary | ICD-10-CM

## 2015-12-15 LAB — COMPREHENSIVE METABOLIC PANEL
ALT: 13 U/L (ref 0–53)
AST: 18 U/L (ref 0–37)
Albumin: 4.2 g/dL (ref 3.5–5.2)
Alkaline Phosphatase: 65 U/L (ref 39–117)
BILIRUBIN TOTAL: 0.5 mg/dL (ref 0.2–1.2)
BUN: 13 mg/dL (ref 6–23)
CALCIUM: 9.1 mg/dL (ref 8.4–10.5)
CO2: 27 meq/L (ref 19–32)
CREATININE: 0.97 mg/dL (ref 0.40–1.50)
Chloride: 105 mEq/L (ref 96–112)
GFR: 95.95 mL/min (ref >60.00)
GLUCOSE: 63 mg/dL — AB (ref 70–99)
Potassium: 3.6 mEq/L (ref 3.5–5.1)
Sodium: 139 mEq/L (ref 135–145)
Total Protein: 6.9 g/dL (ref 6.0–8.3)

## 2015-12-15 LAB — LIPID PANEL
CHOL/HDL RATIO: 3
Cholesterol: 168 mg/dL (ref 0–200)
HDL: 50.6 mg/dL (ref >39.00)
LDL Cholesterol: 103 mg/dL — ABNORMAL HIGH (ref 0–99)
NonHDL: 116.91
Triglycerides: 71 mg/dL (ref 0.0–149.0)
VLDL: 14.2 mg/dL (ref 0.0–40.0)

## 2015-12-15 LAB — HEMOGLOBIN A1C: Hgb A1c MFr Bld: 6.5 % (ref 4.6–6.5)

## 2015-12-15 MED ORDER — SIMVASTATIN 40 MG PO TABS: 40.0000 mg | ORAL_TABLET | Freq: Every day | ORAL | 1 refills | Status: DC

## 2015-12-15 MED ORDER — FUROSEMIDE 20 MG PO TABS: 20.0000 mg | ORAL_TABLET | ORAL | 5 refills | Status: DC

## 2015-12-15 MED ORDER — RAMIPRIL 2.5 MG PO CAPS: 2.5000 mg | ORAL_CAPSULE | Freq: Every day | ORAL | 5 refills | Status: DC

## 2015-12-15 MED ORDER — METFORMIN HCL 1000 MG PO TABS: 1000.0000 mg | ORAL_TABLET | Freq: Two times a day (BID) | ORAL | 2 refills | Status: DC

## 2015-12-15 MED ORDER — GLIPIZIDE 5 MG PO TABS: 5.0000 mg | ORAL_TABLET | Freq: Two times a day (BID) | ORAL | 5 refills | Status: DC

## 2015-12-15 MED ORDER — OMEPRAZOLE 20 MG PO CPDR: 20.0000 mg | DELAYED_RELEASE_CAPSULE | Freq: Every day | ORAL | 11 refills | Status: DC

## 2015-12-15 NOTE — Patient Instructions (Signed)

## 2015-12-15 NOTE — Assessment & Plan Note (Signed)
Stable con't meds 

## 2015-12-15 NOTE — Progress Notes (Signed)
Pre visit review using our clinic review tool, if applicable. No additional management support is needed unless otherwise documented below in the visit note. 

## 2015-12-15 NOTE — Telephone Encounter (Signed)
°  Relationship to patient: Self  Can be reached: [phone]   Pharmacy:  [facility], [name] [address], [place] [phone] (Phone) [phone] (Fax)   Reason for call: Patient states that he needs an approval for Orthopedic shoes sent to pharmacy. Plse adv

## 2015-12-15 NOTE — Progress Notes (Signed)
Patient ID: [name] [name], male    DOB: Dec 09, 1934  Age: 81 y.o. MRN: [id number]    Subjective:  Subjective  HPI [name] [name] presents for f/u dm, cholesterol and htn.  Pt doing well after neck surgery.  No complaints.    HPI HYPERTENSION   Blood pressure range-running high per pt  Chest pain- no      Dyspnea- no Lightheadedness- no   Edema- no  Other side effects - no   Medication compliance: good Low salt diet- yes    DIABETES    Blood Sugar ranges-82-190  Polyuria- no New Visual problems- no  Hypoglycemic symptoms- no  Other side effects-no Medication compliance - good Last eye exam- last week Foot exam- today   HYPERLIPIDEMIA  Medication compliance- good RUQ pain- no  Muscle aches- no Other side effects-no   Review of Systems  Constitutional: Negative for appetite change, diaphoresis, fatigue and unexpected weight change.  Eyes: Negative for pain, redness and visual disturbance.  Respiratory: Negative for cough, chest tightness, shortness of breath and wheezing.   Cardiovascular: Negative for chest pain, palpitations and leg swelling.  Endocrine: Negative for cold intolerance, heat intolerance, polydipsia, polyphagia and polyuria.  Genitourinary: Negative for difficulty urinating, dysuria and frequency.  Neurological: Negative for dizziness, light-headedness, numbness and headaches.    History Past Medical History:  Diagnosis Date  . Diabetes mellitus 1955   type 2 - fasting 100-120  . Diverticulosis 2009  . GERD (gastroesophageal reflux disease)   . Glaucoma    bilateral  . History of shingles   . Hyperlipidemia    5 yrs  . Hypertension   . Osteoarthritis    "knees" (01/31/2014)  . Prostate cancer ([place])    "no treatments; it's going away by itself w/the pills I take"    He has a past surgical history that includes Colonoscopy; Total knee arthroplasty (Right, 01/31/2014); Tonsillectomy; Total knee arthroplasty (Right, 01/31/2014); Joint replacement;  Fine needle aspiration (Left, 06/10/15); Fine needle aspiration (05/26/15); and Anterior cervical decomp/discectomy fusion (N/A, 09/03/2015).   His family history includes Arthritis in his other; Cancer in his other; Diabetes in his other; Heart disease in his brother.He reports that he quit smoking about 20 years ago. His smoking use included Cigars. He quit after 2.00 years of use. He has never used smokeless tobacco. He reports that he does not drink alcohol or use drugs.  Current Outpatient Prescriptions on File Prior to Visit  Medication Sig Dispense Refill  . ACCU-CHEK AVIVA PLUS test strip Check your blood sugar twice daily. E11.9 100 each 9  . Coenzyme Q10 (CO Q 10 PO) Take 50 mg by mouth daily.    . finasteride (PROSCAR) 5 MG tablet Take 5 mg by mouth daily.    Marland Kitchen gabapentin (NEURONTIN) 600 MG tablet take 1 tablet by mouth three times a day (Patient taking differently: take 1 tablet by mouth two times a day) 90 tablet 5  . latanoprost (XALATAN) 0.005 % ophthalmic solution Place 1 drop into both eyes at bedtime.     No current facility-administered medications on file prior to visit.      Objective:  Objective  Physical Exam  Constitutional: He is oriented to person, place, and time. Vital signs are normal. He appears well-developed and well-nourished. He is sleeping.  HENT:  Head: Normocephalic and atraumatic.  Mouth/Throat: Oropharynx is clear and moist.  Eyes: EOM are normal. Pupils are equal, round, and reactive to light.  Neck: Normal range of motion. Neck supple. No  thyromegaly present.  Cardiovascular: Normal rate and regular rhythm.   No murmur heard. Pulmonary/Chest: Effort normal and breath sounds normal. No respiratory distress. He has no wheezes. He has no rales. He exhibits no tenderness.  Musculoskeletal: He exhibits no edema or tenderness.  Neurological: He is alert and oriented to person, place, and time.  Skin: Skin is warm and dry.  Psychiatric: He has a normal mood  and affect. His behavior is normal. Judgment and thought content normal.  Sensory exam of the foot is normal, tested with the monofilament. Good pulses, no lesions or ulcers, good peripheral pulses.  BP (!) 160/82 (BP Location: Left Arm, Patient Position: Sitting, Cuff Size: Normal)   Pulse (!) 55   Temp 98.6 F (37 C) (Oral)   SpO2 98%  Wt Readings from Last 3 Encounters:  09/03/15 167 lb 5.3 oz (75.9 kg)  08/31/15 163 lb 7 oz (74.1 kg)  06/23/15 169 lb 9.6 oz (76.9 kg)     Lab Results  Component Value Date   WBC 4.0 08/31/2015   HGB 11.3 (L) 08/31/2015   HCT 35.2 (L) 08/31/2015   PLT 315 08/31/2015   GLUCOSE 63 (L) 12/15/2015   CHOL 168 12/15/2015   TRIG 71.0 12/15/2015   HDL 50.60 12/15/2015   LDLCALC 103 (H) 12/15/2015   ALT 13 12/15/2015   AST 18 12/15/2015   NA 139 12/15/2015   K 3.6 12/15/2015   CL 105 12/15/2015   CREATININE 0.97 12/15/2015   BUN 13 12/15/2015   CO2 27 12/15/2015   TSH 1.89 08/17/2010   PSA 1.55 04/11/2014   INR 0.95 08/31/2015   HGBA1C 6.5 12/15/2015   MICROALBUR 0.9 06/30/2015    Dg Chest 2 View  Result Date: 08/31/2015 CLINICAL DATA:  Preoperative evaluation for cervical fusion EXAM: CHEST  2 VIEW COMPARISON:  01/31/2014 FINDINGS: Cardiac shadow is within normal limits. The lungs are well aerated bilaterally. No focal infiltrate or sizable effusion is seen. No acute bony abnormality is noted. IMPRESSION: No active cardiopulmonary disease. Electronically Signed   By: Inez Catalina M.D.   On: 08/31/2015 10:04     Assessment & Plan:  Plan  I have changed [name] [name]'s ramipril, metFORMIN, and glipiZIDE. I am also having him maintain his finasteride, latanoprost, ACCU-CHEK AVIVA PLUS, gabapentin, Coenzyme Q10 (CO Q 10 PO), acetaminophen-codeine, traMADol, methocarbamol, simvastatin, omeprazole, and furosemide.  Meds ordered this encounter  Medications  . acetaminophen-codeine (TYLENOL #3) 300-30 MG tablet    Sig: Take 1 tablet by mouth as  needed.    Refill:  0  . traMADol (ULTRAM) 50 MG tablet    Sig: Take 1 tablet by mouth daily.    Refill:  0  . methocarbamol (ROBAXIN) 500 MG tablet    Sig: Take 1 tablet by mouth daily.    Refill:  0  . simvastatin (ZOCOR) 40 MG tablet    Sig: Take 1 tablet (40 mg total) by mouth daily.    Dispense:  90 tablet    Refill:  1  . ramipril (ALTACE) 2.5 MG capsule    Sig: Take 1 capsule (2.5 mg total) by mouth daily.    Dispense:  30 capsule    Refill:  5  . omeprazole (PRILOSEC) 20 MG capsule    Sig: Take 1 capsule (20 mg total) by mouth daily.    Dispense:  30 capsule    Refill:  11  . metFORMIN (GLUCOPHAGE) 1000 MG tablet    Sig: Take 1 tablet (1,000  mg total) by mouth 2 (two) times daily with a meal.    Dispense:  60 tablet    Refill:  2  . glipiZIDE (GLUCOTROL) 5 MG tablet    Sig: Take 1 tablet (5 mg total) by mouth 2 (two) times daily.    Dispense:  60 tablet    Refill:  5  . furosemide (LASIX) 20 MG tablet    Sig: Take 1 tablet (20 mg total) by mouth every other day.    Dispense:  30 tablet    Refill:  5    Problem List Items Addressed This Visit      Unprioritized   HTN, goal below 140/90    Stable con't meds       Relevant Medications   simvastatin (ZOCOR) 40 MG tablet   ramipril (ALTACE) 2.5 MG capsule   furosemide (LASIX) 20 MG tablet    Other Visit Diagnoses    DM (diabetes mellitus) type II uncontrolled, periph vascular disorder ([place])    -  Primary   Relevant Medications   simvastatin (ZOCOR) 40 MG tablet   ramipril (ALTACE) 2.5 MG capsule   metFORMIN (GLUCOPHAGE) 1000 MG tablet   glipiZIDE (GLUCOTROL) 5 MG tablet   furosemide (LASIX) 20 MG tablet   Other Relevant Orders   POCT urinalysis dipstick   Comprehensive metabolic panel (Completed)   Hemoglobin A1c (Completed)   Essential hypertension       Relevant Medications   simvastatin (ZOCOR) 40 MG tablet   ramipril (ALTACE) 2.5 MG capsule   furosemide (LASIX) 20 MG tablet   Other Relevant  Orders   POCT urinalysis dipstick   Comprehensive metabolic panel (Completed)   Hyperlipidemia LDL goal <70       Relevant Medications   simvastatin (ZOCOR) 40 MG tablet   ramipril (ALTACE) 2.5 MG capsule   furosemide (LASIX) 20 MG tablet   Other Relevant Orders   POCT urinalysis dipstick   Lipid panel (Completed)   Comprehensive metabolic panel (Completed)   Hemoglobin A1c (Completed)   Gastroesophageal reflux disease, esophagitis presence not specified       Relevant Medications   omeprazole (PRILOSEC) 20 MG capsule      Follow-up: Return in about 6 months (around 06/13/2016) for annual exam, fasting.  Ann Held, DO

## 2015-12-15 NOTE — Telephone Encounter (Signed)
Please advise      KP 

## 2015-12-15 NOTE — Assessment & Plan Note (Signed)
Stable con't metformin and glipizide

## 2015-12-16 NOTE — Telephone Encounter (Signed)
Left message for pt to return my call.

## 2015-12-16 NOTE — Telephone Encounter (Signed)
Has pt seen podiatrist?-  They usually need a more detailed exam in order for ins to approve shoes-- if all they need is an order -- that is fine

## 2015-12-18 NOTE — Telephone Encounter (Signed)
Message left to call the office.    KP 

## 2015-12-22 NOTE — Telephone Encounter (Signed)
Left message for pt to call the office  PC

## 2015-12-25 ENCOUNTER — Ambulatory Visit: Payer: Medicare Other | Admitting: Family Medicine

## 2015-12-29 ENCOUNTER — Other Ambulatory Visit: Payer: Self-pay

## 2015-12-29 MED ORDER — ATORVASTATIN CALCIUM 20 MG PO TABS: 20.0000 mg | ORAL_TABLET | Freq: Every day | ORAL | 2 refills | Status: DC

## 2015-12-29 NOTE — Progress Notes (Signed)
20

## 2015-12-29 NOTE — Telephone Encounter (Signed)
Tried calling the patient and again and left a message to call.  Copy of the labs mailed.    KP

## 2016-01-14 DIAGNOSIS — Z23 Encounter for immunization: Secondary | ICD-10-CM | POA: Diagnosis not present

## 2016-02-26 ENCOUNTER — Other Ambulatory Visit: Payer: Self-pay | Admitting: Family Medicine

## 2016-03-12 DIAGNOSIS — M5416 Radiculopathy, lumbar region: Secondary | ICD-10-CM | POA: Diagnosis not present

## 2016-03-17 DIAGNOSIS — M2560 Stiffness of unspecified joint, not elsewhere classified: Secondary | ICD-10-CM | POA: Diagnosis not present

## 2016-03-17 DIAGNOSIS — M4322 Fusion of spine, cervical region: Secondary | ICD-10-CM | POA: Diagnosis not present

## 2016-03-17 DIAGNOSIS — M542 Cervicalgia: Secondary | ICD-10-CM | POA: Diagnosis not present

## 2016-03-18 DIAGNOSIS — M542 Cervicalgia: Secondary | ICD-10-CM | POA: Diagnosis not present

## 2016-03-18 DIAGNOSIS — M4322 Fusion of spine, cervical region: Secondary | ICD-10-CM | POA: Diagnosis not present

## 2016-03-18 DIAGNOSIS — M2560 Stiffness of unspecified joint, not elsewhere classified: Secondary | ICD-10-CM | POA: Diagnosis not present

## 2016-03-23 DIAGNOSIS — M4322 Fusion of spine, cervical region: Secondary | ICD-10-CM | POA: Diagnosis not present

## 2016-03-23 DIAGNOSIS — M2560 Stiffness of unspecified joint, not elsewhere classified: Secondary | ICD-10-CM | POA: Diagnosis not present

## 2016-03-23 DIAGNOSIS — M542 Cervicalgia: Secondary | ICD-10-CM | POA: Diagnosis not present

## 2016-03-25 DIAGNOSIS — M2560 Stiffness of unspecified joint, not elsewhere classified: Secondary | ICD-10-CM | POA: Diagnosis not present

## 2016-03-25 DIAGNOSIS — M4322 Fusion of spine, cervical region: Secondary | ICD-10-CM | POA: Diagnosis not present

## 2016-03-25 DIAGNOSIS — M542 Cervicalgia: Secondary | ICD-10-CM | POA: Diagnosis not present

## 2016-03-29 DIAGNOSIS — M5416 Radiculopathy, lumbar region: Secondary | ICD-10-CM | POA: Diagnosis not present

## 2016-03-30 DIAGNOSIS — M2560 Stiffness of unspecified joint, not elsewhere classified: Secondary | ICD-10-CM | POA: Diagnosis not present

## 2016-03-30 DIAGNOSIS — M542 Cervicalgia: Secondary | ICD-10-CM | POA: Diagnosis not present

## 2016-03-30 DIAGNOSIS — M4322 Fusion of spine, cervical region: Secondary | ICD-10-CM | POA: Diagnosis not present

## 2016-04-01 DIAGNOSIS — M542 Cervicalgia: Secondary | ICD-10-CM | POA: Diagnosis not present

## 2016-04-01 DIAGNOSIS — M4322 Fusion of spine, cervical region: Secondary | ICD-10-CM | POA: Diagnosis not present

## 2016-04-01 DIAGNOSIS — M2560 Stiffness of unspecified joint, not elsewhere classified: Secondary | ICD-10-CM | POA: Diagnosis not present

## 2016-04-05 DIAGNOSIS — M542 Cervicalgia: Secondary | ICD-10-CM | POA: Diagnosis not present

## 2016-04-05 DIAGNOSIS — M4322 Fusion of spine, cervical region: Secondary | ICD-10-CM | POA: Diagnosis not present

## 2016-04-05 DIAGNOSIS — M2560 Stiffness of unspecified joint, not elsewhere classified: Secondary | ICD-10-CM | POA: Diagnosis not present

## 2016-04-06 ENCOUNTER — Other Ambulatory Visit: Payer: Self-pay | Admitting: Family Medicine

## 2016-04-18 DIAGNOSIS — M2560 Stiffness of unspecified joint, not elsewhere classified: Secondary | ICD-10-CM | POA: Diagnosis not present

## 2016-04-18 DIAGNOSIS — M4322 Fusion of spine, cervical region: Secondary | ICD-10-CM | POA: Diagnosis not present

## 2016-04-18 DIAGNOSIS — M542 Cervicalgia: Secondary | ICD-10-CM | POA: Diagnosis not present

## 2016-04-21 DIAGNOSIS — H401131 Primary open-angle glaucoma, bilateral, mild stage: Secondary | ICD-10-CM | POA: Diagnosis not present

## 2016-04-21 DIAGNOSIS — H04123 Dry eye syndrome of bilateral lacrimal glands: Secondary | ICD-10-CM | POA: Diagnosis not present

## 2016-04-21 DIAGNOSIS — H2513 Age-related nuclear cataract, bilateral: Secondary | ICD-10-CM | POA: Diagnosis not present

## 2016-04-25 DIAGNOSIS — M5416 Radiculopathy, lumbar region: Secondary | ICD-10-CM | POA: Diagnosis not present

## 2016-04-26 ENCOUNTER — Other Ambulatory Visit: Payer: Self-pay | Admitting: Family Medicine

## 2016-05-04 ENCOUNTER — Other Ambulatory Visit: Payer: Self-pay | Admitting: Family Medicine

## 2016-06-13 ENCOUNTER — Encounter: Payer: Self-pay | Admitting: Family Medicine

## 2016-06-13 ENCOUNTER — Ambulatory Visit (INDEPENDENT_AMBULATORY_CARE_PROVIDER_SITE_OTHER): Payer: Medicare Other | Admitting: Family Medicine

## 2016-06-13 VITALS — BP 138/78 | HR 68 | Temp 97.7°F | Resp 16 | Ht 65.5 in | Wt 170.8 lb

## 2016-06-13 DIAGNOSIS — I10 Essential (primary) hypertension: Secondary | ICD-10-CM

## 2016-06-13 DIAGNOSIS — E7800 Pure hypercholesterolemia, unspecified: Secondary | ICD-10-CM

## 2016-06-13 DIAGNOSIS — E1165 Type 2 diabetes mellitus with hyperglycemia: Secondary | ICD-10-CM

## 2016-06-13 DIAGNOSIS — E1121 Type 2 diabetes mellitus with diabetic nephropathy: Secondary | ICD-10-CM

## 2016-06-13 DIAGNOSIS — Z23 Encounter for immunization: Secondary | ICD-10-CM

## 2016-06-13 DIAGNOSIS — Z0000 Encounter for general adult medical examination without abnormal findings: Secondary | ICD-10-CM | POA: Diagnosis not present

## 2016-06-13 DIAGNOSIS — IMO0002 Reserved for concepts with insufficient information to code with codable children: Secondary | ICD-10-CM

## 2016-06-13 LAB — COMPREHENSIVE METABOLIC PANEL
ALBUMIN: 4.4 g/dL (ref 3.5–5.2)
ALT: 10 U/L (ref 0–53)
AST: 13 U/L (ref 0–37)
Alkaline Phosphatase: 65 U/L (ref 39–117)
BUN: 13 mg/dL (ref 6–23)
CALCIUM: 9.6 mg/dL (ref 8.4–10.5)
CHLORIDE: 104 meq/L (ref 96–112)
CO2: 30 meq/L (ref 19–32)
Creatinine, Ser: 0.94 mg/dL (ref 0.40–1.50)
GFR: 99.37 mL/min (ref >60.00)
Glucose, Bld: 89 mg/dL (ref 70–99)
POTASSIUM: 3.9 meq/L (ref 3.5–5.1)
SODIUM: 141 meq/L (ref 135–145)
Total Bilirubin: 0.4 mg/dL (ref 0.2–1.2)
Total Protein: 6.9 g/dL (ref 6.0–8.3)

## 2016-06-13 LAB — CBC
HEMATOCRIT: 36.5 % — AB (ref 39.0–52.0)
Hemoglobin: 12.3 g/dL — ABNORMAL LOW (ref 13.0–17.0)
MCHC: 33.8 g/dL (ref 30.0–36.0)
MCV: 96.3 fl (ref 78.0–100.0)
Platelets: 275 10*3/uL (ref 150.0–400.0)
RBC: 3.79 Mil/uL — ABNORMAL LOW (ref 4.22–5.81)
RDW: 13.8 % (ref 11.5–15.5)
WBC: 3.7 10*3/uL — ABNORMAL LOW (ref 4.0–10.5)

## 2016-06-13 LAB — LIPID PANEL
CHOL/HDL RATIO: 3
CHOLESTEROL: 147 mg/dL (ref 0–200)
HDL: 47.3 mg/dL (ref >39.00)
LDL CALC: 83 mg/dL (ref 0–99)
NonHDL: 99.34
TRIGLYCERIDES: 81 mg/dL (ref 0.0–149.0)
VLDL: 16.2 mg/dL (ref 0.0–40.0)

## 2016-06-13 LAB — HEMOGLOBIN A1C: Hgb A1c MFr Bld: 7 % — ABNORMAL HIGH (ref 4.6–6.5)

## 2016-06-13 MED ORDER — ZOSTER VAC RECOMB ADJUVANTED 50 MCG/0.5ML IM SUSR: 1.0000 mL | Freq: Once | INTRAMUSCULAR | 1 refills | Status: AC

## 2016-06-13 NOTE — Patient Instructions (Signed)
Preventive Care 80 Years and Older, Male Preventive care refers to lifestyle choices and visits with your health care provider that can promote health and wellness. What does preventive care include?  A yearly physical exam. This is also called an annual well check.  Dental exams once or twice a year.  Routine eye exams. Ask your health care provider how often you should have your eyes checked.  Personal lifestyle choices, including:  Daily care of your teeth and gums.  Regular physical activity.  Eating a healthy diet.  Avoiding tobacco and drug use.  Limiting alcohol use.  Practicing safe sex.  Taking low doses of aspirin every day.  Taking vitamin and mineral supplements as recommended by your health care provider. What happens during an annual well check? The services and screenings done by your health care provider during your annual well check will depend on your age, overall health, lifestyle risk factors, and family history of disease. Counseling  Your health care provider may ask you questions about your:  Alcohol use.  Tobacco use.  Drug use.  Emotional well-being.  Home and relationship well-being.  Sexual activity.  Eating habits.  History of falls.  Memory and ability to understand (cognition).  Work and work environment. Screening  You may have the following tests or measurements:  Height, weight, and BMI.  Blood pressure.  Lipid and cholesterol levels. These may be checked every 5 years, or more frequently if you are over 50 years old.  Skin check.  Lung cancer screening. You may have this screening every year starting at age 55 if you have a 30-pack-year history of smoking and currently smoke or have quit within the past 15 years.  Fecal occult blood test (FOBT) of the stool. You may have this test every year starting at age 50.  Flexible sigmoidoscopy or colonoscopy. You may have a sigmoidoscopy every 5 years or a colonoscopy every 10  years starting at age 50.  Prostate cancer screening. Recommendations will vary depending on your family history and other risks.  Hepatitis C blood test.  Hepatitis B blood test.  Sexually transmitted disease (STD) testing.  Diabetes screening. This is done by checking your blood sugar (glucose) after you have not eaten for a while (fasting). You may have this done every 1-3 years.  Abdominal aortic aneurysm (AAA) screening. You may need this if you are a current or former smoker.  Osteoporosis. You may be screened starting at age 70 if you are at high risk. Talk with your health care provider about your test results, treatment options, and if necessary, the need for more tests. Vaccines  Your health care provider may recommend certain vaccines, such as:  Influenza vaccine. This is recommended every year.  Tetanus, diphtheria, and acellular pertussis (Tdap, Td) vaccine. You may need a Td booster every 10 years.  Varicella vaccine. You may need this if you have not been vaccinated.  Zoster vaccine. You may need this after age 60.  Measles, mumps, and rubella (MMR) vaccine. You may need at least one dose of MMR if you were born in 1957 or later. You may also need a second dose.  Pneumococcal 13-valent conjugate (PCV13) vaccine. One dose is recommended after age 80.  Pneumococcal polysaccharide (PPSV23) vaccine. One dose is recommended after age 80.  Meningococcal vaccine. You may need this if you have certain conditions.  Hepatitis A vaccine. You may need this if you have certain conditions or if you travel or work in places where   you may be exposed to hepatitis A.  Hepatitis B vaccine. You may need this if you have certain conditions or if you travel or work in places where you may be exposed to hepatitis B.  Haemophilus influenzae type b (Hib) vaccine. You may need this if you have certain risk factors. Talk to your health care provider about which screenings and vaccines you  need and how often you need them. This information is not intended to replace advice given to you by your health care provider. Make sure you discuss any questions you have with your health care provider. Document Released: 04/24/2015 Document Revised: 12/16/2015 Document Reviewed: 01/27/2015 Elsevier Interactive Patient Education  2017 [employer].

## 2016-06-13 NOTE — Progress Notes (Signed)
Pre visit review using our clinic review tool, if applicable. No additional management support is needed unless otherwise documented below in the visit note. 

## 2016-06-13 NOTE — Progress Notes (Signed)
Subjective:   [name] [name] is a 82 y.o. male who presents for Medicare Annual/Subsequent preventive examination.  Review of Systems:   Review of Systems  Constitutional: Negative for activity change, appetite change and fatigue.  HENT: Negative for hearing loss, congestion, tinnitus and ear discharge.   Eyes: Negative for visual disturbance (see optho q1y -- vision corrected to 20/20 with glasses).  Respiratory: Negative for cough, chest tightness and shortness of breath.   Cardiovascular: Negative for chest pain, palpitations and leg swelling.  Gastrointestinal: Negative for abdominal pain, diarrhea, constipation and abdominal distention.  Genitourinary: Negative for urgency, frequency, decreased urine volume and difficulty urinating.  Musculoskeletal: Negative for back pain, arthralgias and gait problem.  Skin: Negative for color change, pallor and rash.  Neurological: Negative for dizziness, light-headedness, numbness and headaches.  Hematological: Negative for adenopathy. Does not bruise/bleed easily.  Psychiatric/Behavioral: Negative for suicidal ideas, confusion, sleep disturbance, self-injury, dysphoric mood, decreased concentration and agitation.  Pt is able to read and write and can do all ADLs No risk for falling No abuse/ violence in home         Objective:    Vitals: BP 138/78   Pulse 68   Temp 97.7 F (36.5 C) (Oral)   Resp 16   Ht 5' 5.5" (1.664 m)   Wt 170 lb 12.8 oz (77.5 kg)   SpO2 95%   BMI 27.99 kg/m   Body mass index is 27.99 kg/m. BP 138/78   Pulse 68   Temp 97.7 F (36.5 C) (Oral)   Resp 16   Ht 5' 5.5" (1.664 m)   Wt 170 lb 12.8 oz (77.5 kg)   SpO2 95%   BMI 27.99 kg/m  General appearance: alert, cooperative, appears stated age and no distress Head: Normocephalic, without obvious abnormality, atraumatic Eyes: conjunctivae/corneas clear. PERRL, EOM's intact. Fundi benign. Ears: normal TM's and external ear canals both ears Nose: Nares  normal. Septum midline. Mucosa normal. No drainage or sinus tenderness. Throat: lips, mucosa, and tongue normal; teeth and gums normal Neck: no adenopathy, supple, symmetrical, trachea midline and thyroid not enlarged, symmetric, no tenderness/mass/nodules Back: symmetric, no curvature. ROM normal. No CVA tenderness. Lungs: clear to auscultation bilaterally Chest wall: no tenderness Heart: regular rate and rhythm, S1, S2 normal, no murmur, click, rub or gallop Abdomen: soft, non-tender; bowel sounds normal; no masses,  no organomegaly Male genitalia: normal Rectal: normal tone, normal prostate, no masses or tenderness Extremities: extremities normal, atraumatic, no cyanosis or edema Pulses: 2+ and symmetric Skin: Skin color, texture, turgor normal. No rashes or lesions Lymph nodes: Cervical, supraclavicular, and axillary nodes normal. Neurologic: Alert and oriented X 3, normal strength and tone. Normal symmetric reflexes. Normal coordination and gait Tobacco History  Smoking Status  . Former Smoker  . Years: 2.00  . Types: Cigars  . Quit date: 04/12/1995  Smokeless Tobacco  . Never Used     Counseling given: Not Answered   Past Medical History:  Diagnosis Date  . Diabetes mellitus 1955   type 2 - fasting 100-120  . Diverticulosis 2009  . GERD (gastroesophageal reflux disease)   . Glaucoma    bilateral  . History of shingles   . Hyperlipidemia    5 yrs  . Hypertension   . Osteoarthritis    "knees" (01/31/2014)  . Prostate cancer ([place])    "no treatments; it's going away by itself w/the pills I take"   Past Surgical History:  Procedure Laterality Date  . ANTERIOR CERVICAL DECOMP/DISCECTOMY FUSION  N/A 09/03/2015   Procedure: ANTERIOR CERVICAL DECOMPRESSION FUSION, CERVICAL 5-6 WITH INSTRUMENTATION AND ALLOGRAFT;  Surgeon: Phylliss Bob, MD;  Location: [facility];  Service: Orthopedics;  Laterality: N/A;  ANTERIOR CERVICAL DECOMPRESSION FUSION, CERVICAL 5-6 WITH INSTRUMENTATION AND  ALLOGRAFT  . COLONOSCOPY    . FINE NEEDLE ASPIRATION Left 06/10/15   Knee  . FINE NEEDLE ASPIRATION  05/26/15   Back  . JOINT REPLACEMENT    . TONSILLECTOMY    . TOTAL KNEE ARTHROPLASTY Right 01/31/2014  . TOTAL KNEE ARTHROPLASTY Right 01/31/2014   Procedure: RIGHT TOTAL KNEE ARTHROPLASTY;  Surgeon: Kerin Salen, MD;  Location: [facility];  Service: Orthopedics;  Laterality: Right;   Family History  Problem Relation Age of Onset  . Arthritis Other   . Diabetes Other   . Cancer Other     prostate  . Heart disease Brother     questionable   History  Sexual Activity  . Sexual activity: No    Outpatient Encounter Prescriptions as of 06/13/2016  Medication Sig  . ACCU-CHEK AVIVA PLUS test strip Check your blood sugar twice daily. E11.9  . acetaminophen-codeine (TYLENOL #3) 300-30 MG tablet Take 1 tablet by mouth as needed.  Marland Kitchen atorvastatin (LIPITOR) 20 MG tablet take 1 tablet by mouth once daily  . Coenzyme Q10 (CO Q 10 PO) Take 50 mg by mouth daily.  . finasteride (PROSCAR) 5 MG tablet Take 5 mg by mouth daily.  . furosemide (LASIX) 20 MG tablet Take 1 tablet (20 mg total) by mouth every other day.  . gabapentin (NEURONTIN) 600 MG tablet take 1 tablet by mouth three times a day  . glipiZIDE (GLUCOTROL) 5 MG tablet Take 1 tablet (5 mg total) by mouth 2 (two) times daily.  Marland Kitchen glipiZIDE (GLUCOTROL) 5 MG tablet take 1 tablet by mouth twice a day  . latanoprost (XALATAN) 0.005 % ophthalmic solution Place 1 drop into both eyes at bedtime.  . metFORMIN (GLUCOPHAGE) 1000 MG tablet take 1 tablet by mouth twice a day with food  . methocarbamol (ROBAXIN) 500 MG tablet Take 1 tablet by mouth daily.  . Omega-3 Fatty Acids (FISH OIL) 1000 MG CAPS Take 1 capsule by mouth 2 (two) times daily.  Marland Kitchen omeprazole (PRILOSEC) 20 MG capsule take 1 capsule by mouth once daily  . ramipril (ALTACE) 2.5 MG capsule take 1 capsule by mouth once daily  . traMADol (ULTRAM) 50 MG tablet Take 1 tablet by mouth daily.  Marland Kitchen  Zoster Vac Recomb Adjuvanted (SHINGRIX) 50 MCG SUSR Inject 1 mL into the muscle once.   No facility-administered encounter medications on file as of 06/13/2016.     Activities of Daily Living In your present state of health, do you have any difficulty performing the following activities: 06/13/2016 09/03/2015  Hearing? Tempie Donning  Vision? N N  Difficulty concentrating or making decisions? N Y  Walking or climbing stairs? N N  Dressing or bathing? N N  Doing errands, shopping? N N  Some recent data might be hidden    Patient Care Team: Ann Held, DO as PCP - General (Family Medicine) Festus Aloe, MD as Consulting Physician (Urology) Marylynn Pearson, MD as Consulting Physician (Ophthalmology) Frederik Pear, MD as Consulting Physician (Orthopedic Surgery) Leighton Parody, PA-C as Physician Assistant (Orthopedic Surgery) Normajean Glasgow, MD as Attending Physician (Physical Medicine and Rehabilitation) Phylliss Bob, MD as Consulting Physician (Orthopedic Surgery)   Assessment:    CPE Exercise Activities and Dietary recommendations Current Exercise Habits: The patient  does not participate in regular exercise at present, Exercise limited by: None identified  Goals    None     Fall Risk Fall Risk  06/23/2015 05/01/2014  Falls in the past year? No No   Depression Screen PHQ 2/9 Scores 06/23/2015 05/01/2014  PHQ - 2 Score 0 0    Cognitive Function MMSE - Mini Mental State Exam 06/13/2016  Orientation to time 5  Orientation to Place 5  Registration 3  Attention/ Calculation 5  Recall 3  Language- name 2 objects 2  Language- repeat 1  Language- follow 3 step command 3  Language- read & follow direction 1  Write a sentence 1  Copy design 1  Total score 30        Immunization History  Administered Date(s) Administered  . Influenza Split 12/21/2011  . Influenza Whole 12/19/2007, 01/16/2009, 01/27/2010  . Influenza, High Dose Seasonal PF 12/15/2014  . Influenza-Unspecified  01/01/2014  . Pneumococcal Conjugate-13 05/01/2014, 09/16/2014  . Pneumococcal Polysaccharide-23 12/19/2007  . Td 10/16/2007   Screening Tests Health Maintenance  Topic Date Due  . OPHTHALMOLOGY EXAM  06/04/2016  . HEMOGLOBIN A1C  06/13/2016  . FOOT EXAM  12/14/2016  . TETANUS/TDAP  10/15/2017  . INFLUENZA VACCINE  Addressed  . PNA vac Low Risk Adult  Completed      Plan:    During the course of the visit the patient was educated and counseled about the following appropriate screening and preventive services:   Vaccines to include Pneumoccal, Influenza, Hepatitis B, Td, Zostavax, HCV  Electrocardiogram  Cardiovascular Disease  Colorectal cancer screening  Diabetes screening  Prostate Cancer Screening  Glaucoma screening  Nutrition counseling   Smoking cessation counseling  Patient Instructions (the written plan) was given to the patient.  1. Uncontrolled type 2 diabetes mellitus with diabetic nephropathy, without long-term current use of insulin (HCC) Check labs stable - Hemoglobin A1c - CBC  2. Pure hypercholesterolemia Check labs - Lipid panel - CBC  3. HTN, goal below 140/90 Stable con't meds  - Comprehensive metabolic panel - CBC  4. Encounter for Medicare annual wellness exam See above  5. Need for shingles vaccine   - Zoster Vac Recomb Adjuvanted ([place]) 50 MCG SUSR; Inject 1 mL into the muscle once.  Dispense: 1 each; Refill: [place], DO  06/13/2016

## 2016-06-13 NOTE — Progress Notes (Signed)
   Subjective:   HPI ROS  Objective:    Physical Exam Assessment & Plan:   

## 2016-06-20 ENCOUNTER — Other Ambulatory Visit: Payer: Self-pay | Admitting: Family Medicine

## 2016-06-20 DIAGNOSIS — E119 Type 2 diabetes mellitus without complications: Secondary | ICD-10-CM

## 2016-06-20 DIAGNOSIS — I10 Essential (primary) hypertension: Secondary | ICD-10-CM

## 2016-06-20 DIAGNOSIS — E785 Hyperlipidemia, unspecified: Secondary | ICD-10-CM

## 2016-06-20 DIAGNOSIS — N401 Enlarged prostate with lower urinary tract symptoms: Secondary | ICD-10-CM | POA: Diagnosis not present

## 2016-06-27 DIAGNOSIS — C61 Malignant neoplasm of prostate: Secondary | ICD-10-CM | POA: Diagnosis not present

## 2016-06-27 DIAGNOSIS — N400 Enlarged prostate without lower urinary tract symptoms: Secondary | ICD-10-CM | POA: Diagnosis not present

## 2016-06-27 DIAGNOSIS — R3121 Asymptomatic microscopic hematuria: Secondary | ICD-10-CM | POA: Diagnosis not present

## 2016-07-19 ENCOUNTER — Other Ambulatory Visit: Payer: Self-pay | Admitting: Family Medicine

## 2016-07-22 DIAGNOSIS — H401111 Primary open-angle glaucoma, right eye, mild stage: Secondary | ICD-10-CM | POA: Diagnosis not present

## 2016-08-15 ENCOUNTER — Other Ambulatory Visit: Payer: Self-pay | Admitting: Family Medicine

## 2016-08-15 DIAGNOSIS — I10 Essential (primary) hypertension: Secondary | ICD-10-CM

## 2016-08-15 MED ORDER — OMEPRAZOLE 20 MG PO CPDR: 20.0000 mg | DELAYED_RELEASE_CAPSULE | Freq: Every day | ORAL | 5 refills | Status: DC

## 2016-08-15 MED ORDER — GLIPIZIDE 5 MG PO TABS: 5.0000 mg | ORAL_TABLET | Freq: Two times a day (BID) | ORAL | 5 refills | Status: DC

## 2016-08-15 MED ORDER — RAMIPRIL 2.5 MG PO CAPS
2.5000 mg | ORAL_CAPSULE | Freq: Every day | ORAL | 5 refills | Status: DC
Start: 2016-08-15 — End: 2016-08-18

## 2016-08-15 MED ORDER — METFORMIN HCL 1000 MG PO TABS: 1000.0000 mg | ORAL_TABLET | Freq: Two times a day (BID) | ORAL | 2 refills | Status: DC

## 2016-08-15 MED ORDER — FUROSEMIDE 20 MG PO TABS: 20.0000 mg | ORAL_TABLET | ORAL | 5 refills | Status: DC

## 2016-08-18 ENCOUNTER — Other Ambulatory Visit: Payer: Self-pay | Admitting: Family Medicine

## 2016-08-18 DIAGNOSIS — E1151 Type 2 diabetes mellitus with diabetic peripheral angiopathy without gangrene: Secondary | ICD-10-CM

## 2016-08-18 DIAGNOSIS — IMO0002 Reserved for concepts with insufficient information to code with codable children: Secondary | ICD-10-CM

## 2016-08-18 DIAGNOSIS — E1165 Type 2 diabetes mellitus with hyperglycemia: Principal | ICD-10-CM

## 2016-08-18 MED ORDER — ATORVASTATIN CALCIUM 20 MG PO TABS: 20.0000 mg | ORAL_TABLET | Freq: Every day | ORAL | 2 refills | Status: DC

## 2016-08-18 MED ORDER — GLIPIZIDE 5 MG PO TABS: 5.0000 mg | ORAL_TABLET | Freq: Two times a day (BID) | ORAL | 1 refills | Status: DC

## 2016-08-18 MED ORDER — OMEPRAZOLE 20 MG PO CPDR: 20.0000 mg | DELAYED_RELEASE_CAPSULE | Freq: Every day | ORAL | 2 refills | Status: DC

## 2016-08-18 MED ORDER — RAMIPRIL 2.5 MG PO CAPS
2.5000 mg | ORAL_CAPSULE | Freq: Every day | ORAL | 2 refills | Status: DC
Start: 2016-08-18 — End: 2017-05-29

## 2016-08-18 MED ORDER — METFORMIN HCL 1000 MG PO TABS: 1000.0000 mg | ORAL_TABLET | Freq: Two times a day (BID) | ORAL | 2 refills | Status: DC

## 2016-08-18 NOTE — Telephone Encounter (Signed)
Updated pharmacy and sent in prescriptions Called the patient left a detailed message scripts sent in.

## 2016-08-18 NOTE — Telephone Encounter (Signed)
Caller name: [name] [name]  Relation to pt: self  Call back number: [phone] Pharmacy: [facility] Tel [phone]  Reason for call: Pt called wanting to inform that would like to change pharmacy on his chart, pt would like to have all his rx [facility], [place] tel [phone]. Pt stated had a request of meds on Monday and is needing to have them sent to the pharmacy mentioned above since he only has one day left of supplies. Pt mentioned that the pharmacy had requested on Monday 08-15-2016 and that they have not received a respond. Pt is needing 90 day supply. Please advise.

## 2016-08-23 DIAGNOSIS — H401131 Primary open-angle glaucoma, bilateral, mild stage: Secondary | ICD-10-CM | POA: Diagnosis not present

## 2016-08-23 DIAGNOSIS — H2513 Age-related nuclear cataract, bilateral: Secondary | ICD-10-CM | POA: Diagnosis not present

## 2016-09-14 ENCOUNTER — Telehealth: Payer: Self-pay | Admitting: *Deleted

## 2016-09-14 NOTE — Telephone Encounter (Signed)
Received Application & Renewal for Disability Parking Placard, forwarded to provider/SLS 06/06

## 2016-09-16 NOTE — Telephone Encounter (Signed)
Form completed and is ready for pick up.  Pt has lab appt scheduled on 09/20/16.  He will pick up form then.

## 2016-09-20 ENCOUNTER — Telehealth: Payer: Self-pay | Admitting: *Deleted

## 2016-09-20 ENCOUNTER — Other Ambulatory Visit (INDEPENDENT_AMBULATORY_CARE_PROVIDER_SITE_OTHER): Payer: Medicare Other

## 2016-09-20 ENCOUNTER — Other Ambulatory Visit: Payer: Self-pay | Admitting: Family Medicine

## 2016-09-20 DIAGNOSIS — I10 Essential (primary) hypertension: Secondary | ICD-10-CM

## 2016-09-20 DIAGNOSIS — E785 Hyperlipidemia, unspecified: Secondary | ICD-10-CM | POA: Diagnosis not present

## 2016-09-20 DIAGNOSIS — E119 Type 2 diabetes mellitus without complications: Secondary | ICD-10-CM | POA: Diagnosis not present

## 2016-09-20 LAB — COMPREHENSIVE METABOLIC PANEL
ALT: 13 U/L (ref 0–53)
AST: 12 U/L (ref 0–37)
Albumin: 4.1 g/dL (ref 3.5–5.2)
Alkaline Phosphatase: 56 U/L (ref 39–117)
BUN: 19 mg/dL (ref 6–23)
CHLORIDE: 104 meq/L (ref 96–112)
CO2: 31 meq/L (ref 19–32)
Calcium: 9.2 mg/dL (ref 8.4–10.5)
Creatinine, Ser: 1 mg/dL (ref 0.40–1.50)
GFR: 92.46 mL/min (ref >60.00)
GLUCOSE: 159 mg/dL — AB (ref 70–99)
POTASSIUM: 4.2 meq/L (ref 3.5–5.1)
Sodium: 139 mEq/L (ref 135–145)
Total Bilirubin: 0.5 mg/dL (ref 0.2–1.2)
Total Protein: 6.5 g/dL (ref 6.0–8.3)

## 2016-09-20 LAB — CBC WITH DIFFERENTIAL/PLATELET
BASOS ABS: 0 10*3/uL (ref 0.0–0.1)
BASOS PCT: 0.6 % (ref 0.0–3.0)
EOS PCT: 1.6 % (ref 0.0–5.0)
Eosinophils Absolute: 0.1 10*3/uL (ref 0.0–0.7)
HEMATOCRIT: 34.6 % — AB (ref 39.0–52.0)
Hemoglobin: 11.8 g/dL — ABNORMAL LOW (ref 13.0–17.0)
LYMPHS PCT: 29.4 % (ref 12.0–46.0)
Lymphs Abs: 1.3 10*3/uL (ref 0.7–4.0)
MCHC: 34 g/dL (ref 30.0–36.0)
MCV: 97 fl (ref 78.0–100.0)
MONOS PCT: 11.2 % (ref 3.0–12.0)
Monocytes Absolute: 0.5 10*3/uL (ref 0.1–1.0)
NEUTROS ABS: 2.6 10*3/uL (ref 1.4–7.7)
Neutrophils Relative %: 57.2 % (ref 43.0–77.0)
PLATELETS: 300 10*3/uL (ref 150.0–400.0)
RBC: 3.57 Mil/uL — ABNORMAL LOW (ref 4.22–5.81)
RDW: 14.4 % (ref 11.5–15.5)
WBC: 4.5 10*3/uL (ref 4.0–10.5)

## 2016-09-20 LAB — LIPID PANEL
CHOL/HDL RATIO: 3
Cholesterol: 135 mg/dL (ref 0–200)
HDL: 48.6 mg/dL (ref >39.00)
LDL Cholesterol: 66 mg/dL (ref 0–99)
NONHDL: 86.17
Triglycerides: 100 mg/dL (ref 0.0–149.0)
VLDL: 20 mg/dL (ref 0.0–40.0)

## 2016-09-20 LAB — HEMOGLOBIN A1C: Hgb A1c MFr Bld: 6.6 % — ABNORMAL HIGH (ref 4.6–6.5)

## 2016-09-20 MED ORDER — GLUCOSE BLOOD VI STRP: ORAL_STRIP | 11 refills | Status: DC

## 2016-09-20 NOTE — Telephone Encounter (Signed)
Caller name: Paz  Relation to pt: self  Call back number: [phone] Pharmacy: [id number] - [place], [place].  Reason for call: Pt is requesting needing test strips for the new machine that he got from our office in March, pt states brand name is One Touch. Pt needs one touch strips sent to pharmacy mentioned above. Please advise.

## 2016-09-20 NOTE — Telephone Encounter (Signed)
Sent in

## 2016-09-20 NOTE — Telephone Encounter (Signed)
Ins normally does not pay for that He needs to be aware of it

## 2016-09-20 NOTE — Telephone Encounter (Signed)
Patient requesting blood test to know his blood type, please advise

## 2016-09-27 ENCOUNTER — Other Ambulatory Visit: Payer: Self-pay | Admitting: Family Medicine

## 2016-09-27 MED ORDER — GLUCOSE BLOOD VI STRP: ORAL_STRIP | 6 refills | Status: DC

## 2016-09-28 ENCOUNTER — Other Ambulatory Visit: Payer: Self-pay | Admitting: Family Medicine

## 2016-09-28 DIAGNOSIS — K625 Hemorrhage of anus and rectum: Secondary | ICD-10-CM

## 2016-09-28 DIAGNOSIS — D649 Anemia, unspecified: Secondary | ICD-10-CM

## 2016-10-05 ENCOUNTER — Other Ambulatory Visit (INDEPENDENT_AMBULATORY_CARE_PROVIDER_SITE_OTHER): Payer: Medicare Other

## 2016-10-05 DIAGNOSIS — D649 Anemia, unspecified: Secondary | ICD-10-CM

## 2016-10-05 DIAGNOSIS — K625 Hemorrhage of anus and rectum: Secondary | ICD-10-CM | POA: Diagnosis not present

## 2016-10-05 LAB — FECAL OCCULT BLOOD, IMMUNOCHEMICAL: FECAL OCCULT BLD: NEGATIVE

## 2016-10-10 ENCOUNTER — Encounter: Payer: Self-pay | Admitting: *Deleted

## 2016-10-13 ENCOUNTER — Telehealth: Payer: Self-pay | Admitting: *Deleted

## 2016-10-13 NOTE — Telephone Encounter (Signed)
Pharmacy sent over pa for accu check test strips and ins did not cover.  Ins covers one touch verio strips that we had sent in as well.  Advised patient and he will be by tomorrow to pickup a machine that is up at front desk and ready.    PA archived

## 2016-10-27 ENCOUNTER — Other Ambulatory Visit (INDEPENDENT_AMBULATORY_CARE_PROVIDER_SITE_OTHER): Payer: Medicare Other

## 2016-10-27 DIAGNOSIS — D649 Anemia, unspecified: Secondary | ICD-10-CM

## 2016-10-27 LAB — CBC WITH DIFFERENTIAL/PLATELET
BASOS ABS: 0 10*3/uL (ref 0.0–0.1)
Basophils Relative: 0.7 % (ref 0.0–3.0)
EOS ABS: 0.1 10*3/uL (ref 0.0–0.7)
Eosinophils Relative: 1.9 % (ref 0.0–5.0)
HCT: 36.2 % — ABNORMAL LOW (ref 39.0–52.0)
Hemoglobin: 12.1 g/dL — ABNORMAL LOW (ref 13.0–17.0)
LYMPHS ABS: 1.4 10*3/uL (ref 0.7–4.0)
Lymphocytes Relative: 35.2 % (ref 12.0–46.0)
MCHC: 33.4 g/dL (ref 30.0–36.0)
MCV: 97.7 fl (ref 78.0–100.0)
MONO ABS: 0.5 10*3/uL (ref 0.1–1.0)
MONOS PCT: 13.1 % — AB (ref 3.0–12.0)
NEUTROS PCT: 49.1 % (ref 43.0–77.0)
Neutro Abs: 1.9 10*3/uL (ref 1.4–7.7)
Platelets: 278 10*3/uL (ref 150.0–400.0)
RBC: 3.7 Mil/uL — AB (ref 4.22–5.81)
RDW: 14.4 % (ref 11.5–15.5)
WBC: 3.9 10*3/uL — ABNORMAL LOW (ref 4.0–10.5)

## 2016-10-27 LAB — FERRITIN: Ferritin: 55.3 ng/mL (ref 22.0–322.0)

## 2016-10-27 LAB — IBC PANEL
IRON: 104 ug/dL (ref 42–165)
SATURATION RATIOS: 30.1 % (ref 20.0–50.0)
TRANSFERRIN: 247 mg/dL (ref 212.0–360.0)

## 2016-10-28 ENCOUNTER — Other Ambulatory Visit: Payer: Medicare Other

## 2016-11-01 ENCOUNTER — Other Ambulatory Visit: Payer: Self-pay

## 2016-11-24 DIAGNOSIS — H401131 Primary open-angle glaucoma, bilateral, mild stage: Secondary | ICD-10-CM | POA: Diagnosis not present

## 2016-11-24 DIAGNOSIS — H2513 Age-related nuclear cataract, bilateral: Secondary | ICD-10-CM | POA: Diagnosis not present

## 2016-12-24 DIAGNOSIS — Z23 Encounter for immunization: Secondary | ICD-10-CM | POA: Diagnosis not present

## 2016-12-26 ENCOUNTER — Ambulatory Visit (INDEPENDENT_AMBULATORY_CARE_PROVIDER_SITE_OTHER): Payer: Medicare Other | Admitting: Family Medicine

## 2016-12-26 ENCOUNTER — Encounter: Payer: Self-pay | Admitting: Family Medicine

## 2016-12-26 VITALS — BP 132/70 | HR 69 | Temp 98.1°F | Ht 65.5 in | Wt 165.2 lb

## 2016-12-26 DIAGNOSIS — E1151 Type 2 diabetes mellitus with diabetic peripheral angiopathy without gangrene: Secondary | ICD-10-CM | POA: Diagnosis not present

## 2016-12-26 DIAGNOSIS — I10 Essential (primary) hypertension: Secondary | ICD-10-CM | POA: Diagnosis not present

## 2016-12-26 DIAGNOSIS — E785 Hyperlipidemia, unspecified: Secondary | ICD-10-CM | POA: Diagnosis not present

## 2016-12-26 DIAGNOSIS — E1121 Type 2 diabetes mellitus with diabetic nephropathy: Secondary | ICD-10-CM

## 2016-12-26 DIAGNOSIS — E1165 Type 2 diabetes mellitus with hyperglycemia: Secondary | ICD-10-CM | POA: Diagnosis not present

## 2016-12-26 DIAGNOSIS — IMO0002 Reserved for concepts with insufficient information to code with codable children: Secondary | ICD-10-CM

## 2016-12-26 LAB — COMPREHENSIVE METABOLIC PANEL
ALBUMIN: 4.1 g/dL (ref 3.5–5.2)
ALT: 9 U/L (ref 0–53)
AST: 13 U/L (ref 0–37)
Alkaline Phosphatase: 57 U/L (ref 39–117)
BILIRUBIN TOTAL: 0.6 mg/dL (ref 0.2–1.2)
BUN: 12 mg/dL (ref 6–23)
CO2: 28 meq/L (ref 19–32)
CREATININE: 0.93 mg/dL (ref 0.40–1.50)
Calcium: 9.8 mg/dL (ref 8.4–10.5)
Chloride: 106 mEq/L (ref 96–112)
GFR: 100.46 mL/min (ref >60.00)
Glucose, Bld: 52 mg/dL — ABNORMAL LOW (ref 70–99)
Potassium: 4.3 mEq/L (ref 3.5–5.1)
SODIUM: 142 meq/L (ref 135–145)
Total Protein: 6.6 g/dL (ref 6.0–8.3)

## 2016-12-26 LAB — LIPID PANEL
Cholesterol: 121 mg/dL (ref 0–200)
HDL: 54 mg/dL (ref >39.00)
LDL Cholesterol: 49 mg/dL (ref 0–99)
NonHDL: 66.63
TRIGLYCERIDES: 87 mg/dL (ref 0.0–149.0)
Total CHOL/HDL Ratio: 2
VLDL: 17.4 mg/dL (ref 0.0–40.0)

## 2016-12-26 LAB — HEMOGLOBIN A1C: Hgb A1c MFr Bld: 6.1 % (ref 4.6–6.5)

## 2016-12-26 MED ORDER — GLUCOSE BLOOD VI STRP: ORAL_STRIP | 6 refills | Status: DC

## 2016-12-26 MED ORDER — FUROSEMIDE 20 MG PO TABS: 20.0000 mg | ORAL_TABLET | ORAL | 5 refills | Status: DC

## 2016-12-26 NOTE — Progress Notes (Signed)
Patient ID: [name] [name], male    DOB: July 22, 1934  Age: 82 y.o. MRN: [id number]    Subjective:  Subjective  HPI Ellwyn Ergle presents for f/u dm, cholesterol and bp.  No complaints.    Review of Systems  Constitutional: Negative for appetite change, diaphoresis, fatigue and unexpected weight change.  Eyes: Negative for pain, redness and visual disturbance.  Respiratory: Negative for cough, chest tightness, shortness of breath and wheezing.   Cardiovascular: Negative for chest pain, palpitations and leg swelling.  Endocrine: Negative for cold intolerance, heat intolerance, polydipsia, polyphagia and polyuria.  Genitourinary: Negative for difficulty urinating, dysuria and frequency.  Neurological: Negative for dizziness, light-headedness, numbness and headaches.    History Past Medical History:  Diagnosis Date  . Diabetes mellitus 1955   type 2 - fasting 100-120  . Diverticulosis 2009  . GERD (gastroesophageal reflux disease)   . Glaucoma    bilateral  . History of shingles   . Hyperlipidemia    5 yrs  . Hypertension   . Osteoarthritis    "knees" (01/31/2014)  . Prostate cancer ([place])    "no treatments; it's going away by itself w/the pills I take"    He has a past surgical history that includes Colonoscopy; Total knee arthroplasty (Right, 01/31/2014); Tonsillectomy; Total knee arthroplasty (Right, 01/31/2014); Joint replacement; Fine needle aspiration (Left, 06/10/15); Fine needle aspiration (05/26/15); and Anterior cervical decomp/discectomy fusion (N/A, 09/03/2015).   His family history includes Arthritis in his other; Cancer in his other; Diabetes in his other; Heart disease in his brother.He reports that he quit smoking about 21 years ago. His smoking use included Cigars. He quit after 2.00 years of use. He has never used smokeless tobacco. He reports that he does not drink alcohol or use drugs.  Current Outpatient Prescriptions on File Prior to Visit  Medication Sig Dispense  Refill  . acetaminophen-codeine (TYLENOL #3) 300-30 MG tablet Take 1 tablet by mouth as needed.  0  . atorvastatin (LIPITOR) 20 MG tablet Take 1 tablet (20 mg total) by mouth daily. 90 tablet 2  . Coenzyme Q10 (CO Q 10 PO) Take 50 mg by mouth daily.    . finasteride (PROSCAR) 5 MG tablet Take 5 mg by mouth daily.    Marland Kitchen gabapentin (NEURONTIN) 600 MG tablet take 1 tablet by mouth three times a day 90 tablet 5  . glipiZIDE (GLUCOTROL) 5 MG tablet Take 1 tablet (5 mg total) by mouth 2 (two) times daily. 180 tablet 1  . latanoprost (XALATAN) 0.005 % ophthalmic solution Place 1 drop into both eyes at bedtime.    . metFORMIN (GLUCOPHAGE) 1000 MG tablet Take 1 tablet (1,000 mg total) by mouth 2 (two) times daily with a meal. 180 tablet 2  . methocarbamol (ROBAXIN) 500 MG tablet Take 1 tablet by mouth daily.  0  . Omega-3 Fatty Acids (FISH OIL) 1000 MG CAPS Take 1 capsule by mouth 2 (two) times daily.    Marland Kitchen omeprazole (PRILOSEC) 20 MG capsule Take 1 capsule (20 mg total) by mouth daily. 90 capsule 2  . ramipril (ALTACE) 2.5 MG capsule Take 1 capsule (2.5 mg total) by mouth daily. 90 capsule 2  . traMADol (ULTRAM) 50 MG tablet Take 1 tablet by mouth daily.  0   No current facility-administered medications on file prior to visit.      Objective:  Objective  Physical Exam  Constitutional: He is oriented to person, place, and time. Vital signs are normal. He appears well-developed and well-nourished. He  is sleeping.  HENT:  Head: Normocephalic and atraumatic.  Mouth/Throat: Oropharynx is clear and moist.  Eyes: Pupils are equal, round, and reactive to light. EOM are normal.  Neck: Normal range of motion. Neck supple. No thyromegaly present.  Cardiovascular: Normal rate and regular rhythm.   No murmur heard. Pulmonary/Chest: Effort normal and breath sounds normal. No respiratory distress. He has no wheezes. He has no rales. He exhibits no tenderness.  Musculoskeletal: He exhibits no edema or  tenderness.  Neurological: He is alert and oriented to person, place, and time.  Skin: Skin is warm and dry.  Psychiatric: He has a normal mood and affect. His behavior is normal. Judgment and thought content normal.  Nursing note and vitals reviewed.  BP 132/70 (BP Location: Right Arm, Patient Position: Sitting, Cuff Size: Normal)   Pulse 69   Temp 98.1 F (36.7 C) (Oral)   Ht 5' 5.5" (1.664 m)   Wt 165 lb 3.2 oz (74.9 kg)   SpO2 99%   BMI 27.07 kg/m  Wt Readings from Last 3 Encounters:  12/26/16 165 lb 3.2 oz (74.9 kg)  06/13/16 170 lb 12.8 oz (77.5 kg)  09/03/15 167 lb 5.3 oz (75.9 kg)     Lab Results  Component Value Date   WBC 3.9 (L) 10/27/2016   HGB 12.1 (L) 10/27/2016   HCT 36.2 (L) 10/27/2016   PLT 278.0 10/27/2016   GLUCOSE 159 (H) 09/20/2016   CHOL 135 09/20/2016   TRIG 100.0 09/20/2016   HDL 48.60 09/20/2016   LDLCALC 66 09/20/2016   ALT 13 09/20/2016   AST 12 09/20/2016   NA 139 09/20/2016   K 4.2 09/20/2016   CL 104 09/20/2016   CREATININE 1.00 09/20/2016   BUN 19 09/20/2016   CO2 31 09/20/2016   TSH 1.89 08/17/2010   PSA 1.55 04/11/2014   INR 0.95 08/31/2015   HGBA1C 6.6 (H) 09/20/2016   MICROALBUR 0.9 06/30/2015    Dg Chest 2 View  Result Date: 08/31/2015 CLINICAL DATA:  Preoperative evaluation for cervical fusion EXAM: CHEST  2 VIEW COMPARISON:  01/31/2014 FINDINGS: Cardiac shadow is within normal limits. The lungs are well aerated bilaterally. No focal infiltrate or sizable effusion is seen. No acute bony abnormality is noted. IMPRESSION: No active cardiopulmonary disease. Electronically Signed   By: Inez Catalina M.D.   On: 08/31/2015 10:04     Assessment & Plan:  Plan  I am having Mr. Furukawa maintain his finasteride, latanoprost, Coenzyme Q10 (CO Q 10 PO), acetaminophen-codeine, traMADol, methocarbamol, gabapentin, Fish Oil, ramipril, omeprazole, metFORMIN, glipiZIDE, atorvastatin, furosemide, and glucose blood.  Meds ordered this encounter    Medications  . furosemide (LASIX) 20 MG tablet    Sig: Take 1 tablet (20 mg total) by mouth every other day.    Dispense:  30 tablet    Refill:  5  . glucose blood (ONETOUCH VERIO) test strip    Sig: Use twice daily to check blood sugar.  DX E11.9    Dispense:  100 each    Refill:  6    Problem List Items Addressed This Visit      Unprioritized   DM (diabetes mellitus) type II, controlled, with peripheral vascular disorder ([place]) - Primary    hgba1c to be checked, minimize simple carbs. Increase exercise as tolerated. Continue current meds      Relevant Medications   furosemide (LASIX) 20 MG tablet   glucose blood (ONETOUCH VERIO) test strip   Other Relevant Orders  Lipid panel   Hemoglobin A1c   Comprehensive metabolic panel   Essential hypertension    Well controlled, no changes to meds. Encouraged heart healthy diet such as the DASH diet and exercise as tolerated.        Relevant Medications   furosemide (LASIX) 20 MG tablet   Other Relevant Orders   Lipid panel   Hemoglobin A1c   Comprehensive metabolic panel   Hyperlipidemia LDL goal <70    Tolerating statin, encouraged heart healthy diet, avoid trans fats, minimize simple carbs and saturated fats. Increase exercise as tolerated      Relevant Medications   furosemide (LASIX) 20 MG tablet   Other Relevant Orders   Lipid panel   Hemoglobin A1c   Comprehensive metabolic panel      Follow-up: Return in about 6 months (around 06/25/2017) for hypertension, hyperlipidemia, diabetes II.  Ann Held, DO

## 2016-12-26 NOTE — Patient Instructions (Signed)

## 2016-12-26 NOTE — Assessment & Plan Note (Signed)
Well controlled, no changes to meds. Encouraged heart healthy diet such as the DASH diet and exercise as tolerated.  °

## 2016-12-26 NOTE — Assessment & Plan Note (Signed)
Tolerating statin, encouraged heart healthy diet, avoid trans fats, minimize simple carbs and saturated fats. Increase exercise as tolerated 

## 2016-12-26 NOTE — Assessment & Plan Note (Signed)
hgba1c to be checked, minimize simple carbs. Increase exercise as tolerated. Continue current meds  

## 2016-12-28 ENCOUNTER — Other Ambulatory Visit: Payer: Self-pay

## 2016-12-28 DIAGNOSIS — I10 Essential (primary) hypertension: Secondary | ICD-10-CM

## 2016-12-28 MED ORDER — FUROSEMIDE 20 MG PO TABS: 20.0000 mg | ORAL_TABLET | ORAL | 5 refills | Status: DC

## 2017-01-03 ENCOUNTER — Other Ambulatory Visit: Payer: Self-pay | Admitting: Family Medicine

## 2017-01-03 DIAGNOSIS — I10 Essential (primary) hypertension: Secondary | ICD-10-CM

## 2017-01-03 MED ORDER — FUROSEMIDE 20 MG PO TABS: 20.0000 mg | ORAL_TABLET | ORAL | 3 refills | Status: DC

## 2017-02-13 ENCOUNTER — Other Ambulatory Visit: Payer: Self-pay | Admitting: Family Medicine

## 2017-02-13 DIAGNOSIS — E1165 Type 2 diabetes mellitus with hyperglycemia: Principal | ICD-10-CM

## 2017-02-13 DIAGNOSIS — IMO0002 Reserved for concepts with insufficient information to code with codable children: Secondary | ICD-10-CM

## 2017-02-13 DIAGNOSIS — E1151 Type 2 diabetes mellitus with diabetic peripheral angiopathy without gangrene: Secondary | ICD-10-CM

## 2017-02-17 IMAGING — CR DG CHEST 2V
2 series · 2 of 2 positions shown · non-contrast
Comparison: 01/31/2014

CLINICAL DATA: Preoperative evaluation for cervical fusion

EXAM:
CHEST  2 VIEW

[w chest pa]
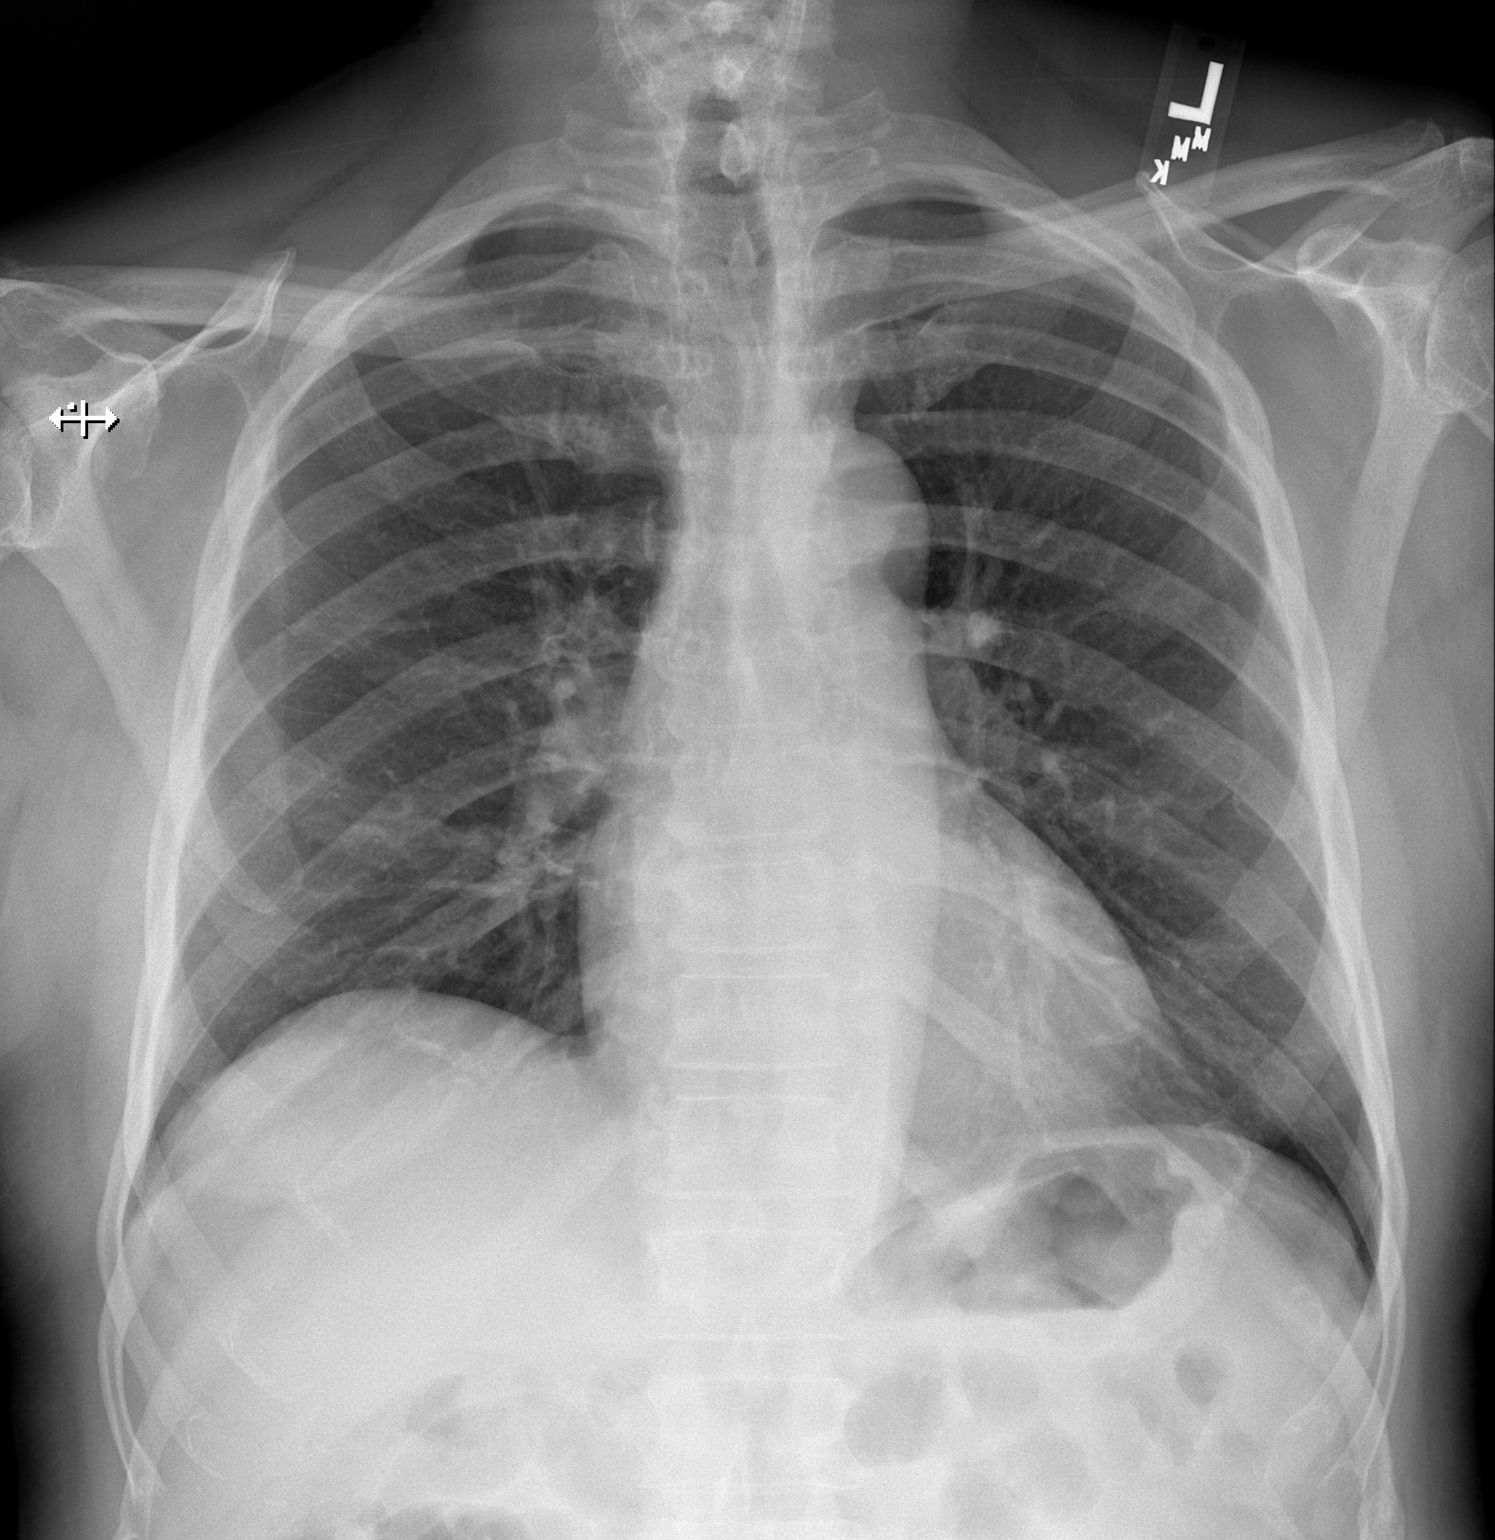

[w chest lat]
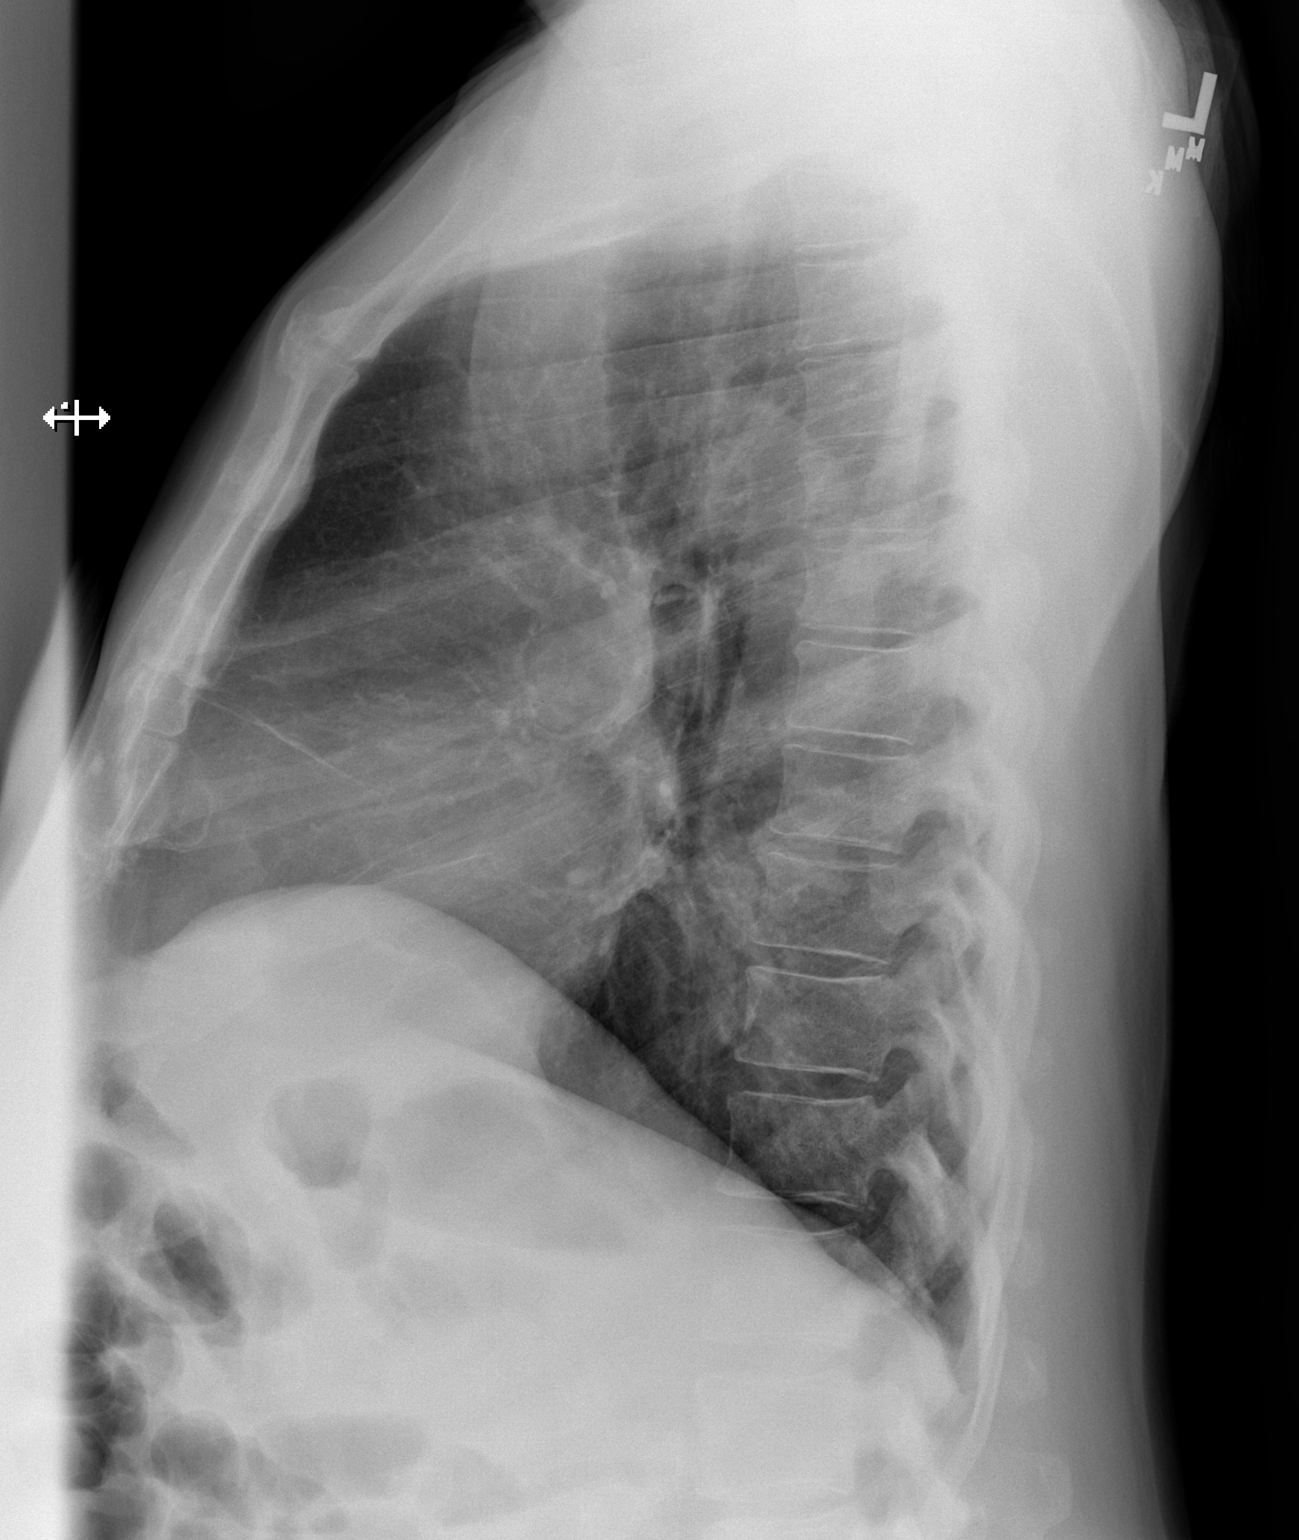

[2 of 2 positions shown; findings below may reference images not displayed]

FINDINGS: Cardiac shadow is within normal limits. The lungs are well aerated
bilaterally. No focal infiltrate or sizable effusion is seen. No
acute bony abnormality is noted.
IMPRESSION: No active cardiopulmonary disease.

## 2017-02-20 IMAGING — RF DG CERVICAL SPINE 1V
1 series · 1 of 1 positions shown · non-contrast
Comparison: None.

CLINICAL DATA: ACDF C5/C6 6 seconds fluoro Radiation safety
performed by [facility].

EXAM:
CERVICAL SPINE 1 VIEW

[Series 1: run · 1 of 1 slices shown]
[im 1/1]
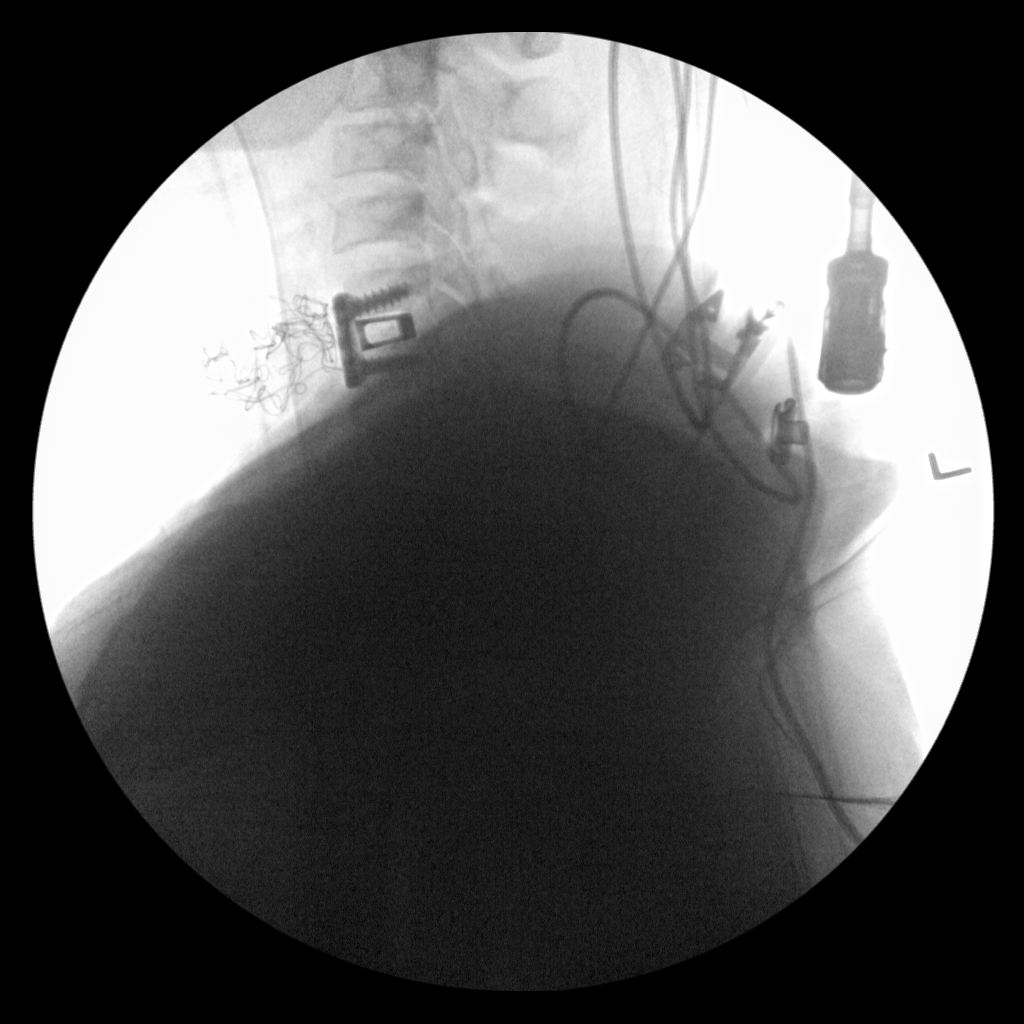

[1 of 1 positions shown; findings below may reference images not displayed]

FINDINGS: Patient has undergone anterior fusion at C5-6 with interbody fusion
device. Radiopaque sponge identified anterior to the fusion level.
Endotracheal tube in place.
IMPRESSION: Anterior fusion C5-6.

## 2017-02-21 DIAGNOSIS — M47812 Spondylosis without myelopathy or radiculopathy, cervical region: Secondary | ICD-10-CM | POA: Diagnosis not present

## 2017-02-22 DIAGNOSIS — C61 Malignant neoplasm of prostate: Secondary | ICD-10-CM | POA: Diagnosis not present

## 2017-03-01 DIAGNOSIS — N400 Enlarged prostate without lower urinary tract symptoms: Secondary | ICD-10-CM | POA: Diagnosis not present

## 2017-03-01 DIAGNOSIS — C61 Malignant neoplasm of prostate: Secondary | ICD-10-CM | POA: Diagnosis not present

## 2017-03-08 DIAGNOSIS — M47812 Spondylosis without myelopathy or radiculopathy, cervical region: Secondary | ICD-10-CM | POA: Diagnosis not present

## 2017-05-09 ENCOUNTER — Other Ambulatory Visit: Payer: Self-pay | Admitting: *Deleted

## 2017-05-09 DIAGNOSIS — E1151 Type 2 diabetes mellitus with diabetic peripheral angiopathy without gangrene: Secondary | ICD-10-CM

## 2017-05-09 MED ORDER — GLUCOSE BLOOD VI STRP: ORAL_STRIP | 1 refills | Status: DC

## 2017-05-29 ENCOUNTER — Other Ambulatory Visit: Payer: Self-pay | Admitting: Family Medicine

## 2017-06-01 DIAGNOSIS — H401131 Primary open-angle glaucoma, bilateral, mild stage: Secondary | ICD-10-CM | POA: Diagnosis not present

## 2017-06-01 DIAGNOSIS — H2513 Age-related nuclear cataract, bilateral: Secondary | ICD-10-CM | POA: Diagnosis not present

## 2017-06-01 DIAGNOSIS — E119 Type 2 diabetes mellitus without complications: Secondary | ICD-10-CM | POA: Diagnosis not present

## 2017-06-07 ENCOUNTER — Other Ambulatory Visit: Payer: Self-pay | Admitting: *Deleted

## 2017-06-07 DIAGNOSIS — E1151 Type 2 diabetes mellitus with diabetic peripheral angiopathy without gangrene: Secondary | ICD-10-CM

## 2017-06-07 MED ORDER — GLUCOSE BLOOD VI STRP: ORAL_STRIP | 1 refills | Status: DC

## 2017-08-22 ENCOUNTER — Ambulatory Visit (INDEPENDENT_AMBULATORY_CARE_PROVIDER_SITE_OTHER): Payer: Medicare Other | Admitting: Family Medicine

## 2017-08-22 ENCOUNTER — Encounter: Payer: Self-pay | Admitting: Family Medicine

## 2017-08-22 VITALS — BP 130/66 | HR 61 | Temp 98.4°F | Resp 16 | Ht 66.0 in | Wt 164.6 lb

## 2017-08-22 DIAGNOSIS — E785 Hyperlipidemia, unspecified: Secondary | ICD-10-CM | POA: Diagnosis not present

## 2017-08-22 DIAGNOSIS — E1165 Type 2 diabetes mellitus with hyperglycemia: Secondary | ICD-10-CM | POA: Diagnosis not present

## 2017-08-22 DIAGNOSIS — E1149 Type 2 diabetes mellitus with other diabetic neurological complication: Secondary | ICD-10-CM

## 2017-08-22 DIAGNOSIS — E1169 Type 2 diabetes mellitus with other specified complication: Secondary | ICD-10-CM

## 2017-08-22 DIAGNOSIS — I10 Essential (primary) hypertension: Secondary | ICD-10-CM | POA: Diagnosis not present

## 2017-08-22 DIAGNOSIS — E1151 Type 2 diabetes mellitus with diabetic peripheral angiopathy without gangrene: Secondary | ICD-10-CM | POA: Diagnosis not present

## 2017-08-22 DIAGNOSIS — IMO0002 Reserved for concepts with insufficient information to code with codable children: Secondary | ICD-10-CM

## 2017-08-22 DIAGNOSIS — H409 Unspecified glaucoma: Secondary | ICD-10-CM | POA: Diagnosis not present

## 2017-08-22 LAB — COMPREHENSIVE METABOLIC PANEL
ALT: 10 U/L (ref 0–53)
AST: 10 U/L (ref 0–37)
Albumin: 3.9 g/dL (ref 3.5–5.2)
Alkaline Phosphatase: 67 U/L (ref 39–117)
BUN: 11 mg/dL (ref 6–23)
CALCIUM: 8.8 mg/dL (ref 8.4–10.5)
CHLORIDE: 106 meq/L (ref 96–112)
CO2: 28 meq/L (ref 19–32)
Creatinine, Ser: 0.92 mg/dL (ref 0.40–1.50)
GFR: 101.56 mL/min (ref >60.00)
GLUCOSE: 108 mg/dL — AB (ref 70–99)
POTASSIUM: 4.3 meq/L (ref 3.5–5.1)
Sodium: 145 mEq/L (ref 135–145)
Total Bilirubin: 0.5 mg/dL (ref 0.2–1.2)
Total Protein: 6.3 g/dL (ref 6.0–8.3)

## 2017-08-22 LAB — LIPID PANEL
CHOL/HDL RATIO: 3
Cholesterol: 137 mg/dL (ref 0–200)
HDL: 48.2 mg/dL (ref >39.00)
LDL CALC: 71 mg/dL (ref 0–99)
NONHDL: 89.26
Triglycerides: 91 mg/dL (ref 0.0–149.0)
VLDL: 18.2 mg/dL (ref 0.0–40.0)

## 2017-08-22 LAB — HEMOGLOBIN A1C: HEMOGLOBIN A1C: 6.6 % — AB (ref 4.6–6.5)

## 2017-08-22 MED ORDER — GABAPENTIN 600 MG PO TABS: 600.0000 mg | ORAL_TABLET | Freq: Three times a day (TID) | ORAL | 5 refills | Status: DC

## 2017-08-22 NOTE — Assessment & Plan Note (Signed)
Well controlled, no changes to meds. Encouraged heart healthy diet such as the DASH diet and exercise as tolerated.  °

## 2017-08-22 NOTE — Patient Instructions (Signed)
Diabetes Mellitus and Standards of Medical Care Managing diabetes (diabetes mellitus) can be complicated. Your diabetes treatment may be managed by a team of health care providers, including:  A diet and nutrition specialist (registered dietitian).  A nurse.  A certified diabetes educator (CDE).  A diabetes specialist (endocrinologist).  An eye doctor.  A primary care provider.  A dentist.  Your health care providers follow a schedule in order to help you get the best quality of care. The following schedule is a general guideline for your diabetes management plan. Your health care providers may also give you more specific instructions. HbA1c ( hemoglobin A1c) test This test provides information about blood sugar (glucose) control over the previous 2-3 months. It is used to check whether your diabetes management plan needs to be adjusted.  If you are meeting your treatment goals, this test is done at least 2 times a year.  If you are not meeting treatment goals or if your treatment goals have changed, this test is done 4 times a year.  Blood pressure test  This test is done at every routine medical visit. For most people, the goal is less than 130/80. Ask your health care provider what your goal blood pressure should be. Dental and eye exams  Visit your dentist two times a year.  If you have type 1 diabetes, get an eye exam 3-5 years after you are diagnosed, and then once a year after your first exam. ? If you were diagnosed with type 1 diabetes as a child, get an eye exam when you are age 16 or older and have had diabetes for 3-5 years. After the first exam, you should get an eye exam once a year.  If you have type 2 diabetes, have an eye exam as soon as you are diagnosed, and then once a year after your first exam. Foot care exam  Visual foot exams are done at every routine medical visit. The exams check for cuts, bruises, redness, blisters, sores, or other problems with the  feet.  A complete foot exam is done by your health care provider once a year. This exam includes an inspection of the structure and skin of your feet, and a check of the pulses and sensation in your feet. ? Type 1 diabetes: Get your first exam 3-5 years after diagnosis. ? Type 2 diabetes: Get your first exam as soon as you are diagnosed.  Check your feet every day for cuts, bruises, redness, blisters, or sores. If you have any of these or other problems that are not healing, contact your health care provider. Kidney function test ( urine microalbumin)  This test is done once a year. ? Type 1 diabetes: Get your first test 5 years after diagnosis. ? Type 2 diabetes: Get your first test as soon as you are diagnosed.  If you have chronic kidney disease (CKD), get a serum creatinine and estimated glomerular filtration rate (eGFR) test once a year. Lipid profile (cholesterol, HDL, LDL, triglycerides)  This test should be done when you are diagnosed with diabetes, and every 5 years after the first test. If you are on medicines to lower your cholesterol, you may need to get this test done every year. ? The goal for LDL is less than 100 mg/dL (5.5 mmol/L). If you are at high risk, the goal is less than 70 mg/dL (3.9 mmol/L). ? The goal for HDL is 40 mg/dL (2.2 mmol/L) for men and 50 mg/dL(2.8 mmol/L) for women. An HDL  cholesterol of 60 mg/dL (3.3 mmol/L) or higher gives some protection against heart disease. ? The goal for triglycerides is less than 150 mg/dL (8.3 mmol/L). Immunizations  The yearly flu (influenza) vaccine is recommended for everyone 6 months or older who has diabetes.  The pneumonia (pneumococcal) vaccine is recommended for everyone 2 years or older who has diabetes. If you are 97 or older, you may get the pneumonia vaccine as a series of two separate shots.  The hepatitis B vaccine is recommended for adults shortly after they have been diagnosed with diabetes.  The Tdap  (tetanus, diphtheria, and pertussis) vaccine should be given: ? According to normal childhood vaccination schedules, for children. ? Every 10 years, for adults who have diabetes.  The shingles vaccine is recommended for people who have had chicken pox and are 50 years or older. Mental and emotional health  Screening for symptoms of eating disorders, anxiety, and depression is recommended at the time of diagnosis and afterward as needed. If your screening shows that you have symptoms (you have a positive screening result), you may need further evaluation and be referred to a mental health care provider. Diabetes self-management education  Education about how to manage your diabetes is recommended at diagnosis and ongoing as needed. Treatment plan  Your treatment plan will be reviewed at every medical visit. Summary  Managing diabetes (diabetes mellitus) can be complicated. Your diabetes treatment may be managed by a team of health care providers.  Your health care providers follow a schedule in order to help you get the best quality of care.  Standards of care including having regular physical exams, blood tests, blood pressure monitoring, immunizations, screening tests, and education about how to manage your diabetes.  Your health care providers may also give you more specific instructions based on your individual health. This information is not intended to replace advice given to you by your health care provider. Make sure you discuss any questions you have with your health care provider. Document Released: 01/23/2009 Document Revised: 12/25/2015 Document Reviewed: 12/25/2015 Elsevier Interactive Patient Education  [employer].

## 2017-08-22 NOTE — Assessment & Plan Note (Signed)
hgba1c to be checked, minimize simple carbs. Increase exercise as tolerated. Continue current meds  

## 2017-08-22 NOTE — Assessment & Plan Note (Signed)
Tolerating statin, encouraged heart healthy diet, avoid trans fats, minimize simple carbs and saturated fats. Increase exercise as tolerated 

## 2017-08-22 NOTE — Progress Notes (Signed)
Patient ID: [name] [name], male   DOB: 01-04-1935, 83 y.o.   MRN: [id number]    Subjective:  I acted as a Education administrator for Dr. Carollee Herter.  [name] [name], [place]   Patient ID: [name] [name], male    DOB: 04/28/1934, 83 y.o.   MRN: [id number]  Chief Complaint  Patient presents with  . Diabetes  . Hypertension  . Hyperlipidemia    HPI Patient is in today for follow up diabetes, blood pressure, and cholesterol.  HYPERTENSION   Blood pressure range-133/60 range  Chest pain- no      Dyspnea- no Lightheadedness- no   Edema- no  Other side effects - no   Medication compliance: good Low salt diet- yes    DIABETES    Blood Sugar ranges-108-120s  Polyuria- no New Visual problems- no  Hypoglycemic symptoms- no  Other side effects-no Medication compliance - good Last eye exam- 04/2017 Foot exam- today    HYPERLIPIDEMIA  Medication compliance- good RUQ pain- no  Muscle aches- no Other side effects-no  Patient Care Team: Ann Held, DO as PCP - General (Family Medicine) Festus Aloe, MD as Consulting Physician (Urology) Marylynn Pearson, MD as Consulting Physician (Ophthalmology) Frederik Pear, MD as Consulting Physician (Orthopedic Surgery) Neldon Newport as Physician Assistant (Orthopedic Surgery) Normajean Glasgow, MD as Attending Physician (Physical Medicine and Rehabilitation) Phylliss Bob, MD as Consulting Physician (Orthopedic Surgery)   Past Medical History:  Diagnosis Date  . Diabetes mellitus 1955   type 2 - fasting 100-120  . Diverticulosis 2009  . GERD (gastroesophageal reflux disease)   . Glaucoma    bilateral  . History of shingles   . Hyperlipidemia    5 yrs  . Hypertension   . Osteoarthritis    "knees" (01/31/2014)  . Prostate cancer ([place])    "no treatments; it's going away by itself w/the pills I take"    Past Surgical History:  Procedure Laterality Date  . ANTERIOR CERVICAL DECOMP/DISCECTOMY FUSION N/A 09/03/2015   Procedure: ANTERIOR CERVICAL  DECOMPRESSION FUSION, CERVICAL 5-6 WITH INSTRUMENTATION AND ALLOGRAFT;  Surgeon: Phylliss Bob, MD;  Location: [facility];  Service: Orthopedics;  Laterality: N/A;  ANTERIOR CERVICAL DECOMPRESSION FUSION, CERVICAL 5-6 WITH INSTRUMENTATION AND ALLOGRAFT  . COLONOSCOPY    . FINE NEEDLE ASPIRATION Left 06/10/15   Knee  . FINE NEEDLE ASPIRATION  05/26/15   Back  . JOINT REPLACEMENT    . TONSILLECTOMY    . TOTAL KNEE ARTHROPLASTY Right 01/31/2014  . TOTAL KNEE ARTHROPLASTY Right 01/31/2014   Procedure: RIGHT TOTAL KNEE ARTHROPLASTY;  Surgeon: Kerin Salen, MD;  Location: [facility];  Service: Orthopedics;  Laterality: Right;    Family History  Problem Relation Age of Onset  . Arthritis Other   . Diabetes Other   . Cancer Other        prostate  . Heart disease Brother        questionable    Social History   Socioeconomic History  . Marital status: Widowed    Spouse name: Not on file  . Number of children: Not on file  . Years of education: Not on file  . Highest education level: Not on file  Occupational History    Comment: retired Biomedical scientist     Comment: Restaurant manager, fast food  Social Needs  . Financial resource strain: Not on file  . Food insecurity:    Worry: Not on file    Inability: Not on file  . Transportation needs:    Medical: Not on  file    Non-medical: Not on file  Tobacco Use  . Smoking status: Former Smoker    Years: 2.00    Types: Cigars    Last attempt to quit: 04/12/1995    Years since quitting: 22.3  . Smokeless tobacco: Never Used  Substance and Sexual Activity  . Alcohol use: No  . Drug use: No  . Sexual activity: Never  Lifestyle  . Physical activity:    Days per week: Not on file    Minutes per session: Not on file  . Stress: Not on file  Relationships  . Social connections:    Talks on phone: Not on file    Gets together: Not on file    Attends religious service: Not on file    Active member of club or organization: Not on file    Attends meetings of clubs or  organizations: Not on file    Relationship status: Not on file  . Intimate partner violence:    Fear of current or ex partner: Not on file    Emotionally abused: Not on file    Physically abused: Not on file    Forced sexual activity: Not on file  Other Topics Concern  . Not on file  Social History Narrative   Works out in the garden    Outpatient Medications Prior to Visit  Medication Sig Dispense Refill  . acetaminophen-codeine (TYLENOL #3) 300-30 MG tablet Take 1 tablet by mouth as needed.  0  . atorvastatin (LIPITOR) 20 MG tablet TAKE 1 TABLET BY MOUTH DAILY 90 tablet 2  . Coenzyme Q10 (CO Q 10 PO) Take 50 mg by mouth daily.    . finasteride (PROSCAR) 5 MG tablet Take 5 mg by mouth daily.    . furosemide (LASIX) 20 MG tablet Take 1 tablet (20 mg total) by mouth every other day. 90 tablet 3  . glipiZIDE (GLUCOTROL) 5 MG tablet TAKE 1 TABLET BY MOUTH TWICE DAILY 180 tablet 1  . glucose blood (ONETOUCH VERIO) test strip Use twice daily to check blood sugar.  DX E11.9 100 each 1  . latanoprost (XALATAN) 0.005 % ophthalmic solution Place 1 drop into both eyes at bedtime.    . metFORMIN (GLUCOPHAGE) 1000 MG tablet Take 1 tablet (1,000 mg total) by mouth 2 (two) times daily with a meal. 180 tablet 2  . methocarbamol (ROBAXIN) 500 MG tablet Take 1 tablet by mouth daily.  0  . Omega-3 Fatty Acids (FISH OIL) 1000 MG CAPS Take 1 capsule by mouth 2 (two) times daily.    Marland Kitchen omeprazole (PRILOSEC) 20 MG capsule TAKE 1 CAPSULE BY MOUTH DAILY 90 capsule 2  . ramipril (ALTACE) 2.5 MG capsule TAKE 1 CAPSULE BY MOUTH DAILY 90 capsule 2  . traMADol (ULTRAM) 50 MG tablet Take 1 tablet by mouth daily.  0  . gabapentin (NEURONTIN) 600 MG tablet take 1 tablet by mouth three times a day 90 tablet 5   No facility-administered medications prior to visit.     No Known Allergies  Review of Systems  Constitutional: Negative for fever and malaise/fatigue.  HENT: Negative for congestion.   Eyes: Negative  for blurred vision.  Respiratory: Negative for cough and shortness of breath.   Cardiovascular: Negative for chest pain, palpitations and leg swelling.  Gastrointestinal: Negative for vomiting.  Musculoskeletal: Negative for back pain.  Skin: Negative for rash.  Neurological: Negative for loss of consciousness and headaches.       Objective:  Physical Exam  Constitutional: He is oriented to person, place, and time. Vital signs are normal. He appears well-developed and well-nourished. He is sleeping.  HENT:  Head: Normocephalic and atraumatic.  Mouth/Throat: Oropharynx is clear and moist.  Eyes: Pupils are equal, round, and reactive to light. EOM are normal.  Neck: Normal range of motion. Neck supple. No thyromegaly present.  Cardiovascular: Normal rate and regular rhythm.  No murmur heard. Pulmonary/Chest: Effort normal and breath sounds normal. No respiratory distress. He has no wheezes. He has no rales. He exhibits no tenderness.  Musculoskeletal: He exhibits no edema or tenderness.  Neurological: He is alert and oriented to person, place, and time.  Skin: Skin is warm and dry.  Psychiatric: He has a normal mood and affect. His behavior is normal. Judgment and thought content normal.  Nursing note and vitals reviewed.   BP 130/66 (BP Location: Left Arm, Cuff Size: Normal)   Pulse 61   Temp 98.4 F (36.9 C) (Oral)   Resp 16   Ht 5\' 6"  (1.676 m)   Wt 164 lb 9.6 oz (74.7 kg)   SpO2 96%   BMI 26.57 kg/m  Wt Readings from Last 3 Encounters:  08/22/17 164 lb 9.6 oz (74.7 kg)  12/26/16 165 lb 3.2 oz (74.9 kg)  06/13/16 170 lb 12.8 oz (77.5 kg)   BP Readings from Last 3 Encounters:  08/22/17 130/66  12/26/16 132/70  06/13/16 138/78     Immunization History  Administered Date(s) Administered  . Influenza Split 12/21/2011  . Influenza Whole 12/19/2007, 01/16/2009, 01/27/2010  . Influenza, High Dose Seasonal PF 12/15/2014  . Influenza-Unspecified 01/01/2014,  12/24/2016  . Pneumococcal Conjugate-13 05/01/2014, 09/16/2014  . Pneumococcal Polysaccharide-23 12/19/2007  . Td 10/16/2007    Health Maintenance  Topic Date Due  . HEMOGLOBIN A1C  06/25/2017  . TETANUS/TDAP  10/15/2017  . INFLUENZA VACCINE  11/09/2017  . OPHTHALMOLOGY EXAM  05/02/2018  . FOOT EXAM  08/23/2018  . PNA vac Low Risk Adult  Completed    Lab Results  Component Value Date   WBC 3.9 (L) 10/27/2016   HGB 12.1 (L) 10/27/2016   HCT 36.2 (L) 10/27/2016   PLT 278.0 10/27/2016   GLUCOSE 52 (L) 12/26/2016   CHOL 121 12/26/2016   TRIG 87.0 12/26/2016   HDL 54.00 12/26/2016   LDLCALC 49 12/26/2016   ALT 9 12/26/2016   AST 13 12/26/2016   NA 142 12/26/2016   K 4.3 12/26/2016   CL 106 12/26/2016   CREATININE 0.93 12/26/2016   BUN 12 12/26/2016   CO2 28 12/26/2016   TSH 1.89 08/17/2010   PSA 1.55 04/11/2014   INR 0.95 08/31/2015   HGBA1C 6.1 12/26/2016   MICROALBUR 0.9 06/30/2015    Lab Results  Component Value Date   TSH 1.89 08/17/2010   Lab Results  Component Value Date   WBC 3.9 (L) 10/27/2016   HGB 12.1 (L) 10/27/2016   HCT 36.2 (L) 10/27/2016   MCV 97.7 10/27/2016   PLT 278.0 10/27/2016   Lab Results  Component Value Date   NA 142 12/26/2016   K 4.3 12/26/2016   CO2 28 12/26/2016   GLUCOSE 52 (L) 12/26/2016   BUN 12 12/26/2016   CREATININE 0.93 12/26/2016   BILITOT 0.6 12/26/2016   ALKPHOS 57 12/26/2016   AST 13 12/26/2016   ALT 9 12/26/2016   PROT 6.6 12/26/2016   ALBUMIN 4.1 12/26/2016   CALCIUM 9.8 12/26/2016   ANIONGAP 5 08/31/2015   GFR 100.46  12/26/2016   Lab Results  Component Value Date   CHOL 121 12/26/2016   Lab Results  Component Value Date   HDL 54.00 12/26/2016   Lab Results  Component Value Date   LDLCALC 49 12/26/2016   Lab Results  Component Value Date   TRIG 87.0 12/26/2016   Lab Results  Component Value Date   CHOLHDL 2 12/26/2016   Lab Results  Component Value Date   HGBA1C 6.1 12/26/2016          Assessment & Plan:   Problem List Items Addressed This Visit      Unprioritized   DM (diabetes mellitus) type II, controlled, with peripheral vascular disorder ([place])    hgba1c to be checked , minimize simple carbs. Increase exercise as tolerated. Continue current meds       Essential hypertension    Well controlled, no changes to meds. Encouraged heart healthy diet such as the DASH diet and exercise as tolerated.       Relevant Orders   Lipid panel   Hemoglobin A1c   Comprehensive metabolic panel   Glaucoma   Hyperlipidemia LDL goal <70    Tolerating statin, encouraged heart healthy diet, avoid trans fats, minimize simple carbs and saturated fats. Increase exercise as tolerated       Other Visit Diagnoses    Hyperlipidemia associated with type 2 diabetes mellitus ([place])    -  Primary   Relevant Orders   Lipid panel   Hemoglobin A1c   Comprehensive metabolic panel   DM (diabetes mellitus) type II uncontrolled, periph vascular disorder ([facility])       Relevant Orders   Hemoglobin A1c   Comprehensive metabolic panel   Other diabetic neurological complication associated with type 2 diabetes mellitus ([place])       Relevant Medications   gabapentin (NEURONTIN) 600 MG tablet      I have changed Dhyan Pricer's gabapentin. I am also having him maintain his finasteride, latanoprost, Coenzyme Q10 (CO Q 10 PO), acetaminophen-codeine, traMADol, methocarbamol, Fish Oil, metFORMIN, furosemide, glipiZIDE, omeprazole, ramipril, atorvastatin, and glucose blood.  Meds ordered this encounter  Medications  . gabapentin (NEURONTIN) 600 MG tablet    Sig: Take 1 tablet (600 mg total) by mouth 3 (three) times daily.    Dispense:  90 tablet    Refill:  5    CMA served as scribe during this visit. History, Physical and Plan performed by medical provider. Documentation and orders reviewed and attested to.  Ann Held, DO

## 2017-08-23 DIAGNOSIS — C61 Malignant neoplasm of prostate: Secondary | ICD-10-CM | POA: Diagnosis not present

## 2017-08-30 ENCOUNTER — Other Ambulatory Visit: Payer: Self-pay | Admitting: Family Medicine

## 2017-09-01 ENCOUNTER — Other Ambulatory Visit: Payer: Self-pay | Admitting: Family Medicine

## 2017-09-01 DIAGNOSIS — E1165 Type 2 diabetes mellitus with hyperglycemia: Principal | ICD-10-CM

## 2017-09-01 DIAGNOSIS — IMO0002 Reserved for concepts with insufficient information to code with codable children: Secondary | ICD-10-CM

## 2017-09-01 DIAGNOSIS — E1151 Type 2 diabetes mellitus with diabetic peripheral angiopathy without gangrene: Secondary | ICD-10-CM

## 2017-09-05 ENCOUNTER — Other Ambulatory Visit: Payer: Self-pay | Admitting: Family Medicine

## 2017-09-05 DIAGNOSIS — E1149 Type 2 diabetes mellitus with other diabetic neurological complication: Secondary | ICD-10-CM

## 2017-10-03 DIAGNOSIS — H2513 Age-related nuclear cataract, bilateral: Secondary | ICD-10-CM | POA: Diagnosis not present

## 2017-10-03 DIAGNOSIS — H401131 Primary open-angle glaucoma, bilateral, mild stage: Secondary | ICD-10-CM | POA: Diagnosis not present

## 2017-10-03 DIAGNOSIS — E119 Type 2 diabetes mellitus without complications: Secondary | ICD-10-CM | POA: Diagnosis not present

## 2017-11-28 ENCOUNTER — Ambulatory Visit (INDEPENDENT_AMBULATORY_CARE_PROVIDER_SITE_OTHER): Payer: Medicare Other | Admitting: Family Medicine

## 2017-11-28 ENCOUNTER — Encounter: Payer: Self-pay | Admitting: Family Medicine

## 2017-11-28 VITALS — BP 119/52 | HR 70 | Temp 98.5°F | Resp 16 | Ht 66.0 in | Wt 167.6 lb

## 2017-11-28 DIAGNOSIS — E1151 Type 2 diabetes mellitus with diabetic peripheral angiopathy without gangrene: Secondary | ICD-10-CM | POA: Diagnosis not present

## 2017-11-28 DIAGNOSIS — I10 Essential (primary) hypertension: Secondary | ICD-10-CM | POA: Diagnosis not present

## 2017-11-28 DIAGNOSIS — E785 Hyperlipidemia, unspecified: Secondary | ICD-10-CM

## 2017-11-28 DIAGNOSIS — E1165 Type 2 diabetes mellitus with hyperglycemia: Secondary | ICD-10-CM

## 2017-11-28 DIAGNOSIS — E1169 Type 2 diabetes mellitus with other specified complication: Secondary | ICD-10-CM

## 2017-11-28 DIAGNOSIS — IMO0002 Reserved for concepts with insufficient information to code with codable children: Secondary | ICD-10-CM

## 2017-11-28 LAB — LIPID PANEL
CHOLESTEROL: 136 mg/dL (ref 0–200)
HDL: 45.6 mg/dL (ref >39.00)
LDL Cholesterol: 69 mg/dL (ref 0–99)
NonHDL: 90.09
Total CHOL/HDL Ratio: 3
Triglycerides: 106 mg/dL (ref 0.0–149.0)
VLDL: 21.2 mg/dL (ref 0.0–40.0)

## 2017-11-28 LAB — COMPREHENSIVE METABOLIC PANEL
ALBUMIN: 4.1 g/dL (ref 3.5–5.2)
ALT: 11 U/L (ref 0–53)
AST: 12 U/L (ref 0–37)
Alkaline Phosphatase: 75 U/L (ref 39–117)
BUN: 14 mg/dL (ref 6–23)
CHLORIDE: 104 meq/L (ref 96–112)
CO2: 30 mEq/L (ref 19–32)
Calcium: 9.7 mg/dL (ref 8.4–10.5)
Creatinine, Ser: 1.14 mg/dL (ref 0.40–1.50)
GFR: 79.24 mL/min (ref >60.00)
GLUCOSE: 190 mg/dL — AB (ref 70–99)
POTASSIUM: 4.9 meq/L (ref 3.5–5.1)
SODIUM: 139 meq/L (ref 135–145)
Total Bilirubin: 0.5 mg/dL (ref 0.2–1.2)
Total Protein: 6.4 g/dL (ref 6.0–8.3)

## 2017-11-28 LAB — HEMOGLOBIN A1C: HEMOGLOBIN A1C: 9 % — AB (ref 4.6–6.5)

## 2017-11-28 NOTE — Assessment & Plan Note (Signed)
hgba1c to be checked, minimize simple carbs. Increase exercise as tolerated. Continue current meds  

## 2017-11-28 NOTE — Progress Notes (Signed)
Patient ID: [name] [name], male   DOB: 1935-01-09, 83 y.o.   MRN: [id number]     Subjective:  I acted as a Education administrator for Dr. Carollee Herter.  [name] [name], [name] [name]   Patient ID: [name] [name], male    DOB: 28-Jun-1934, 83 y.o.   MRN: [id number]  Chief Complaint  Patient presents with  . Hypertension  . Hyperlipidemia  . Diabetes    HPI Patient is in today for follow up blood pressure, cholesterol, and diabetes.Marland Kitchen  HYPERTENSION   Blood pressure range-not checking   Chest pain- no      Dyspnea- no Lightheadedness- no   Edema- no  Other side effects - no   Medication compliance: good Low salt diet- yes    DIABETES    Blood Sugar ranges-172 this am, fasting ----  Polyuria- no New Visual problems- no  Hypoglycemic symptoms- no  Other side effects-no Medication compliance - good Last eye exam- 3 mont ago--- next 9/25 Foot exam- today   HYPERLIPIDEMIA  Medication compliance- no RUQ pain- no  Muscle aches- no Other side effects-no      Past Medical History:  Diagnosis Date  . Diabetes mellitus 1955   type 2 - fasting 100-120  . Diverticulosis 2009  . GERD (gastroesophageal reflux disease)   . Glaucoma    bilateral  . History of shingles   . Hyperlipidemia    5 yrs  . Hypertension   . Osteoarthritis    "knees" (01/31/2014)  . Prostate cancer ([place])    "no treatments; it's going away by itself w/the pills I take"    Past Surgical History:  Procedure Laterality Date  . ANTERIOR CERVICAL DECOMP/DISCECTOMY FUSION N/A 09/03/2015   Procedure: ANTERIOR CERVICAL DECOMPRESSION FUSION, CERVICAL 5-6 WITH INSTRUMENTATION AND ALLOGRAFT;  Surgeon: Phylliss Bob, MD;  Location: [facility];  Service: Orthopedics;  Laterality: N/A;  ANTERIOR CERVICAL DECOMPRESSION FUSION, CERVICAL 5-6 WITH INSTRUMENTATION AND ALLOGRAFT  . COLONOSCOPY    . FINE NEEDLE ASPIRATION Left 06/10/15   Knee  . FINE NEEDLE ASPIRATION  05/26/15   Back  . JOINT REPLACEMENT    . TONSILLECTOMY    . TOTAL KNEE ARTHROPLASTY Right  01/31/2014  . TOTAL KNEE ARTHROPLASTY Right 01/31/2014   Procedure: RIGHT TOTAL KNEE ARTHROPLASTY;  Surgeon: Kerin Salen, MD;  Location: [facility];  Service: Orthopedics;  Laterality: Right;    Family History  Problem Relation Age of Onset  . Arthritis Other   . Diabetes Other   . Cancer Other        prostate  . Heart disease Brother        questionable  . Colon cancer Daughter     Social History   Socioeconomic History  . Marital status: Widowed    Spouse name: Not on file  . Number of children: Not on file  . Years of education: Not on file  . Highest education level: Not on file  Occupational History    Comment: retired Biomedical scientist     Comment: Restaurant manager, fast food  Social Needs  . Financial resource strain: Not on file  . Food insecurity:    Worry: Not on file    Inability: Not on file  . Transportation needs:    Medical: Not on file    Non-medical: Not on file  Tobacco Use  . Smoking status: Former Smoker    Years: 2.00    Types: Cigars    Last attempt to quit: 04/12/1995    Years since quitting: 22.6  . Smokeless tobacco:  Never Used  Substance and Sexual Activity  . Alcohol use: No  . Drug use: No  . Sexual activity: Never  Lifestyle  . Physical activity:    Days per week: Not on file    Minutes per session: Not on file  . Stress: Not on file  Relationships  . Social connections:    Talks on phone: Not on file    Gets together: Not on file    Attends religious service: Not on file    Active member of club or organization: Not on file    Attends meetings of clubs or organizations: Not on file    Relationship status: Not on file  . Intimate partner violence:    Fear of current or ex partner: Not on file    Emotionally abused: Not on file    Physically abused: Not on file    Forced sexual activity: Not on file  Other Topics Concern  . Not on file  Social History Narrative   Works out in the garden    Outpatient Medications Prior to Visit  Medication Sig  Dispense Refill  . acetaminophen-codeine (TYLENOL #3) 300-30 MG tablet Take 1 tablet by mouth as needed.  0  . atorvastatin (LIPITOR) 20 MG tablet TAKE 1 TABLET BY MOUTH DAILY 90 tablet 2  . Coenzyme Q10 (CO Q 10 PO) Take 50 mg by mouth daily.    . finasteride (PROSCAR) 5 MG tablet Take 5 mg by mouth daily.    . furosemide (LASIX) 20 MG tablet Take 1 tablet (20 mg total) by mouth every other day. 90 tablet 3  . gabapentin (NEURONTIN) 600 MG tablet TAKE 1 TABLET BY MOUTH THREE TIMES A DAY 90 tablet 2  . glipiZIDE (GLUCOTROL) 5 MG tablet TAKE 1 TABLET BY MOUTH TWICE A DAY 180 tablet 1  . glucose blood (ONETOUCH VERIO) test strip Use twice daily to check blood sugar.  DX E11.9 100 each 1  . latanoprost (XALATAN) 0.005 % ophthalmic solution Place 1 drop into both eyes at bedtime.    . metFORMIN (GLUCOPHAGE) 1000 MG tablet TAKE 1 TABLET BY MOUTH 2 TIMES DAILY WITH A MEAL 180 tablet 2  . methocarbamol (ROBAXIN) 500 MG tablet Take 1 tablet by mouth daily.  0  . Omega-3 Fatty Acids (FISH OIL) 1000 MG CAPS Take 1 capsule by mouth 2 (two) times daily.    Marland Kitchen omeprazole (PRILOSEC) 20 MG capsule TAKE 1 CAPSULE BY MOUTH DAILY 90 capsule 2  . ramipril (ALTACE) 2.5 MG capsule TAKE 1 CAPSULE BY MOUTH DAILY 90 capsule 2  . traMADol (ULTRAM) 50 MG tablet Take 1 tablet by mouth daily.  0   No facility-administered medications prior to visit.     No Known Allergies  Review of Systems  Constitutional: Negative for fever and malaise/fatigue.  HENT: Negative for congestion.   Eyes: Negative for blurred vision.  Respiratory: Negative for cough and shortness of breath.   Cardiovascular: Negative for chest pain, palpitations and leg swelling.  Gastrointestinal: Negative for vomiting.  Musculoskeletal: Negative for back pain.  Skin: Negative for rash.  Neurological: Negative for loss of consciousness and headaches.       Objective:    Physical Exam  Constitutional: He is oriented to person, place, and time.  Vital signs are normal. He appears well-developed and well-nourished. He is sleeping.  HENT:  Head: Normocephalic and atraumatic.  Mouth/Throat: Oropharynx is clear and moist.  Eyes: Pupils are equal, round, and reactive to light. EOM  are normal.  Neck: Normal range of motion. Neck supple. No thyromegaly present.  Cardiovascular: Normal rate and regular rhythm.  No murmur heard. Pulmonary/Chest: Effort normal and breath sounds normal. No respiratory distress. He has no wheezes. He has no rales. He exhibits no tenderness.  Musculoskeletal: He exhibits no edema or tenderness.  Neurological: He is alert and oriented to person, place, and time.  Skin: Skin is warm and dry.  Psychiatric: He has a normal mood and affect. His behavior is normal. Judgment and thought content normal.  Nursing note and vitals reviewed.  Diabetic Foot Exam - Simple   Simple Foot Form Diabetic Foot exam was performed with the following findings:  Yes 11/28/2017 10:56 AM  Visual Inspection No deformities, no ulcerations, no other skin breakdown bilaterally:  Yes Sensation Testing Intact to touch and monofilament testing bilaterally:  Yes Pulse Check Posterior Tibialis and Dorsalis pulse intact bilaterally:  Yes Comments     BP (!) 119/52 (BP Location: Right Arm, Cuff Size: Normal)   Pulse 70   Temp 98.5 F (36.9 C) (Oral)   Resp 16   Ht 5\' 6"  (1.676 m)   Wt 167 lb 9.6 oz (76 kg)   SpO2 97%   BMI 27.05 kg/m  Wt Readings from Last 3 Encounters:  11/28/17 167 lb 9.6 oz (76 kg)  08/22/17 164 lb 9.6 oz (74.7 kg)  12/26/16 165 lb 3.2 oz (74.9 kg)     Lab Results  Component Value Date   WBC 3.9 (L) 10/27/2016   HGB 12.1 (L) 10/27/2016   HCT 36.2 (L) 10/27/2016   PLT 278.0 10/27/2016   GLUCOSE 190 (H) 11/28/2017   CHOL 136 11/28/2017   TRIG 106.0 11/28/2017   HDL 45.60 11/28/2017   LDLCALC 69 11/28/2017   ALT 11 11/28/2017   AST 12 11/28/2017   NA 139 11/28/2017   K 4.9 11/28/2017   CL 104  11/28/2017   CREATININE 1.14 11/28/2017   BUN 14 11/28/2017   CO2 30 11/28/2017   TSH 1.89 08/17/2010   PSA 1.55 04/11/2014   INR 0.95 08/31/2015   HGBA1C 9.0 (H) 11/28/2017   MICROALBUR 0.9 06/30/2015    Lab Results  Component Value Date   TSH 1.89 08/17/2010   Lab Results  Component Value Date   WBC 3.9 (L) 10/27/2016   HGB 12.1 (L) 10/27/2016   HCT 36.2 (L) 10/27/2016   MCV 97.7 10/27/2016   PLT 278.0 10/27/2016   Lab Results  Component Value Date   NA 139 11/28/2017   K 4.9 11/28/2017   CO2 30 11/28/2017   GLUCOSE 190 (H) 11/28/2017   BUN 14 11/28/2017   CREATININE 1.14 11/28/2017   BILITOT 0.5 11/28/2017   ALKPHOS 75 11/28/2017   AST 12 11/28/2017   ALT 11 11/28/2017   PROT 6.4 11/28/2017   ALBUMIN 4.1 11/28/2017   CALCIUM 9.7 11/28/2017   ANIONGAP 5 08/31/2015   GFR 79.24 11/28/2017   Lab Results  Component Value Date   CHOL 136 11/28/2017   Lab Results  Component Value Date   HDL 45.60 11/28/2017   Lab Results  Component Value Date   LDLCALC 69 11/28/2017   Lab Results  Component Value Date   TRIG 106.0 11/28/2017   Lab Results  Component Value Date   CHOLHDL 3 11/28/2017   Lab Results  Component Value Date   HGBA1C 9.0 (H) 11/28/2017       Assessment & Plan:   Problem List Items Addressed This  Visit      Unprioritized   DM (diabetes mellitus) type II, controlled, with peripheral vascular disorder ([place])    hgba1c to be checked, minimize simple carbs. Increase exercise as tolerated. Continue current meds       Essential hypertension    Well controlled, no changes to meds. Encouraged heart healthy diet such as the DASH diet and exercise as tolerated.       Relevant Orders   Hemoglobin A1c (Completed)   Comprehensive metabolic panel (Completed)   Lipid panel (Completed)   Hyperlipidemia LDL goal <70    Tolerating statin, encouraged heart healthy diet, avoid trans fats, minimize simple carbs and saturated fats. Increase exercise  as tolerated       Other Visit Diagnoses    DM (diabetes mellitus) type II uncontrolled, periph vascular disorder ([place])    -  Primary   Relevant Orders   Hemoglobin A1c (Completed)   Comprehensive metabolic panel (Completed)   Lipid panel (Completed)   Hyperlipidemia associated with type 2 diabetes mellitus ([facility])       Relevant Orders   Hemoglobin A1c (Completed)   Comprehensive metabolic panel (Completed)   Lipid panel (Completed)      I am having Lenna Sciara maintain his finasteride, latanoprost, Coenzyme Q10 (CO Q 10 PO), acetaminophen-codeine, traMADol, methocarbamol, Fish Oil, furosemide, omeprazole, ramipril, atorvastatin, glucose blood, metFORMIN, glipiZIDE, and gabapentin.  No orders of the defined types were placed in this encounter.   CMA served as Education administrator during this visit. History, Physical and Plan performed by medical provider. Documentation and orders reviewed and attested to.  Ann Held, DO

## 2017-11-28 NOTE — Assessment & Plan Note (Signed)
Well controlled, no changes to meds. Encouraged heart healthy diet such as the DASH diet and exercise as tolerated.  °

## 2017-11-28 NOTE — Patient Instructions (Signed)

## 2017-11-28 NOTE — Assessment & Plan Note (Signed)
Tolerating statin, encouraged heart healthy diet, avoid trans fats, minimize simple carbs and saturated fats. Increase exercise as tolerated 

## 2017-11-29 ENCOUNTER — Other Ambulatory Visit: Payer: Self-pay

## 2017-11-29 DIAGNOSIS — E1151 Type 2 diabetes mellitus with diabetic peripheral angiopathy without gangrene: Secondary | ICD-10-CM

## 2017-11-29 DIAGNOSIS — I10 Essential (primary) hypertension: Secondary | ICD-10-CM

## 2017-11-29 DIAGNOSIS — IMO0002 Reserved for concepts with insufficient information to code with codable children: Secondary | ICD-10-CM

## 2017-11-29 DIAGNOSIS — E1165 Type 2 diabetes mellitus with hyperglycemia: Principal | ICD-10-CM

## 2017-11-29 DIAGNOSIS — E785 Hyperlipidemia, unspecified: Secondary | ICD-10-CM

## 2017-11-29 MED ORDER — GLIPIZIDE 10 MG PO TABS: 10.0000 mg | ORAL_TABLET | Freq: Two times a day (BID) | ORAL | 2 refills | Status: DC

## 2017-11-29 NOTE — Progress Notes (Signed)
g

## 2017-12-01 ENCOUNTER — Other Ambulatory Visit: Payer: Self-pay

## 2018-01-01 DIAGNOSIS — M5412 Radiculopathy, cervical region: Secondary | ICD-10-CM | POA: Diagnosis not present

## 2018-01-09 DIAGNOSIS — M542 Cervicalgia: Secondary | ICD-10-CM | POA: Diagnosis not present

## 2018-01-15 DIAGNOSIS — M5412 Radiculopathy, cervical region: Secondary | ICD-10-CM | POA: Diagnosis not present

## 2018-01-17 DIAGNOSIS — M5412 Radiculopathy, cervical region: Secondary | ICD-10-CM | POA: Diagnosis not present

## 2018-01-17 DIAGNOSIS — M25511 Pain in right shoulder: Secondary | ICD-10-CM | POA: Diagnosis not present

## 2018-01-18 DIAGNOSIS — H401131 Primary open-angle glaucoma, bilateral, mild stage: Secondary | ICD-10-CM | POA: Diagnosis not present

## 2018-01-18 DIAGNOSIS — M25511 Pain in right shoulder: Secondary | ICD-10-CM | POA: Diagnosis not present

## 2018-01-18 DIAGNOSIS — H2513 Age-related nuclear cataract, bilateral: Secondary | ICD-10-CM | POA: Diagnosis not present

## 2018-01-18 DIAGNOSIS — E119 Type 2 diabetes mellitus without complications: Secondary | ICD-10-CM | POA: Diagnosis not present

## 2018-01-18 DIAGNOSIS — M5412 Radiculopathy, cervical region: Secondary | ICD-10-CM | POA: Diagnosis not present

## 2018-02-01 DIAGNOSIS — M5412 Radiculopathy, cervical region: Secondary | ICD-10-CM | POA: Diagnosis not present

## 2018-02-01 DIAGNOSIS — M542 Cervicalgia: Secondary | ICD-10-CM | POA: Diagnosis not present

## 2018-02-05 DIAGNOSIS — M5412 Radiculopathy, cervical region: Secondary | ICD-10-CM | POA: Diagnosis not present

## 2018-02-05 DIAGNOSIS — M25511 Pain in right shoulder: Secondary | ICD-10-CM | POA: Diagnosis not present

## 2018-02-08 DIAGNOSIS — M25511 Pain in right shoulder: Secondary | ICD-10-CM | POA: Diagnosis not present

## 2018-02-08 DIAGNOSIS — M5412 Radiculopathy, cervical region: Secondary | ICD-10-CM | POA: Diagnosis not present

## 2018-02-12 DIAGNOSIS — M5412 Radiculopathy, cervical region: Secondary | ICD-10-CM | POA: Diagnosis not present

## 2018-02-12 DIAGNOSIS — M25511 Pain in right shoulder: Secondary | ICD-10-CM | POA: Diagnosis not present

## 2018-02-14 DIAGNOSIS — M5412 Radiculopathy, cervical region: Secondary | ICD-10-CM | POA: Diagnosis not present

## 2018-02-14 DIAGNOSIS — M25511 Pain in right shoulder: Secondary | ICD-10-CM | POA: Diagnosis not present

## 2018-02-19 DIAGNOSIS — M5412 Radiculopathy, cervical region: Secondary | ICD-10-CM | POA: Diagnosis not present

## 2018-02-19 DIAGNOSIS — M25511 Pain in right shoulder: Secondary | ICD-10-CM | POA: Diagnosis not present

## 2018-02-19 DIAGNOSIS — M542 Cervicalgia: Secondary | ICD-10-CM | POA: Diagnosis not present

## 2018-02-21 ENCOUNTER — Ambulatory Visit (INDEPENDENT_AMBULATORY_CARE_PROVIDER_SITE_OTHER): Payer: Medicare Other

## 2018-02-21 DIAGNOSIS — Z23 Encounter for immunization: Secondary | ICD-10-CM | POA: Diagnosis not present

## 2018-02-21 DIAGNOSIS — M25511 Pain in right shoulder: Secondary | ICD-10-CM | POA: Diagnosis not present

## 2018-02-21 DIAGNOSIS — M5412 Radiculopathy, cervical region: Secondary | ICD-10-CM | POA: Diagnosis not present

## 2018-02-26 DIAGNOSIS — C61 Malignant neoplasm of prostate: Secondary | ICD-10-CM | POA: Diagnosis not present

## 2018-02-27 ENCOUNTER — Other Ambulatory Visit: Payer: Self-pay

## 2018-03-05 DIAGNOSIS — C61 Malignant neoplasm of prostate: Secondary | ICD-10-CM | POA: Diagnosis not present

## 2018-03-05 DIAGNOSIS — N400 Enlarged prostate without lower urinary tract symptoms: Secondary | ICD-10-CM | POA: Diagnosis not present

## 2018-03-11 ENCOUNTER — Other Ambulatory Visit: Payer: Self-pay | Admitting: Family Medicine

## 2018-04-24 DIAGNOSIS — H2513 Age-related nuclear cataract, bilateral: Secondary | ICD-10-CM | POA: Diagnosis not present

## 2018-04-24 DIAGNOSIS — H401131 Primary open-angle glaucoma, bilateral, mild stage: Secondary | ICD-10-CM | POA: Diagnosis not present

## 2018-04-24 DIAGNOSIS — H524 Presbyopia: Secondary | ICD-10-CM | POA: Diagnosis not present

## 2018-04-24 DIAGNOSIS — E119 Type 2 diabetes mellitus without complications: Secondary | ICD-10-CM | POA: Diagnosis not present

## 2018-05-17 ENCOUNTER — Encounter: Payer: Self-pay | Admitting: Gastroenterology

## 2018-05-23 ENCOUNTER — Encounter: Payer: Self-pay | Admitting: Gastroenterology

## 2018-05-31 ENCOUNTER — Ambulatory Visit (INDEPENDENT_AMBULATORY_CARE_PROVIDER_SITE_OTHER): Payer: Medicare Other | Admitting: Family Medicine

## 2018-05-31 ENCOUNTER — Encounter: Payer: Self-pay | Admitting: Family Medicine

## 2018-05-31 VITALS — BP 118/70 | HR 68 | Temp 97.5°F | Resp 16 | Ht 65.0 in | Wt 175.0 lb

## 2018-05-31 DIAGNOSIS — I10 Essential (primary) hypertension: Secondary | ICD-10-CM | POA: Diagnosis not present

## 2018-05-31 DIAGNOSIS — E1169 Type 2 diabetes mellitus with other specified complication: Secondary | ICD-10-CM | POA: Diagnosis not present

## 2018-05-31 DIAGNOSIS — E1139 Type 2 diabetes mellitus with other diabetic ophthalmic complication: Secondary | ICD-10-CM | POA: Diagnosis not present

## 2018-05-31 DIAGNOSIS — E1165 Type 2 diabetes mellitus with hyperglycemia: Secondary | ICD-10-CM | POA: Diagnosis not present

## 2018-05-31 DIAGNOSIS — E785 Hyperlipidemia, unspecified: Secondary | ICD-10-CM

## 2018-05-31 DIAGNOSIS — IMO0002 Reserved for concepts with insufficient information to code with codable children: Secondary | ICD-10-CM

## 2018-05-31 LAB — HEMOGLOBIN A1C: Hgb A1c MFr Bld: 8.4 % — ABNORMAL HIGH (ref 4.6–6.5)

## 2018-05-31 LAB — LIPID PANEL
Cholesterol: 134 mg/dL (ref 0–200)
HDL: 44.7 mg/dL (ref >39.00)
LDL Cholesterol: 70 mg/dL (ref 0–99)
NonHDL: 89.11
TRIGLYCERIDES: 97 mg/dL (ref 0.0–149.0)
Total CHOL/HDL Ratio: 3
VLDL: 19.4 mg/dL (ref 0.0–40.0)

## 2018-05-31 LAB — COMPREHENSIVE METABOLIC PANEL
ALBUMIN: 4.2 g/dL (ref 3.5–5.2)
ALK PHOS: 75 U/L (ref 39–117)
ALT: 11 U/L (ref 0–53)
AST: 10 U/L (ref 0–37)
BILIRUBIN TOTAL: 0.4 mg/dL (ref 0.2–1.2)
BUN: 15 mg/dL (ref 6–23)
CO2: 30 mEq/L (ref 19–32)
CREATININE: 1.14 mg/dL (ref 0.40–1.50)
Calcium: 9.1 mg/dL (ref 8.4–10.5)
Chloride: 102 mEq/L (ref 96–112)
GFR: 74.46 mL/min (ref >60.00)
Glucose, Bld: 161 mg/dL — ABNORMAL HIGH (ref 70–99)
Potassium: 4.6 mEq/L (ref 3.5–5.1)
Sodium: 139 mEq/L (ref 135–145)
TOTAL PROTEIN: 6.3 g/dL (ref 6.0–8.3)

## 2018-05-31 NOTE — Assessment & Plan Note (Signed)
Well controlled, no changes to meds. Encouraged heart healthy diet such as the DASH diet and exercise as tolerated.  °

## 2018-05-31 NOTE — Progress Notes (Signed)
Patient ID: [name] [name], male    DOB: 10-11-1934  Age: 84 y.o. MRN: [id number]    Subjective:  Subjective  HPI [name] [name] presents for f/u dm, chol and bp.    HYPERTENSION   Blood pressure range-good  Chest pain- no      Dyspnea- no Lightheadedness- no   Edema- no  Other side effects - no   Medication compliance: good Low salt diet- yes    DIABETES    Blood Sugar ranges-doing good per pt  Polyuria- no  New Visual problems- no  Hypoglycemic symptoms- no  Other side effects-no Medication compliance - good Last eye exam- recent  02/2018 Foot exam- today   HYPERLIPIDEMIA  Medication compliance- good RUQ pain- no  Muscle aches- no Other side effects-no      Review of Systems  Constitutional: Negative for appetite change, diaphoresis, fatigue and unexpected weight change.  Eyes: Negative for pain, redness and visual disturbance.  Respiratory: Negative for cough, chest tightness, shortness of breath and wheezing.   Cardiovascular: Negative for chest pain, palpitations and leg swelling.  Endocrine: Negative for cold intolerance, heat intolerance, polydipsia, polyphagia and polyuria.  Genitourinary: Negative for difficulty urinating, dysuria and frequency.  Neurological: Negative for dizziness, light-headedness, numbness and headaches.    History Past Medical History:  Diagnosis Date  . Diabetes mellitus 1955   type 2 - fasting 100-120  . Diverticulosis 2009  . GERD (gastroesophageal reflux disease)   . Glaucoma    bilateral  . History of shingles   . Hyperlipidemia    5 yrs  . Hypertension   . Osteoarthritis    "knees" (01/31/2014)  . Prostate cancer ([place])    "no treatments; it's going away by itself w/the pills I take"    He has a past surgical history that includes Colonoscopy; Total knee arthroplasty (Right, 01/31/2014); Tonsillectomy; Total knee arthroplasty (Right, 01/31/2014); Joint replacement; Fine needle aspiration (Left, 06/10/15); Fine needle  aspiration (05/26/15); and Anterior cervical decomp/discectomy fusion (N/A, 09/03/2015).   His family history includes Arthritis in an other family member; Cancer in an other family member; Colon cancer in his daughter; Diabetes in an other family member; Heart disease in his brother.He reports that he quit smoking about 23 years ago. His smoking use included cigars. He quit after 2.00 years of use. He has never used smokeless tobacco. He reports that he does not drink alcohol or use drugs.  Current Outpatient Medications on File Prior to Visit  Medication Sig Dispense Refill  . acetaminophen-codeine (TYLENOL #3) 300-30 MG tablet Take 1 tablet by mouth as needed.  0  . atorvastatin (LIPITOR) 20 MG tablet TAKE 1 TABLET BY MOUTH EVERY DAY 90 tablet 2  . Coenzyme Q10 (CO Q 10 PO) Take 50 mg by mouth daily.    . finasteride (PROSCAR) 5 MG tablet Take 5 mg by mouth daily.    . furosemide (LASIX) 20 MG tablet Take 1 tablet (20 mg total) by mouth every other day. 90 tablet 3  . gabapentin (NEURONTIN) 600 MG tablet TAKE 1 TABLET BY MOUTH THREE TIMES A DAY 90 tablet 2  . glipiZIDE (GLUCOTROL) 10 MG tablet Take 1 tablet (10 mg total) by mouth 2 (two) times daily before a meal. 30 tablet 2  . glucose blood (ONETOUCH VERIO) test strip Use twice daily to check blood sugar.  DX E11.9 100 each 1  . latanoprost (XALATAN) 0.005 % ophthalmic solution Place 1 drop into both eyes at bedtime.    . metFORMIN (  GLUCOPHAGE) 1000 MG tablet TAKE 1 TABLET BY MOUTH 2 TIMES DAILY WITH A MEAL 180 tablet 2  . methocarbamol (ROBAXIN) 500 MG tablet Take 1 tablet by mouth daily.  0  . Omega-3 Fatty Acids (FISH OIL) 1000 MG CAPS Take 1 capsule by mouth 2 (two) times daily.    Marland Kitchen omeprazole (PRILOSEC) 20 MG capsule TAKE 1 CAPSULE BY MOUTH EVERY DAY 90 capsule 2  . ramipril (ALTACE) 2.5 MG capsule TAKE 1 CAPSULE BY MOUTH EVERY DAY 90 capsule 2  . traMADol (ULTRAM) 50 MG tablet Take 1 tablet by mouth daily.  0   No current  facility-administered medications on file prior to visit.      Objective:  Objective  Physical Exam Constitutional:      General: He is sleeping.     Appearance: He is well-developed.  HENT:     Head: Normocephalic and atraumatic.  Eyes:     Pupils: Pupils are equal, round, and reactive to light.  Neck:     Musculoskeletal: Normal range of motion and neck supple.     Thyroid: No thyromegaly.  Cardiovascular:     Rate and Rhythm: Normal rate and regular rhythm.     Heart sounds: No murmur.  Pulmonary:     Effort: Pulmonary effort is normal. No respiratory distress.     Breath sounds: Normal breath sounds. No wheezing or rales.  Chest:     Chest wall: No tenderness.  Musculoskeletal:        General: No tenderness.  Skin:    General: Skin is warm and dry.  Neurological:     Mental Status: He is oriented to person, place, and time.  Psychiatric:        Behavior: Behavior normal.        Thought Content: Thought content normal.        Judgment: Judgment normal.    BP 118/70 (BP Location: Right Arm, Cuff Size: Normal)   Pulse 68   Temp (!) 97.5 F (36.4 C) (Oral)   Resp 16   Ht 5\' 5"  (1.651 m)   Wt 175 lb (79.4 kg)   SpO2 98%   BMI 29.12 kg/m  Wt Readings from Last 3 Encounters:  05/31/18 175 lb (79.4 kg)  11/28/17 167 lb 9.6 oz (76 kg)  08/22/17 164 lb 9.6 oz (74.7 kg)     Lab Results  Component Value Date   WBC 3.9 (L) 10/27/2016   HGB 12.1 (L) 10/27/2016   HCT 36.2 (L) 10/27/2016   PLT 278.0 10/27/2016   GLUCOSE 190 (H) 11/28/2017   CHOL 136 11/28/2017   TRIG 106.0 11/28/2017   HDL 45.60 11/28/2017   LDLCALC 69 11/28/2017   ALT 11 11/28/2017   AST 12 11/28/2017   NA 139 11/28/2017   K 4.9 11/28/2017   CL 104 11/28/2017   CREATININE 1.14 11/28/2017   BUN 14 11/28/2017   CO2 30 11/28/2017   TSH 1.89 08/17/2010   PSA 1.55 04/11/2014   INR 0.95 08/31/2015   HGBA1C 9.0 (H) 11/28/2017   MICROALBUR 0.9 06/30/2015    Dg Chest 2 View  Result Date:  08/31/2015 CLINICAL DATA:  Preoperative evaluation for cervical fusion EXAM: CHEST  2 VIEW COMPARISON:  01/31/2014 FINDINGS: Cardiac shadow is within normal limits. The lungs are well aerated bilaterally. No focal infiltrate or sizable effusion is seen. No acute bony abnormality is noted. IMPRESSION: No active cardiopulmonary disease. Electronically Signed   By: Inez Catalina M.D.   On:  08/31/2015 10:04     Assessment & Plan:  Plan  I am having Lenna Sciara maintain his finasteride, latanoprost, Coenzyme Q10 (CO Q 10 PO), acetaminophen-codeine, traMADol, methocarbamol, Fish Oil, furosemide, glucose blood, metFORMIN, gabapentin, glipiZIDE, ramipril, atorvastatin, and omeprazole.  No orders of the defined types were placed in this encounter.   Problem List Items Addressed This Visit      Unprioritized   DM (diabetes mellitus) type II uncontrolled with eye manifestation ([place])    hgba1c to be checked, minimize simple carbs. Increase exercise as tolerated. Continue current meds       Relevant Orders   Lipid panel   Hemoglobin A1c   Comprehensive metabolic panel   Essential hypertension    Well controlled, no changes to meds. Encouraged heart healthy diet such as the DASH diet and exercise as tolerated.       Hyperlipidemia LDL goal <70    Tolerating statin, encouraged heart healthy diet, avoid trans fats, minimize simple carbs and saturated fats. Increase exercise as tolerated       Other Visit Diagnoses    Hyperlipidemia associated with type 2 diabetes mellitus ([place])    -  Primary   Relevant Orders   Lipid panel   Hemoglobin A1c   Comprehensive metabolic panel      Follow-up: Return in about 6 months (around 11/29/2018), or if symptoms worsen or fail to improve, for hypertension, hyperlipidemia, diabetes II.  Ann Held, DO

## 2018-05-31 NOTE — Assessment & Plan Note (Signed)
hgba1c to be checked, minimize simple carbs. Increase exercise as tolerated. Continue current meds  

## 2018-05-31 NOTE — Patient Instructions (Signed)
Carbohydrate Counting for Diabetes Mellitus, Adult  Carbohydrate counting is a method of keeping track of how many carbohydrates you eat. Eating carbohydrates naturally increases the amount of sugar (glucose) in the blood. Counting how many carbohydrates you eat helps keep your blood glucose within normal limits, which helps you manage your diabetes (diabetes mellitus). It is important to know how many carbohydrates you can safely have in each meal. This is different for every person. A diet and nutrition specialist (registered dietitian) can help you make a meal plan and calculate how many carbohydrates you should have at each meal and snack. Carbohydrates are found in the following foods:  Grains, such as breads and cereals.  Dried beans and soy products.  Starchy vegetables, such as potatoes, peas, and corn.  Fruit and fruit juices.  Milk and yogurt.  Sweets and snack foods, such as cake, cookies, candy, chips, and soft drinks. How do I count carbohydrates? There are two ways to count carbohydrates in food. You can use either of the methods or a combination of both. Reading "Nutrition Facts" on packaged food The "Nutrition Facts" list is included on the labels of almost all packaged foods and beverages in the [place] It includes:  The serving size.  Information about nutrients in each serving, including the grams (g) of carbohydrate per serving. To use the "Nutrition Facts":  Decide how many servings you will have.  Multiply the number of servings by the number of carbohydrates per serving.  The resulting number is the total amount of carbohydrates that you will be having. Learning standard serving sizes of other foods When you eat carbohydrate foods that are not packaged or do not include "Nutrition Facts" on the label, you need to measure the servings in order to count the amount of carbohydrates:  Measure the foods that you will eat with a food scale or measuring cup, if needed.   Decide how many standard-size servings you will eat.  Multiply the number of servings by 15. Most carbohydrate-rich foods have about 15 g of carbohydrates per serving. ? For example, if you eat 8 oz (170 g) of strawberries, you will have eaten 2 servings and 30 g of carbohydrates (2 servings x 15 g = 30 g).  For foods that have more than one food mixed, such as soups and casseroles, you must count the carbohydrates in each food that is included. The following list contains standard serving sizes of common carbohydrate-rich foods. Each of these servings has about 15 g of carbohydrates:   hamburger bun or  English muffin.   oz (15 mL) syrup.   oz (14 g) jelly.  1 slice of bread.  1 six-inch tortilla.  3 oz (85 g) cooked rice or pasta.  4 oz (113 g) cooked dried beans.  4 oz (113 g) starchy vegetable, such as peas, corn, or potatoes.  4 oz (113 g) hot cereal.  4 oz (113 g) mashed potatoes or  of a large baked potato.  4 oz (113 g) canned or frozen fruit.  4 oz (120 mL) fruit juice.  4-6 crackers.  6 chicken nuggets.  6 oz (170 g) unsweetened dry cereal.  6 oz (170 g) plain fat-free yogurt or yogurt sweetened with artificial sweeteners.  8 oz (240 mL) milk.  8 oz (170 g) fresh fruit or one small piece of fruit.  24 oz (680 g) popped popcorn. Example of carbohydrate counting Sample meal  3 oz (85 g) chicken breast.  6 oz (170 g)   brown rice.  4 oz (113 g) corn.  8 oz (240 mL) milk.  8 oz (170 g) strawberries with sugar-free whipped topping. Carbohydrate calculation 1. Identify the foods that contain carbohydrates: ? Rice. ? Corn. ? Milk. ? Strawberries. 2. Calculate how many servings you have of each food: ? 2 servings rice. ? 1 serving corn. ? 1 serving milk. ? 1 serving strawberries. 3. Multiply each number of servings by 15 g: ? 2 servings rice x 15 g = 30 g. ? 1 serving corn x 15 g = 15 g. ? 1 serving milk x 15 g = 15 g. ? 1 serving  strawberries x 15 g = 15 g. 4. Add together all of the amounts to find the total grams of carbohydrates eaten: ? 30 g + 15 g + 15 g + 15 g = 75 g of carbohydrates total. Summary  Carbohydrate counting is a method of keeping track of how many carbohydrates you eat.  Eating carbohydrates naturally increases the amount of sugar (glucose) in the blood.  Counting how many carbohydrates you eat helps keep your blood glucose within normal limits, which helps you manage your diabetes.  A diet and nutrition specialist (registered dietitian) can help you make a meal plan and calculate how many carbohydrates you should have at each meal and snack. This information is not intended to replace advice given to you by your health care provider. Make sure you discuss any questions you have with your health care provider. Document Released: 03/28/2005 Document Revised: 10/05/2016 Document Reviewed: 09/09/2015 Elsevier Interactive Patient Education  2019 Elsevier Inc.  

## 2018-05-31 NOTE — Assessment & Plan Note (Signed)
Tolerating statin, encouraged heart healthy diet, avoid trans fats, minimize simple carbs and saturated fats. Increase exercise as tolerated 

## 2018-06-11 ENCOUNTER — Other Ambulatory Visit: Payer: Self-pay | Admitting: *Deleted

## 2018-06-11 DIAGNOSIS — E1169 Type 2 diabetes mellitus with other specified complication: Secondary | ICD-10-CM

## 2018-06-11 DIAGNOSIS — E785 Hyperlipidemia, unspecified: Principal | ICD-10-CM

## 2018-06-11 DIAGNOSIS — IMO0002 Reserved for concepts with insufficient information to code with codable children: Secondary | ICD-10-CM

## 2018-06-11 DIAGNOSIS — E1165 Type 2 diabetes mellitus with hyperglycemia: Secondary | ICD-10-CM

## 2018-06-11 DIAGNOSIS — I10 Essential (primary) hypertension: Secondary | ICD-10-CM

## 2018-06-11 DIAGNOSIS — E1139 Type 2 diabetes mellitus with other diabetic ophthalmic complication: Secondary | ICD-10-CM

## 2018-06-12 ENCOUNTER — Telehealth: Payer: Self-pay | Admitting: *Deleted

## 2018-06-12 NOTE — Telephone Encounter (Signed)
Copied from [place] [id number]. Topic: General - Other >> Jun 11, 2018 11:23 AM [name] [name] wrote: Reason for CRM: no crm  Pt is returning call  Please call [phone]

## 2018-06-13 ENCOUNTER — Other Ambulatory Visit: Payer: Self-pay | Admitting: *Deleted

## 2018-06-13 MED ORDER — NATEGLINIDE 60 MG PO TABS: 60.0000 mg | ORAL_TABLET | Freq: Three times a day (TID) | ORAL | 2 refills | Status: DC

## 2018-06-13 NOTE — Telephone Encounter (Signed)
See labs 

## 2018-06-18 ENCOUNTER — Other Ambulatory Visit: Payer: Self-pay

## 2018-06-18 ENCOUNTER — Ambulatory Visit (AMBULATORY_SURGERY_CENTER): Payer: Self-pay | Admitting: *Deleted

## 2018-06-18 VITALS — Ht 65.0 in | Wt 171.8 lb

## 2018-06-18 DIAGNOSIS — Z1211 Encounter for screening for malignant neoplasm of colon: Secondary | ICD-10-CM

## 2018-06-18 DIAGNOSIS — Z800 Family history of malignant neoplasm of digestive organs: Secondary | ICD-10-CM

## 2018-06-18 MED ORDER — PEG-KCL-NACL-NASULF-NA ASC-C 140 G PO SOLR: 1.0000 | ORAL | 0 refills | Status: DC

## 2018-06-18 NOTE — Progress Notes (Signed)
No egg or soy allergy known to patient  No issues with past sedation with any surgeries  or procedures, no intubation problems  No diet pills per patient No home 02 use per patient  No blood thinners per patient  Pt denies issues with constipation  No A fib or A flutter  EMMI video sent to pt's e mail - pt has no e mail at home  Plenvu sample- Lot [id number] Exp 10/2018

## 2018-07-03 ENCOUNTER — Telehealth: Payer: Self-pay | Admitting: *Deleted

## 2018-07-03 NOTE — Telephone Encounter (Signed)
Spoke with patient and informed him that we need to reschedule his procedure d/t Covid-19 procedure restrictions.  Advised him that we would call him back to reschedule.

## 2018-07-06 ENCOUNTER — Encounter: Payer: Medicare Other | Admitting: Gastroenterology

## 2018-08-10 ENCOUNTER — Other Ambulatory Visit: Payer: Self-pay | Admitting: *Deleted

## 2018-08-10 DIAGNOSIS — IMO0002 Reserved for concepts with insufficient information to code with codable children: Secondary | ICD-10-CM

## 2018-08-10 DIAGNOSIS — E1165 Type 2 diabetes mellitus with hyperglycemia: Principal | ICD-10-CM

## 2018-08-10 DIAGNOSIS — E1151 Type 2 diabetes mellitus with diabetic peripheral angiopathy without gangrene: Secondary | ICD-10-CM

## 2018-08-10 MED ORDER — GLIPIZIDE 10 MG PO TABS: 10.0000 mg | ORAL_TABLET | Freq: Two times a day (BID) | ORAL | 1 refills | Status: DC

## 2018-08-13 ENCOUNTER — Telehealth: Payer: Self-pay | Admitting: *Deleted

## 2018-08-13 NOTE — Telephone Encounter (Signed)
Per Dr. Tarri Glenn, ok to to schedule procedure at this time.  Colonoscopy scheduled for 08-21-18 at 800.  New instructions mailed to patient.   Pt states he has Plenvu prep at home.

## 2018-08-20 ENCOUNTER — Telehealth: Payer: Self-pay | Admitting: *Deleted

## 2018-08-20 NOTE — Telephone Encounter (Signed)
LMOM for pt to call back to [phone] to complete Covid-19 screening questions.

## 2018-08-20 NOTE — Telephone Encounter (Signed)
Covid-19 travel screening questions   Have you traveled in the last 14 days? no If yes where?  Do you now or have you had a fever in the last 14 days? no  Do you have any respiratory symptoms of shortness of breath or cough now or in the last 14 days? no  Do you have any family members or close contacts with diagnosed or suspected Covid-19? No  Pt is aware care partner will be waiting in car during procedure.  Pt will wear a mask intro building

## 2018-08-21 ENCOUNTER — Other Ambulatory Visit: Payer: Self-pay

## 2018-08-21 ENCOUNTER — Ambulatory Visit (AMBULATORY_SURGERY_CENTER): Payer: Medicare Other | Admitting: Gastroenterology

## 2018-08-21 ENCOUNTER — Encounter: Payer: Self-pay | Admitting: Gastroenterology

## 2018-08-21 VITALS — BP 130/75 | HR 66 | Temp 98.6°F | Resp 16 | Ht 65.0 in | Wt 175.0 lb

## 2018-08-21 DIAGNOSIS — K635 Polyp of colon: Secondary | ICD-10-CM

## 2018-08-21 DIAGNOSIS — Z1211 Encounter for screening for malignant neoplasm of colon: Secondary | ICD-10-CM | POA: Diagnosis not present

## 2018-08-21 DIAGNOSIS — D123 Benign neoplasm of transverse colon: Secondary | ICD-10-CM | POA: Diagnosis not present

## 2018-08-21 MED ORDER — SODIUM CHLORIDE 0.9 % IV SOLN: 500.0000 mL | Freq: Once | INTRAVENOUS | Status: DC

## 2018-08-21 NOTE — Progress Notes (Signed)
PT taken to PACU. Monitors in place. VSS. Report given to RN. 

## 2018-08-21 NOTE — Progress Notes (Signed)
Called to room to assist during endoscopic procedure.  Patient ID and intended procedure confirmed with present staff. Received instructions for my participation in the procedure from the performing physician.  

## 2018-08-21 NOTE — Progress Notes (Signed)
Pt's states no medical or surgical changes since previsit or office visit. 

## 2018-08-21 NOTE — Op Note (Signed)
[facility] Patient Name: [name] [name] Procedure Date: 08/21/2018 8:01 AM MRN: [id number] Endoscopist: Thornton Park MD, MD Age: 84 Referring MD:  Date of Birth: [birth year] Gender: Male Account #: [id number] Procedure:                Colonoscopy Indications:              Screening for colorectal malignant neoplasm. Normal                            screening exam 2009. No known family history of                            colon cancer or polyps. No baseline GI symptoms. Medicines:                See the Anesthesia note for documentation of the                            administered medications Procedure:                Pre-Anesthesia Assessment:                           - Prior to the procedure, a History and Physical                            was performed, and patient medications and                            allergies were reviewed. The patient's tolerance of                            previous anesthesia was also reviewed. The risks                            and benefits of the procedure and the sedation                            options and risks were discussed with the patient.                            All questions were answered, and informed consent                            was obtained. Prior Anticoagulants: The patient has                            taken no previous anticoagulant or antiplatelet                            agents. ASA Grade Assessment: III - A patient with                            severe systemic disease. After reviewing the risks  and benefits, the patient was deemed in                            satisfactory condition to undergo the procedure.                           After obtaining informed consent, the colonoscope                            was passed under direct vision. Throughout the                            procedure, the patient's blood pressure, pulse, and                            oxygen  saturations were monitored continuously. The                            Colonoscope was introduced through the anus and                            advanced to the the terminal ileum, with                            identification of the appendiceal orifice and IC                            valve. The colonoscopy was performed without                            difficulty. The patient tolerated the procedure                            well. The quality of the bowel preparation was                            good. The terminal ileum, ileocecal valve,                            appendiceal orifice, and rectum were photographed. Scope In: 8:07:58 AM Scope Out: 8:18:59 AM Scope Withdrawal Time: 0 hours 7 minutes 3 seconds  Total Procedure Duration: 0 hours 11 minutes 1 second  Findings:                 The perianal and digital rectal examinations were                            normal.                           A 3 mm polyp was found in the hepatic flexure. The                            polyp was sessile. The polyp was removed with a  cold snare. Resection and retrieval were complete.                            Estimated blood loss: none. Estimated blood loss:                            none.                           A few small-mouthed diverticula were found in the                            sigmoid colon and ascending colon.                           The exam was otherwise without abnormality on                            direct and retroflexion views. Small internal                            hemorrhoids were seen. Complications:            No immediate complications. Estimated blood loss:                            None. Estimated Blood Loss:     Estimated blood loss: none. Impression:               - One 3 mm polyp at the hepatic flexure, removed                            with a cold snare. Resected and retrieved.                           - Diverticulosis  in the sigmoid colon and in the                            ascending colon.                           - The examination was otherwise normal on direct                            and retroflexion views. Recommendation:           - Patient has a contact number available for                            emergencies. The signs and symptoms of potential                            delayed complications were discussed with the                            patient. Return to normal activities tomorrow.  Written discharge instructions were provided to the                            patient.                           - Resume previous diet today. High fiber diet                            recommended.                           - Continue present medications.                           - Await pathology results. I will send you a letter                            when the results are available.                           - Repeat colonoscopy is not recommended due to                            current age (52 years or older). Thornton Park MD, MD 08/21/2018 8:23:52 AM This report has been signed electronically.

## 2018-08-21 NOTE — Patient Instructions (Signed)
YOU HAD AN ENDOSCOPIC PROCEDURE TODAY AT [facility]:   Refer to the procedure report that was given to you for any specific questions about what was found during the examination.  If the procedure report does not answer your questions, please call your gastroenterologist to clarify.  If you requested that your care partner not be given the details of your procedure findings, then the procedure report has been included in a sealed envelope for you to review at your convenience later.  YOU SHOULD EXPECT: Some feelings of bloating in the abdomen. Passage of more gas than usual.  Walking can help get rid of the air that was put into your GI tract during the procedure and reduce the bloating. If you had a lower endoscopy (such as a colonoscopy or flexible sigmoidoscopy) you may notice spotting of blood in your stool or on the toilet paper. If you underwent a bowel prep for your procedure, you may not have a normal bowel movement for a few days.  Please Note:  You might notice some irritation and congestion in your nose or some drainage.  This is from the oxygen used during your procedure.  There is no need for concern and it should clear up in a day or so.  SYMPTOMS TO REPORT IMMEDIATELY:   Following lower endoscopy (colonoscopy or flexible sigmoidoscopy):  Excessive amounts of blood in the stool  Significant tenderness or worsening of abdominal pains  Swelling of the abdomen that is new, acute  Fever of 100F or higher  For urgent or emergent issues, a gastroenterologist can be reached at any hour by calling [phone].  DIET:  We do recommend a small meal at first, but then you may proceed to your regular diet.  Drink plenty of fluids but you should avoid alcoholic beverages for 24 hours.  ACTIVITY:  You should plan to take it easy for the rest of today and you should NOT DRIVE or use heavy machinery until tomorrow (because of the sedation medicines used during the test).     FOLLOW UP: Our staff will call the number listed on your records the next business day following your procedure to check on you and address any questions or concerns that you may have regarding the information given to you following your procedure. If we do not reach you, we will leave a message.  However, if you are feeling well and you are not experiencing any problems, there is no need to return our call.  We will assume that you have returned to your regular daily activities without incident. We will be calling you two weeks following your procedure to see if you have developed any symptoms of the COVID-19.  If you develop any symptoms before then, please let us know.  If any biopsies were taken you will be contacted by phone or by letter within the next 1-3 weeks.  Please call us at [phone] if you have not heard about the biopsies in 3 weeks.   SIGNATURES/CONFIDENTIALITY: You and/or your care partner have signed paperwork which will be entered into your electronic medical record.  These signatures attest to the fact that that the information above on your After Visit Summary has been reviewed and is understood.  Full responsibility of the confidentiality of this discharge information lies with you and/or your care-partner.  Please read over handout about polyps and diverticulosis and high fiber diets  Continue your normal medications   Await pathology

## 2018-08-23 ENCOUNTER — Telehealth: Payer: Self-pay | Admitting: *Deleted

## 2018-08-23 NOTE — Telephone Encounter (Signed)
  Follow up Call-  Call back number 08/21/2018  Post procedure Call Back phone  # [phone]  Permission to leave phone message Yes  Some recent data might be hidden     Patient questions:  Do you have a fever, pain , or abdominal swelling? No. Pain Score  0 *  Have you tolerated food without any problems? Yes.    Have you been able to return to your normal activities? Yes.    Do you have any questions about your discharge instructions: Diet   No. Medications  No. Follow up visit  No.  Do you have questions or concerns about your Care? No.  Actions: * If pain score is 4 or above: No action needed, pain <4.   1. Have you developed a fever since your procedure? no  2.   Have you had an respiratory symptoms (SOB or cough) since your procedure? no  3.   Have you tested positive for COVID 19 since your procedure no  3.   Have you had any family members/close contacts diagnosed with the COVID 19 since your procedure?  no   If any of these questions are a yes, please inquire if patient has been seen by family doctor and route this note to Joylene John, Therapist, sports.

## 2018-08-29 DIAGNOSIS — C61 Malignant neoplasm of prostate: Secondary | ICD-10-CM | POA: Diagnosis not present

## 2018-09-04 ENCOUNTER — Other Ambulatory Visit: Payer: Self-pay | Admitting: Family Medicine

## 2018-09-05 DIAGNOSIS — N401 Enlarged prostate with lower urinary tract symptoms: Secondary | ICD-10-CM | POA: Diagnosis not present

## 2018-09-05 DIAGNOSIS — C61 Malignant neoplasm of prostate: Secondary | ICD-10-CM | POA: Diagnosis not present

## 2018-09-05 DIAGNOSIS — R350 Frequency of micturition: Secondary | ICD-10-CM | POA: Diagnosis not present

## 2018-09-08 ENCOUNTER — Other Ambulatory Visit: Payer: Self-pay | Admitting: Family Medicine

## 2018-09-10 ENCOUNTER — Other Ambulatory Visit: Payer: Self-pay | Admitting: Family Medicine

## 2018-09-10 DIAGNOSIS — M542 Cervicalgia: Secondary | ICD-10-CM | POA: Diagnosis not present

## 2018-09-10 DIAGNOSIS — E1149 Type 2 diabetes mellitus with other diabetic neurological complication: Secondary | ICD-10-CM

## 2018-09-11 ENCOUNTER — Other Ambulatory Visit: Payer: Medicare Other

## 2018-09-13 ENCOUNTER — Other Ambulatory Visit (INDEPENDENT_AMBULATORY_CARE_PROVIDER_SITE_OTHER): Payer: Medicare Other

## 2018-09-13 ENCOUNTER — Other Ambulatory Visit: Payer: Self-pay

## 2018-09-13 DIAGNOSIS — I10 Essential (primary) hypertension: Secondary | ICD-10-CM | POA: Diagnosis not present

## 2018-09-13 DIAGNOSIS — E1165 Type 2 diabetes mellitus with hyperglycemia: Secondary | ICD-10-CM | POA: Diagnosis not present

## 2018-09-13 DIAGNOSIS — E1169 Type 2 diabetes mellitus with other specified complication: Secondary | ICD-10-CM

## 2018-09-13 DIAGNOSIS — E1139 Type 2 diabetes mellitus with other diabetic ophthalmic complication: Secondary | ICD-10-CM

## 2018-09-13 DIAGNOSIS — E785 Hyperlipidemia, unspecified: Secondary | ICD-10-CM

## 2018-09-13 DIAGNOSIS — IMO0002 Reserved for concepts with insufficient information to code with codable children: Secondary | ICD-10-CM

## 2018-09-13 LAB — LIPID PANEL
Cholesterol: 128 mg/dL (ref 0–200)
HDL: 39.7 mg/dL (ref >39.00)
LDL Cholesterol: 74 mg/dL (ref 0–99)
NonHDL: 88.59
Total CHOL/HDL Ratio: 3
Triglycerides: 71 mg/dL (ref 0.0–149.0)
VLDL: 14.2 mg/dL (ref 0.0–40.0)

## 2018-09-13 LAB — COMPREHENSIVE METABOLIC PANEL
ALT: 9 U/L (ref 0–53)
AST: 9 U/L (ref 0–37)
Albumin: 3.9 g/dL (ref 3.5–5.2)
Alkaline Phosphatase: 66 U/L (ref 39–117)
BUN: 13 mg/dL (ref 6–23)
CO2: 28 mEq/L (ref 19–32)
Calcium: 8.8 mg/dL (ref 8.4–10.5)
Chloride: 104 mEq/L (ref 96–112)
Creatinine, Ser: 1.03 mg/dL (ref 0.40–1.50)
GFR: 83.65 mL/min (ref >60.00)
Glucose, Bld: 186 mg/dL — ABNORMAL HIGH (ref 70–99)
Potassium: 4.2 mEq/L (ref 3.5–5.1)
Sodium: 140 mEq/L (ref 135–145)
Total Bilirubin: 0.6 mg/dL (ref 0.2–1.2)
Total Protein: 6.1 g/dL (ref 6.0–8.3)

## 2018-09-13 LAB — HEMOGLOBIN A1C: Hgb A1c MFr Bld: 8.8 % — ABNORMAL HIGH (ref 4.6–6.5)

## 2018-09-26 ENCOUNTER — Other Ambulatory Visit: Payer: Self-pay

## 2018-09-26 DIAGNOSIS — IMO0002 Reserved for concepts with insufficient information to code with codable children: Secondary | ICD-10-CM

## 2018-09-26 DIAGNOSIS — I10 Essential (primary) hypertension: Secondary | ICD-10-CM

## 2018-09-26 DIAGNOSIS — E1169 Type 2 diabetes mellitus with other specified complication: Secondary | ICD-10-CM

## 2018-09-26 DIAGNOSIS — E1139 Type 2 diabetes mellitus with other diabetic ophthalmic complication: Secondary | ICD-10-CM

## 2018-09-26 DIAGNOSIS — E785 Hyperlipidemia, unspecified: Secondary | ICD-10-CM

## 2018-09-26 MED ORDER — DAPAGLIFLOZIN PROPANEDIOL 5 MG PO TABS: 5.0000 mg | ORAL_TABLET | Freq: Every day | ORAL | 2 refills | Status: DC

## 2018-09-26 NOTE — Progress Notes (Signed)
Pt called. Verbalized understanding. Medication sent, orders placed.

## 2018-10-17 DIAGNOSIS — C61 Malignant neoplasm of prostate: Secondary | ICD-10-CM | POA: Diagnosis not present

## 2018-10-17 DIAGNOSIS — N401 Enlarged prostate with lower urinary tract symptoms: Secondary | ICD-10-CM | POA: Diagnosis not present

## 2018-10-17 DIAGNOSIS — R351 Nocturia: Secondary | ICD-10-CM | POA: Diagnosis not present

## 2018-11-07 DIAGNOSIS — H401131 Primary open-angle glaucoma, bilateral, mild stage: Secondary | ICD-10-CM | POA: Diagnosis not present

## 2018-11-08 ENCOUNTER — Other Ambulatory Visit: Payer: Self-pay | Admitting: Family Medicine

## 2018-11-08 DIAGNOSIS — E1149 Type 2 diabetes mellitus with other diabetic neurological complication: Secondary | ICD-10-CM

## 2018-11-12 DIAGNOSIS — E119 Type 2 diabetes mellitus without complications: Secondary | ICD-10-CM | POA: Diagnosis not present

## 2018-11-12 DIAGNOSIS — H2513 Age-related nuclear cataract, bilateral: Secondary | ICD-10-CM | POA: Diagnosis not present

## 2018-11-12 DIAGNOSIS — H524 Presbyopia: Secondary | ICD-10-CM | POA: Diagnosis not present

## 2018-11-12 DIAGNOSIS — H401131 Primary open-angle glaucoma, bilateral, mild stage: Secondary | ICD-10-CM | POA: Diagnosis not present

## 2018-11-16 ENCOUNTER — Ambulatory Visit (INDEPENDENT_AMBULATORY_CARE_PROVIDER_SITE_OTHER): Payer: Medicare Other | Admitting: Family Medicine

## 2018-11-16 ENCOUNTER — Other Ambulatory Visit: Payer: Self-pay

## 2018-11-16 ENCOUNTER — Other Ambulatory Visit: Payer: Self-pay | Admitting: Family Medicine

## 2018-11-16 VITALS — BP 128/70 | HR 83 | Temp 98.1°F | Resp 16 | Ht 65.0 in | Wt 172.0 lb

## 2018-11-16 DIAGNOSIS — E1165 Type 2 diabetes mellitus with hyperglycemia: Secondary | ICD-10-CM | POA: Diagnosis not present

## 2018-11-16 DIAGNOSIS — R8290 Unspecified abnormal findings in urine: Secondary | ICD-10-CM | POA: Diagnosis not present

## 2018-11-16 DIAGNOSIS — IMO0002 Reserved for concepts with insufficient information to code with codable children: Secondary | ICD-10-CM

## 2018-11-16 DIAGNOSIS — B379 Candidiasis, unspecified: Secondary | ICD-10-CM | POA: Diagnosis not present

## 2018-11-16 DIAGNOSIS — E1151 Type 2 diabetes mellitus with diabetic peripheral angiopathy without gangrene: Secondary | ICD-10-CM

## 2018-11-16 LAB — POC URINALSYSI DIPSTICK (AUTOMATED)
Bilirubin, UA: NEGATIVE
Blood, UA: NEGATIVE
Glucose, UA: NEGATIVE
Ketones, UA: NEGATIVE
Nitrite, UA: NEGATIVE
Protein, UA: POSITIVE — AB
Spec Grav, UA: 1.02 (ref 1.010–1.025)
Urobilinogen, UA: 0.2 E.U./dL
pH, UA: 5.5 (ref 5.0–8.0)

## 2018-11-16 MED ORDER — SITAGLIPTIN PHOSPHATE 100 MG PO TABS: 100.0000 mg | ORAL_TABLET | Freq: Every day | ORAL | 2 refills | Status: DC

## 2018-11-16 MED ORDER — FLUCONAZOLE 100 MG PO TABS: 100.0000 mg | ORAL_TABLET | Freq: Every day | ORAL | 0 refills | Status: DC

## 2018-11-16 NOTE — Progress Notes (Signed)
Patient ID: [name] [name], male    DOB: November 09, 1934  Age: 84 y.o. MRN: [id number]    Subjective:  Subjective  HPI [name] [name] presents for swelling and itching in penis after starting farxiga.  He stopped it on Monday and it is better   Review of Systems  Constitutional: Negative for chills and fever.  HENT: Negative for congestion and hearing loss.   Eyes: Negative for discharge.  Respiratory: Negative for cough and shortness of breath.   Cardiovascular: Negative for chest pain, palpitations and leg swelling.  Gastrointestinal: Negative for abdominal pain, blood in stool, constipation, diarrhea, nausea and vomiting.  Genitourinary: Positive for penile pain and penile swelling. Negative for dysuria, frequency, genital sores, hematuria, scrotal swelling, testicular pain and urgency.  Musculoskeletal: Negative for back pain and myalgias.  Skin: Negative for rash.  Allergic/Immunologic: Negative for environmental allergies.  Neurological: Negative for dizziness, weakness and headaches.  Hematological: Does not bruise/bleed easily.  Psychiatric/Behavioral: Negative for suicidal ideas. The patient is not nervous/anxious.     History Past Medical History:  Diagnosis Date  . Diabetes mellitus 1955   type 2 - fasting 100-120  . Diverticulosis 2009  . GERD (gastroesophageal reflux disease)   . Glaucoma    bilateral  . History of shingles   . Hyperlipidemia    5 yrs  . Hypertension   . 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 Osteoarthritis    "knees" (01/31/2014)  . Prostate cancer ([place])    "no treatments; it's going away by itself w/the pills I take"    He has a past surgical history that includes Colonoscopy; Total knee arthroplasty (Right, 01/31/2014); Tonsillectomy; Total knee arthroplasty (Right, 01/31/2014); Joint replacement; Fine needle aspiration (Left, 06/10/15); Fine needle aspiration (05/26/15); and Anterior cervical decomp/discectomy fusion  (N/A, 09/03/2015).   His family history includes Arthritis in an other family member; Cancer in an other family member; Colon cancer (age of onset: 37) in his daughter; Diabetes in an other family member; Heart disease in his brother.He reports that he quit smoking about 23 years ago. His smoking use included cigars. He quit after 2.00 years of use. He has never used smokeless tobacco. He reports that he does not drink alcohol or use drugs.  Current Outpatient Medications on File Prior to Visit  Medication Sig Dispense Refill  . acetaminophen (TYLENOL) 500 MG tablet Take 500 mg by mouth every 6 (six) hours as needed.    Marland Kitchen aspirin 81 MG chewable tablet Chew by mouth daily.    Marland Kitchen atorvastatin (LIPITOR) 20 MG tablet TAKE 1 TABLET BY MOUTH EVERY DAY 90 tablet 2  . Coenzyme Q10 (CO Q 10 PO) Take 50 mg by mouth daily.    . dapagliflozin propanediol (FARXIGA) 5 MG TABS tablet Take 5 mg by mouth daily. 1 tablet daily 30 tablet 2  . finasteride (PROSCAR) 5 MG tablet Take 5 mg by mouth daily.    . furosemide (LASIX) 20 MG tablet Take 1 tablet (20 mg total) by mouth every other day. 90 tablet 3  . gabapentin (NEURONTIN) 600 MG tablet TAKE 1 TABLET BY MOUTH THREE TIMES A DAY 270 tablet 1  . glipiZIDE (GLUCOTROL) 10 MG tablet Take 1 tablet (10 mg total) by mouth 2 (two) times daily before a meal. 90 tablet 1  . glucose blood (ONETOUCH VERIO) test strip Use twice daily to check blood sugar.  DX E11.9 100 each 1  . latanoprost (XALATAN) 0.005 % ophthalmic  solution Place 1 drop into both eyes at bedtime.    . metFORMIN (GLUCOPHAGE) 1000 MG tablet TAKE 1 TABLET BY MOUTH 2 TIMES DAILY WITH A MEAL 180 tablet 0  . methocarbamol (ROBAXIN) 500 MG tablet Take 1 tablet by mouth daily.  0  . nateglinide (STARLIX) 60 MG tablet TAKE 1 TABLET (60 MG TOTAL) BY MOUTH 3 (THREE) TIMES DAILY WITH MEALS. 270 tablet 0  . Omega-3 Fatty Acids (FISH OIL) 1000 MG CAPS Take 1 capsule by mouth 2 (two) times daily.    Marland Kitchen omeprazole  (PRILOSEC) 20 MG capsule TAKE 1 CAPSULE BY MOUTH EVERY DAY 90 capsule 2  . ramipril (ALTACE) 2.5 MG capsule TAKE 1 CAPSULE BY MOUTH EVERY DAY 90 capsule 2  . traMADol (ULTRAM) 50 MG tablet Take 1 tablet by mouth daily.  0  . tamsulosin (FLOMAX) 0.4 MG CAPS capsule TAKE 1 CAPSULE BY MOUTH EVERYDAY AT BEDTIME     No current facility-administered medications on file prior to visit.      Objective:  Objective  Physical Exam Vitals signs and nursing note reviewed.  Constitutional:      General: He is sleeping.     Appearance: He is well-developed.  HENT:     Head: Normocephalic and atraumatic.  Eyes:     Pupils: Pupils are equal, round, and reactive to light.  Neck:     Musculoskeletal: Normal range of motion and neck supple.     Thyroid: No thyromegaly.  Cardiovascular:     Rate and Rhythm: Normal rate and regular rhythm.     Heart sounds: No murmur.  Pulmonary:     Effort: Pulmonary effort is normal. No respiratory distress.     Breath sounds: Normal breath sounds. No wheezing or rales.  Chest:     Chest wall: No tenderness.  Genitourinary:    Penis: Erythema and swelling present. No discharge or lesions.      Scrotum/Testes: Normal.  Musculoskeletal:        General: No tenderness.  Skin:    General: Skin is warm and dry.  Neurological:     Mental Status: He is oriented to person, place, and time.  Psychiatric:        Behavior: Behavior normal.        Thought Content: Thought content normal.        Judgment: Judgment normal.    BP 128/70 (BP Location: Left Arm, Patient Position: Sitting, Cuff Size: Normal)   Pulse 83   Temp 98.1 F (36.7 C) (Oral)   Resp 16   Ht 5\' 5"  (1.651 m)   Wt 172 lb (78 kg)   SpO2 99%   BMI 28.62 kg/m  Wt Readings from Last 3 Encounters:  11/16/18 172 lb (78 kg)  08/21/18 175 lb (79.4 kg)  06/18/18 171 lb 12.8 oz (77.9 kg)     Lab Results  Component Value Date   WBC 3.9 (L) 10/27/2016   HGB 12.1 (L) 10/27/2016   HCT 36.2 (L)  10/27/2016   PLT 278.0 10/27/2016   GLUCOSE 186 (H) 09/13/2018   CHOL 128 09/13/2018   TRIG 71.0 09/13/2018   HDL 39.70 09/13/2018   LDLCALC 74 09/13/2018   ALT 9 09/13/2018   AST 9 09/13/2018   NA 140 09/13/2018   K 4.2 09/13/2018   CL 104 09/13/2018   CREATININE 1.03 09/13/2018   BUN 13 09/13/2018   CO2 28 09/13/2018   TSH 1.89 08/17/2010   PSA 1.55 04/11/2014   INR  0.95 08/31/2015   HGBA1C 8.8 (H) 09/13/2018   MICROALBUR 0.9 06/30/2015    Dg Chest 2 View  Result Date: 08/31/2015 CLINICAL DATA:  Preoperative evaluation for cervical fusion EXAM: CHEST  2 VIEW COMPARISON:  01/31/2014 FINDINGS: Cardiac shadow is within normal limits. The lungs are well aerated bilaterally. No focal infiltrate or sizable effusion is seen. No acute bony abnormality is noted. IMPRESSION: No active cardiopulmonary disease. Electronically Signed   By: Inez Catalina M.D.   On: 08/31/2015 10:04     Assessment & Plan:  Plan  I am having [name] [name] start on fluconazole and sitaGLIPtin. I am also having him maintain his finasteride, latanoprost, Coenzyme Q10 (CO Q 10 PO), traMADol, methocarbamol, Fish Oil, furosemide, glucose blood, ramipril, atorvastatin, omeprazole, acetaminophen, aspirin, glipiZIDE, metFORMIN, nateglinide, dapagliflozin propanediol, gabapentin, and tamsulosin.  Meds ordered this encounter  Medications  . fluconazole (DIFLUCAN) 100 MG tablet    Sig: Take 1 tablet (100 mg total) by mouth daily.    Dispense:  7 tablet    Refill:  0  . sitaGLIPtin (JANUVIA) 100 MG tablet    Sig: Take 1 tablet (100 mg total) by mouth daily.    Dispense:  30 tablet    Refill:  2    Problem List Items Addressed This Visit    None    Visit Diagnoses    Bad odor of urine    -  Primary   Relevant Orders   POCT Urinalysis Dipstick (Automated) (Completed)   Urine Culture   Candidiasis       Relevant Medications   fluconazole (DIFLUCAN) 100 MG tablet   Uncontrolled type 2 diabetes mellitus with  hyperglycemia (HCC)       Relevant Medications   sitaGLIPtin (JANUVIA) 100 MG tablet      Follow-up: No follow-ups on file.  Ann Held, DO

## 2018-11-16 NOTE — Patient Instructions (Signed)
Genital Yeast Infection, Male In men, a genital yeast infection is a condition that causes soreness, swelling, and redness (inflammation) of the head of the penis (glans penis). A genital yeast infection can be spread through sexual contact, but it can also develop without sexual contact. If the infection is not treated properly, it is likely to come back. What are the causes? This condition is caused by a change in the normal balance of the yeast and bacteria that live on the skin. This change causes an overgrowth of yeast, which causes the inflammation. Many types of yeast can cause this infection, but Candida is the most common. What increases the risk? The following factors may make you more likely to develop this condition:  Taking antibiotics.  Having diabetes.  Being exposed to the infection by a sexual partner.  Being uncircumcised.  Having a weak body defense system (immune system).  Taking steroid medicines for a long time.  Having poor hygiene. What are the signs or symptoms? Symptoms of this condition include:  Itching of the groin and penis.  Dry, red, or cracked skin on the penis.  Swelling of the genital area.  Pain while urinating or difficulty urinating.  Thick, bad-smelling discharge on the penis. How is this diagnosed? This condition may be diagnosed based on:  Your medical history.  A physical exam. You may also have tests, such as:  Test of a sample of discharge from the penis.  Urine tests.  Blood tests. How is this treated? This condition is treated with:  Anti-fungal creams or medicines. Anti-fungal medicines may be prescribed by your health care provider or they may be available over-the-counter.  Self-care at home. For men who are not circumcised, circumcision may be recommended to control infections that return and are difficult to treat. Follow these instructions at home: Medicines   Take or apply over-the-counter and prescription  medicines only as told by your health care provider.  Take your anti-fungal medicine as told by your health care provider. Do not stop taking the medicine even if you start to feel better. Self care  Wash your penis with soap and water every day. If you are not circumcised, pull back the foreskin to wash. Make sure to dry your penis completely after washing.  Wear breathable, cotton underwear.  Keep your underwear clean and dry. General instructions  Do not have sex until your health care provider has approved. Tell your sexual partner that you have a yeast infection. That person should go for treatment even if no symptoms are present.  If you have diabetes, keep your blood sugar levels within your target range.  Keep all follow-up visits as told by your health care provider. This is important. Contact a health care provider if you:  Have a fever.  Have symptoms that go away and then return.  Do not get better with treatment.  Have symptoms that get worse.  Have new symptoms. Get help right away if:  Your swelling and inflammation become so severe that you cannot urinate. Summary  In men, a genital yeast infection is a condition that causes soreness, swelling, and redness (inflammation) of the head of the penis (glans penis).  This condition is caused by a change in the normal balance of the yeast and bacteria that live on the skin. This change causes an overgrowth of yeast, which causes the inflammation.  A genital yeast infection usually spreads through sexual contact, but it can develop without sexual contact. For instance, you may   be more likely to develop this infection if you take antibiotics or steroids, have diabetes, are not circumcised, have a weak immune system, or have poor hygiene.  This condition is treated with anti-fungal cream or pills along with self-care at home. This information is not intended to replace advice given to you by your health care provider.  Make sure you discuss any questions you have with your health care provider. Document Released: 05/05/2004 Document Revised: 05/02/2017 Document Reviewed: 05/02/2017 Elsevier Patient Education  2020 [employer].

## 2018-11-17 ENCOUNTER — Encounter: Payer: Self-pay | Admitting: Family Medicine

## 2018-11-17 ENCOUNTER — Other Ambulatory Visit: Payer: Self-pay | Admitting: Family Medicine

## 2018-11-17 DIAGNOSIS — IMO0002 Reserved for concepts with insufficient information to code with codable children: Secondary | ICD-10-CM

## 2018-11-17 DIAGNOSIS — E1139 Type 2 diabetes mellitus with other diabetic ophthalmic complication: Secondary | ICD-10-CM

## 2018-11-17 LAB — URINE CULTURE
MICRO NUMBER:: 748676
SPECIMEN QUALITY:: ADEQUATE

## 2018-11-20 ENCOUNTER — Other Ambulatory Visit: Payer: Self-pay | Admitting: Family Medicine

## 2018-11-20 DIAGNOSIS — E1151 Type 2 diabetes mellitus with diabetic peripheral angiopathy without gangrene: Secondary | ICD-10-CM

## 2018-11-20 DIAGNOSIS — IMO0002 Reserved for concepts with insufficient information to code with codable children: Secondary | ICD-10-CM

## 2018-11-26 ENCOUNTER — Other Ambulatory Visit: Payer: Self-pay | Admitting: Family Medicine

## 2018-12-03 NOTE — Progress Notes (Deleted)
Virtual Visit via Video Note  I connected with patient on 12/04/18 at  2:00 PM EDT by audio enabled telemedicine application and verified that I am speaking with the correct person using two identifiers.   THIS ENCOUNTER IS A VIRTUAL VISIT DUE TO COVID-19 - PATIENT WAS NOT SEEN IN THE OFFICE. PATIENT HAS CONSENTED TO VIRTUAL VISIT / TELEMEDICINE VISIT   Location of patient: home  Location of provider: office  I discussed the limitations of evaluation and management by telemedicine and the availability of in person appointments. The patient expressed understanding and agreed to proceed.   Subjective:   [name] [name] is a 84 y.o. male who presents for Medicare Annual/Subsequent preventive examination.  Review of Systems:    Home Safety/Smoke Alarms: Feels safe in home. Smoke alarms in place.   Male:   CCS-  08/21/18   PSA-  Lab Results  Component Value Date   PSA 1.55 04/11/2014   PSA 3.43 06/08/2011   PSA 3.27 08/17/2010       Objective:    Vitals:  Please see vitals from OV this morning.   Advanced Directives 06/18/2018 06/13/2016 09/03/2015 08/31/2015 06/23/2015 01/31/2014 01/23/2014  Does Patient Have a Medical Advance Directive? Yes Yes Yes Yes Yes No No  Type of Advance Directive Living will;Healthcare Power of [place];Living will [place];Living will [place];Living will [place];Living will - -  Does patient want to make changes to medical advance directive? - No - Patient declined No - Patient declined No - Patient declined No - Patient declined - -  Copy of [place] in Chart? - No - copy requested No - copy requested No - copy requested No - copy requested - -  Would patient like information on creating a medical advance directive? - - - - - No - patient declined information Yes Higher education careers adviser given    Tobacco Social History   Tobacco Use  Smoking  Status Former Smoker  . Years: 2.00  . Types: Cigars  . Quit date: 04/12/1995  . Years since quitting: 23.6  Smokeless Tobacco Never Used  Tobacco Comment   chews cigars still      Counseling given: Not Answered Comment: chews cigars still    Clinical Intake:                       Past Medical History:  Diagnosis Date  . Diabetes mellitus 1955   type 2 - fasting 100-120  . Diverticulosis 2009  . GERD (gastroesophageal reflux disease)   . Glaucoma    bilateral  . History of shingles   . Hyperlipidemia    5 yrs  . Hypertension   . 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 Osteoarthritis    "knees" (01/31/2014)  . Prostate cancer ([place])    "no treatments; it's going away by itself w/the pills I take"   Past Surgical History:  Procedure Laterality Date  . ANTERIOR CERVICAL DECOMP/DISCECTOMY FUSION N/A 09/03/2015   Procedure: ANTERIOR CERVICAL DECOMPRESSION FUSION, CERVICAL 5-6 WITH INSTRUMENTATION AND ALLOGRAFT;  Surgeon: Phylliss Bob, MD;  Location: [facility];  Service: Orthopedics;  Laterality: N/A;  ANTERIOR CERVICAL DECOMPRESSION FUSION, CERVICAL 5-6 WITH INSTRUMENTATION AND ALLOGRAFT  . COLONOSCOPY    . FINE NEEDLE ASPIRATION Left 06/10/15   Knee  . FINE NEEDLE ASPIRATION  05/26/15   Back  . JOINT REPLACEMENT    .  TONSILLECTOMY    . TOTAL KNEE ARTHROPLASTY Right 01/31/2014  . TOTAL KNEE ARTHROPLASTY Right 01/31/2014   Procedure: RIGHT TOTAL KNEE ARTHROPLASTY;  Surgeon: Kerin Salen, MD;  Location: [facility];  Service: Orthopedics;  Laterality: Right;   Family History  Problem Relation Age of Onset  . Arthritis Other   . Diabetes Other   . Cancer Other        prostate  . Heart disease Brother        questionable  . Colon cancer Daughter 68  . Colon polyps Neg Hx   . Esophageal cancer Neg Hx   . Rectal cancer Neg Hx   . Stomach cancer Neg Hx    Social History   Socioeconomic History  . Marital status: Widowed    Spouse  name: Not on file  . Number of children: Not on file  . Years of education: Not on file  . Highest education level: Not on file  Occupational History    Comment: retired Biomedical scientist     Comment: Restaurant manager, fast food  Social Needs  . Financial resource strain: Not on file  . Food insecurity    Worry: Not on file    Inability: Not on file  . Transportation needs    Medical: Not on file    Non-medical: Not on file  Tobacco Use  . Smoking status: Former Smoker    Years: 2.00    Types: Cigars    Quit date: 04/12/1995    Years since quitting: 23.6  . Smokeless tobacco: Never Used  . Tobacco comment: chews cigars still   Substance and Sexual Activity  . Alcohol use: No  . Drug use: No  . Sexual activity: Never  Lifestyle  . Physical activity    Days per week: Not on file    Minutes per session: Not on file  . Stress: Not on file  Relationships  . Social Herbalist on phone: Not on file    Gets together: Not on file    Attends religious service: Not on file    Active member of club or organization: Not on file    Attends meetings of clubs or organizations: Not on file    Relationship status: Not on file  Other Topics Concern  . Not on file  Social History Narrative   Works out in the garden    Outpatient Encounter Medications as of 12/04/2018  Medication Sig  . acetaminophen (TYLENOL) 500 MG tablet Take 500 mg by mouth every 6 (six) hours as needed.  Marland Kitchen aspirin 81 MG chewable tablet Chew by mouth daily.  Marland Kitchen atorvastatin (LIPITOR) 20 MG tablet TAKE 1 TABLET BY MOUTH EVERY DAY  . Coenzyme Q10 (CO Q 10 PO) Take 50 mg by mouth daily.  Marland Kitchen FARXIGA 5 MG TABS tablet TAKE 1 TABLET EVERY DAY  . finasteride (PROSCAR) 5 MG tablet Take 5 mg by mouth daily.  . fluconazole (DIFLUCAN) 100 MG tablet Take 1 tablet (100 mg total) by mouth daily.  . furosemide (LASIX) 20 MG tablet Take 1 tablet (20 mg total) by mouth every other day.  . gabapentin (NEURONTIN) 600 MG tablet TAKE 1 TABLET BY MOUTH  THREE TIMES A DAY  . glipiZIDE (GLUCOTROL) 10 MG tablet TAKE 1 TABLET (10 MG TOTAL) BY MOUTH 2 (TWO) TIMES DAILY BEFORE A MEAL.  Marland Kitchen glucose blood (ONETOUCH VERIO) test strip Use twice daily to check blood sugar.  DX E11.9  . latanoprost (XALATAN) 0.005 % ophthalmic  solution Place 1 drop into both eyes at bedtime.  . metFORMIN (GLUCOPHAGE) 1000 MG tablet TAKE 1 TABLET BY MOUTH 2 TIMES DAILY WITH A MEAL  . methocarbamol (ROBAXIN) 500 MG tablet Take 1 tablet by mouth daily.  . nateglinide (STARLIX) 60 MG tablet TAKE 1 TABLET (60 MG TOTAL) BY MOUTH 3 (THREE) TIMES DAILY WITH MEALS.  Marland Kitchen Omega-3 Fatty Acids (FISH OIL) 1000 MG CAPS Take 1 capsule by mouth 2 (two) times daily.  Marland Kitchen omeprazole (PRILOSEC) 20 MG capsule TAKE 1 CAPSULE BY MOUTH EVERY DAY  . ramipril (ALTACE) 2.5 MG capsule TAKE 1 CAPSULE BY MOUTH EVERY DAY  . sitaGLIPtin (JANUVIA) 100 MG tablet Take 1 tablet (100 mg total) by mouth daily.  . tamsulosin (FLOMAX) 0.4 MG CAPS capsule TAKE 1 CAPSULE BY MOUTH EVERYDAY AT BEDTIME  . traMADol (ULTRAM) 50 MG tablet Take 1 tablet by mouth daily.   No facility-administered encounter medications on file as of 12/04/2018.     Activities of Daily Living No flowsheet data found.  Patient Care Team: Carollee Herter, Alferd Apa, DO as PCP - General (Family Medicine) Festus Aloe, MD as Consulting Physician (Urology) Marylynn Pearson, MD as Consulting Physician (Ophthalmology) Frederik Pear, MD as Consulting Physician (Orthopedic Surgery) Neldon Newport as Physician Assistant (Orthopedic Surgery) Normajean Glasgow, MD as Attending Physician (Physical Medicine and Rehabilitation) Phylliss Bob, MD as Consulting Physician (Orthopedic Surgery)   Assessment:   This is a routine wellness examination for [employer]. Physical assessment deferred to PCP.  Exercise Activities and Dietary recommendations   Diet (meal preparation, eat out, water intake, caffeinated beverages, dairy products, fruits and vegetables):  {Desc; diets:16563} Breakfast: Lunch:  Dinner:      Goals   None     Fall Risk Fall Risk  02/27/2018 12/26/2016 11/01/2016 06/23/2015 05/01/2014  Falls in the past year? 0 No No No No  Comment Emmi Telephone Survey: data to providers prior to load - Emmi Telephone Survey: data to providers prior to load - -     Depression Screen PHQ 2/9 Scores 05/31/2018 12/26/2016 06/23/2015 05/01/2014  PHQ - 2 Score 0 0 0 0  PHQ- 9 Score 0 - - -    Cognitive Function MMSE - Mini Mental State Exam 06/13/2016  Orientation to time 5  Orientation to Place 5  Registration 3  Attention/ Calculation 5  Recall 3  Language- name 2 objects 2  Language- repeat 1  Language- follow 3 step command 3  Language- read & follow direction 1  Write a sentence 1  Copy design 1  Total score 30        Immunization History  Administered Date(s) Administered  . Influenza Split 12/21/2011  . Influenza Whole 12/19/2007, 01/16/2009, 01/27/2010  . Influenza, High Dose Seasonal PF 12/15/2014, 02/21/2018  . Influenza-Unspecified 01/01/2014, 12/24/2016  . Pneumococcal Conjugate-13 05/01/2014, 09/16/2014  . Pneumococcal Polysaccharide-23 12/19/2007  . Td 10/16/2007  . Zoster Recombinat (Shingrix) 07/06/2016, 10/05/2016    Screening Tests Health Maintenance  Topic Date Due  . TETANUS/TDAP  10/15/2017  . OPHTHALMOLOGY EXAM  05/02/2018  . INFLUENZA VACCINE  11/10/2018  . FOOT EXAM  11/29/2018  . HEMOGLOBIN A1C  03/15/2019  . PNA vac Low Risk Adult  Completed      Plan:   ***  I have personally reviewed and noted the following in the patient's chart:   . Medical and social history . Use of alcohol, tobacco or illicit drugs  . Current medications and supplements . Functional ability and  status . Nutritional status . Physical activity . Advanced directives . List of other physicians . Hospitalizations, surgeries, and ER visits in previous 12 months . Vitals . Screenings to include cognitive,  depression, and falls . Referrals and appointments  In addition, I have reviewed and discussed with patient certain preventive protocols, quality metrics, and best practice recommendations. A written personalized care plan for preventive services as well as general preventive health recommendations were provided to patient.     Naaman Plummer [place], [place]  12/03/2018

## 2018-12-04 ENCOUNTER — Ambulatory Visit (INDEPENDENT_AMBULATORY_CARE_PROVIDER_SITE_OTHER): Payer: Medicare Other | Admitting: Family Medicine

## 2018-12-04 ENCOUNTER — Encounter: Payer: Self-pay | Admitting: Family Medicine

## 2018-12-04 ENCOUNTER — Ambulatory Visit: Payer: Medicare Other | Admitting: *Deleted

## 2018-12-04 ENCOUNTER — Other Ambulatory Visit: Payer: Self-pay | Admitting: Family Medicine

## 2018-12-04 ENCOUNTER — Other Ambulatory Visit: Payer: Self-pay

## 2018-12-04 ENCOUNTER — Telehealth: Payer: Self-pay | Admitting: Family Medicine

## 2018-12-04 VITALS — BP 130/60 | HR 78 | Temp 97.7°F | Resp 18 | Ht 65.0 in | Wt 171.0 lb

## 2018-12-04 DIAGNOSIS — E1139 Type 2 diabetes mellitus with other diabetic ophthalmic complication: Secondary | ICD-10-CM

## 2018-12-04 DIAGNOSIS — I10 Essential (primary) hypertension: Secondary | ICD-10-CM | POA: Diagnosis not present

## 2018-12-04 DIAGNOSIS — IMO0002 Reserved for concepts with insufficient information to code with codable children: Secondary | ICD-10-CM

## 2018-12-04 DIAGNOSIS — E1165 Type 2 diabetes mellitus with hyperglycemia: Secondary | ICD-10-CM | POA: Diagnosis not present

## 2018-12-04 DIAGNOSIS — E785 Hyperlipidemia, unspecified: Secondary | ICD-10-CM | POA: Diagnosis not present

## 2018-12-04 DIAGNOSIS — E1169 Type 2 diabetes mellitus with other specified complication: Secondary | ICD-10-CM

## 2018-12-04 DIAGNOSIS — Z23 Encounter for immunization: Secondary | ICD-10-CM | POA: Diagnosis not present

## 2018-12-04 LAB — COMPREHENSIVE METABOLIC PANEL
ALT: 10 U/L (ref 0–53)
AST: 11 U/L (ref 0–37)
Albumin: 4.2 g/dL (ref 3.5–5.2)
Alkaline Phosphatase: 60 U/L (ref 39–117)
BUN: 15 mg/dL (ref 6–23)
CO2: 31 mEq/L (ref 19–32)
Calcium: 9.2 mg/dL (ref 8.4–10.5)
Chloride: 102 mEq/L (ref 96–112)
Creatinine, Ser: 1.14 mg/dL (ref 0.40–1.50)
GFR: 74.37 mL/min (ref >60.00)
Glucose, Bld: 184 mg/dL — ABNORMAL HIGH (ref 70–99)
Potassium: 4.8 mEq/L (ref 3.5–5.1)
Sodium: 138 mEq/L (ref 135–145)
Total Bilirubin: 0.4 mg/dL (ref 0.2–1.2)
Total Protein: 6.1 g/dL (ref 6.0–8.3)

## 2018-12-04 LAB — LIPID PANEL
Cholesterol: 123 mg/dL (ref 0–200)
HDL: 40.6 mg/dL (ref >39.00)
LDL Cholesterol: 68 mg/dL (ref 0–99)
NonHDL: 82.31
Total CHOL/HDL Ratio: 3
Triglycerides: 72 mg/dL (ref 0.0–149.0)
VLDL: 14.4 mg/dL (ref 0.0–40.0)

## 2018-12-04 LAB — HEMOGLOBIN A1C: Hgb A1c MFr Bld: 7.9 % — ABNORMAL HIGH (ref 4.6–6.5)

## 2018-12-04 MED ORDER — SITAGLIPTIN PHOSPHATE 100 MG PO TABS: 100.0000 mg | ORAL_TABLET | Freq: Every day | ORAL | 2 refills | Status: DC

## 2018-12-04 NOTE — Patient Instructions (Signed)
Carbohydrate Counting for Diabetes Mellitus, Adult  Carbohydrate counting is a method of keeping track of how many carbohydrates you eat. Eating carbohydrates naturally increases the amount of sugar (glucose) in the blood. Counting how many carbohydrates you eat helps keep your blood glucose within normal limits, which helps you manage your diabetes (diabetes mellitus). It is important to know how many carbohydrates you can safely have in each meal. This is different for every person. A diet and nutrition specialist (registered dietitian) can help you make a meal plan and calculate how many carbohydrates you should have at each meal and snack. Carbohydrates are found in the following foods:  Grains, such as breads and cereals.  Dried beans and soy products.  Starchy vegetables, such as potatoes, peas, and corn.  Fruit and fruit juices.  Milk and yogurt.  Sweets and snack foods, such as cake, cookies, candy, chips, and soft drinks. How do I count carbohydrates? There are two ways to count carbohydrates in food. You can use either of the methods or a combination of both. Reading "Nutrition Facts" on packaged food The "Nutrition Facts" list is included on the labels of almost all packaged foods and beverages in the [place] It includes:  The serving size.  Information about nutrients in each serving, including the grams (g) of carbohydrate per serving. To use the "Nutrition Facts":  Decide how many servings you will have.  Multiply the number of servings by the number of carbohydrates per serving.  The resulting number is the total amount of carbohydrates that you will be having. Learning standard serving sizes of other foods When you eat carbohydrate foods that are not packaged or do not include "Nutrition Facts" on the label, you need to measure the servings in order to count the amount of carbohydrates:  Measure the foods that you will eat with a food scale or measuring cup, if needed.   Decide how many standard-size servings you will eat.  Multiply the number of servings by 15. Most carbohydrate-rich foods have about 15 g of carbohydrates per serving. ? For example, if you eat 8 oz (170 g) of strawberries, you will have eaten 2 servings and 30 g of carbohydrates (2 servings x 15 g = 30 g).  For foods that have more than one food mixed, such as soups and casseroles, you must count the carbohydrates in each food that is included. The following list contains standard serving sizes of common carbohydrate-rich foods. Each of these servings has about 15 g of carbohydrates:   hamburger bun or  English muffin.   oz (15 mL) syrup.   oz (14 g) jelly.  1 slice of bread.  1 six-inch tortilla.  3 oz (85 g) cooked rice or pasta.  4 oz (113 g) cooked dried beans.  4 oz (113 g) starchy vegetable, such as peas, corn, or potatoes.  4 oz (113 g) hot cereal.  4 oz (113 g) mashed potatoes or  of a large baked potato.  4 oz (113 g) canned or frozen fruit.  4 oz (120 mL) fruit juice.  4-6 crackers.  6 chicken nuggets.  6 oz (170 g) unsweetened dry cereal.  6 oz (170 g) plain fat-free yogurt or yogurt sweetened with artificial sweeteners.  8 oz (240 mL) milk.  8 oz (170 g) fresh fruit or one small piece of fruit.  24 oz (680 g) popped popcorn. Example of carbohydrate counting Sample meal  3 oz (85 g) chicken breast.  6 oz (170 g)   brown rice.  4 oz (113 g) corn.  8 oz (240 mL) milk.  8 oz (170 g) strawberries with sugar-free whipped topping. Carbohydrate calculation 1. Identify the foods that contain carbohydrates: ? Rice. ? Corn. ? Milk. ? Strawberries. 2. Calculate how many servings you have of each food: ? 2 servings rice. ? 1 serving corn. ? 1 serving milk. ? 1 serving strawberries. 3. Multiply each number of servings by 15 g: ? 2 servings rice x 15 g = 30 g. ? 1 serving corn x 15 g = 15 g. ? 1 serving milk x 15 g = 15 g. ? 1 serving  strawberries x 15 g = 15 g. 4. Add together all of the amounts to find the total grams of carbohydrates eaten: ? 30 g + 15 g + 15 g + 15 g = 75 g of carbohydrates total. Summary  Carbohydrate counting is a method of keeping track of how many carbohydrates you eat.  Eating carbohydrates naturally increases the amount of sugar (glucose) in the blood.  Counting how many carbohydrates you eat helps keep your blood glucose within normal limits, which helps you manage your diabetes.  A diet and nutrition specialist (registered dietitian) can help you make a meal plan and calculate how many carbohydrates you should have at each meal and snack. This information is not intended to replace advice given to you by your health care provider. Make sure you discuss any questions you have with your health care provider. Document Released: 03/28/2005 Document Revised: 10/20/2016 Document Reviewed: 09/09/2015 Elsevier Patient Education  2020 Elsevier Inc.  

## 2018-12-04 NOTE — Assessment & Plan Note (Signed)
Tolerating statin, encouraged heart healthy diet, avoid trans fats, minimize simple carbs and saturated fats. Increase exercise as tolerated 

## 2018-12-04 NOTE — Progress Notes (Signed)
Patient ID: [name] [name], male    DOB: February 21, 1935  Age: 84 y.o. MRN: [id number]    Subjective:  Subjective  HPI [name] [name] presents for f/u dm chol and bp.   He originally thought the farxiga was causing the yeast but he started it again and has had no problems.   He thought it was going to be too expensive but it was actually the [place] that was $300 ----- he would like to stay on the farxiga.   Review of Systems  Constitutional: Negative for appetite change, diaphoresis, fatigue and unexpected weight change.  Eyes: Negative for pain, redness and visual disturbance.  Respiratory: Negative for cough, chest tightness, shortness of breath and wheezing.   Cardiovascular: Negative for chest pain, palpitations and leg swelling.  Endocrine: Negative for cold intolerance, heat intolerance, polydipsia, polyphagia and polyuria.  Genitourinary: Negative for difficulty urinating, dysuria and frequency.  Neurological: Negative for dizziness, light-headedness, numbness and headaches.    History Past Medical History:  Diagnosis Date  . Diabetes mellitus 1955   type 2 - fasting 100-120  . Diverticulosis 2009  . GERD (gastroesophageal reflux disease)   . Glaucoma    bilateral  . History of shingles   . Hyperlipidemia    5 yrs  . Hypertension   . 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 Osteoarthritis    "knees" (01/31/2014)  . Prostate cancer ([place])    "no treatments; it's going away by itself w/the pills I take"    He has a past surgical history that includes Colonoscopy; Total knee arthroplasty (Right, 01/31/2014); Tonsillectomy; Total knee arthroplasty (Right, 01/31/2014); Joint replacement; Fine needle aspiration (Left, 06/10/15); Fine needle aspiration (05/26/15); and Anterior cervical decomp/discectomy fusion (N/A, 09/03/2015).   His family history includes Arthritis in an other family member; Cancer in an other family member; Colon cancer (age of onset: 55)  in his daughter; Diabetes in an other family member; Heart disease in his brother.He reports that he quit smoking about 23 years ago. His smoking use included cigars. He quit after 2.00 years of use. He has never used smokeless tobacco. He reports that he does not drink alcohol or use drugs.  Current Outpatient Medications on File Prior to Visit  Medication Sig Dispense Refill  . acetaminophen (TYLENOL) 500 MG tablet Take 500 mg by mouth every 6 (six) hours as needed.    Marland Kitchen aspirin 81 MG chewable tablet Chew by mouth daily.    Marland Kitchen atorvastatin (LIPITOR) 20 MG tablet TAKE 1 TABLET BY MOUTH EVERY DAY 90 tablet 2  . Coenzyme Q10 (CO Q 10 PO) Take 50 mg by mouth daily.    . finasteride (PROSCAR) 5 MG tablet Take 5 mg by mouth daily.    . furosemide (LASIX) 20 MG tablet Take 1 tablet (20 mg total) by mouth every other day. 90 tablet 3  . gabapentin (NEURONTIN) 600 MG tablet TAKE 1 TABLET BY MOUTH THREE TIMES A DAY 270 tablet 1  . glipiZIDE (GLUCOTROL) 10 MG tablet TAKE 1 TABLET (10 MG TOTAL) BY MOUTH 2 (TWO) TIMES DAILY BEFORE A MEAL. 180 tablet 0  . glucose blood (ONETOUCH VERIO) test strip Use twice daily to check blood sugar.  DX E11.9 100 each 1  . latanoprost (XALATAN) 0.005 % ophthalmic solution Place 1 drop into both eyes at bedtime.    . metFORMIN (GLUCOPHAGE) 1000 MG tablet TAKE 1 TABLET BY MOUTH 2 TIMES DAILY WITH A MEAL 180 tablet 3  .  methocarbamol (ROBAXIN) 500 MG tablet Take 1 tablet by mouth daily.  0  . Omega-3 Fatty Acids (FISH OIL) 1000 MG CAPS Take 1 capsule by mouth 2 (two) times daily.    Marland Kitchen omeprazole (PRILOSEC) 20 MG capsule TAKE 1 CAPSULE BY MOUTH EVERY DAY 90 capsule 2  . ramipril (ALTACE) 2.5 MG capsule TAKE 1 CAPSULE BY MOUTH EVERY DAY 90 capsule 2  . tamsulosin (FLOMAX) 0.4 MG CAPS capsule TAKE 1 CAPSULE BY MOUTH EVERYDAY AT BEDTIME    . traMADol (ULTRAM) 50 MG tablet Take 1 tablet by mouth daily.  0  . fluconazole (DIFLUCAN) 100 MG tablet Take 1 tablet (100 mg total) by mouth  daily. (Patient not taking: Reported on 12/04/2018) 7 tablet 0   No current facility-administered medications on file prior to visit.      Objective:  Objective  Physical Exam Vitals signs and nursing note reviewed.  Constitutional:      General: He is sleeping.     Appearance: He is well-developed.  HENT:     Head: Normocephalic and atraumatic.  Eyes:     Pupils: Pupils are equal, round, and reactive to light.  Neck:     Musculoskeletal: Normal range of motion and neck supple.     Thyroid: No thyromegaly.  Cardiovascular:     Rate and Rhythm: Normal rate and regular rhythm.     Heart sounds: No murmur.  Pulmonary:     Effort: Pulmonary effort is normal. No respiratory distress.     Breath sounds: Normal breath sounds. No wheezing or rales.  Chest:     Chest wall: No tenderness.  Musculoskeletal:        General: No tenderness.  Skin:    General: Skin is warm and dry.  Neurological:     Mental Status: He is oriented to person, place, and time.  Psychiatric:        Behavior: Behavior normal.        Thought Content: Thought content normal.        Judgment: Judgment normal.    BP 130/60 (BP Location: Right Arm, Patient Position: Sitting, Cuff Size: Normal)   Pulse 78   Temp 97.7 F (36.5 C) (Temporal)   Resp 18   Ht 5\' 5"  (1.651 m)   Wt 171 lb (77.6 kg)   SpO2 98%   BMI 28.46 kg/m  Wt Readings from Last 3 Encounters:  12/04/18 171 lb (77.6 kg)  11/16/18 172 lb (78 kg)  08/21/18 175 lb (79.4 kg)     Lab Results  Component Value Date   WBC 3.9 (L) 10/27/2016   HGB 12.1 (L) 10/27/2016   HCT 36.2 (L) 10/27/2016   PLT 278.0 10/27/2016   GLUCOSE 186 (H) 09/13/2018   CHOL 128 09/13/2018   TRIG 71.0 09/13/2018   HDL 39.70 09/13/2018   LDLCALC 74 09/13/2018   ALT 9 09/13/2018   AST 9 09/13/2018   NA 140 09/13/2018   K 4.2 09/13/2018   CL 104 09/13/2018   CREATININE 1.03 09/13/2018   BUN 13 09/13/2018   CO2 28 09/13/2018   TSH 1.89 08/17/2010   PSA 1.55  04/11/2014   INR 0.95 08/31/2015   HGBA1C 8.8 (H) 09/13/2018   MICROALBUR 0.9 06/30/2015    Dg Chest 2 View  Result Date: 08/31/2015 CLINICAL DATA:  Preoperative evaluation for cervical fusion EXAM: CHEST  2 VIEW COMPARISON:  01/31/2014 FINDINGS: Cardiac shadow is within normal limits. The lungs are well aerated bilaterally. No focal infiltrate  or sizable effusion is seen. No acute bony abnormality is noted. IMPRESSION: No active cardiopulmonary disease. Electronically Signed   By: Inez Catalina M.D.   On: 08/31/2015 10:04     Assessment & Plan:  Plan  I have discontinued Lissa Merlin Westhoff's sitaGLIPtin, Farxiga, and sitaGLIPtin. I am also having him maintain his finasteride, latanoprost, Coenzyme Q10 (CO Q 10 PO), traMADol, methocarbamol, Fish Oil, furosemide, glucose blood, ramipril, atorvastatin, omeprazole, acetaminophen, aspirin, gabapentin, tamsulosin, fluconazole, glipiZIDE, metFORMIN, and nateglinide.  Meds ordered this encounter  Medications  . DISCONTD: sitaGLIPtin (JANUVIA) 100 MG tablet    Sig: Take 1 tablet (100 mg total) by mouth daily.    Dispense:  30 tablet    Refill:  2    Problem List Items Addressed This Visit      Unprioritized   DM (diabetes mellitus) type II uncontrolled with eye manifestation ([place])    hgba1c to be checked , minimize simple carbs. Increase exercise as tolerated. Continue current meds       Essential hypertension    Well controlled, no changes to meds. Encouraged heart healthy diet such as the DASH diet and exercise as tolerated.        Relevant Orders   Comprehensive metabolic panel   Hyperlipidemia LDL goal <70    Tolerating statin, encouraged heart healthy diet, avoid trans fats, minimize simple carbs and saturated fats. Increase exercise as tolerated       Other Visit Diagnoses    Need for influenza vaccination    -  Primary   Relevant Orders   Flu Vaccine QUAD High Dose(Fluad) (Completed)   Uncontrolled type 2 diabetes mellitus with  hyperglycemia ([place])       Relevant Orders   Hemoglobin A1c   Comprehensive metabolic panel   Hyperlipidemia associated with type 2 diabetes mellitus ([place])       Relevant Orders   Lipid panel   Comprehensive metabolic panel      Follow-up: Return in about 6 months (around 06/06/2019) for hypertension, hyperlipidemia, diabetes II.  Ann Held, DO

## 2018-12-04 NOTE — Assessment & Plan Note (Signed)
hgba1c to be checked, minimize simple carbs. Increase exercise as tolerated. Continue current meds  

## 2018-12-04 NOTE — Telephone Encounter (Signed)
LVM to schedule " Return in about 6 months (around 06/06/2019) for hypertension, hyperlipidemia, diabetes II. " appointment.

## 2018-12-04 NOTE — Assessment & Plan Note (Signed)
Well controlled, no changes to meds. Encouraged heart healthy diet such as the DASH diet and exercise as tolerated.  °

## 2018-12-09 ENCOUNTER — Other Ambulatory Visit: Payer: Self-pay | Admitting: Family Medicine

## 2018-12-09 DIAGNOSIS — E1169 Type 2 diabetes mellitus with other specified complication: Secondary | ICD-10-CM

## 2018-12-09 DIAGNOSIS — E1165 Type 2 diabetes mellitus with hyperglycemia: Secondary | ICD-10-CM

## 2018-12-12 ENCOUNTER — Encounter: Payer: Self-pay | Admitting: *Deleted

## 2018-12-27 ENCOUNTER — Other Ambulatory Visit: Payer: Self-pay | Admitting: *Deleted

## 2018-12-27 MED ORDER — ATORVASTATIN CALCIUM 20 MG PO TABS: 20.0000 mg | ORAL_TABLET | Freq: Every day | ORAL | 1 refills | Status: DC

## 2018-12-27 MED ORDER — OMEPRAZOLE 20 MG PO CPDR: DELAYED_RELEASE_CAPSULE | ORAL | 1 refills | Status: DC

## 2018-12-27 NOTE — Addendum Note (Signed)
Addended by: Kem Boroughs D on: 12/27/2018 04:14 PM   Modules accepted: Orders

## 2019-03-11 DIAGNOSIS — M5412 Radiculopathy, cervical region: Secondary | ICD-10-CM | POA: Diagnosis not present

## 2019-04-01 ENCOUNTER — Other Ambulatory Visit: Payer: Self-pay | Admitting: Family Medicine

## 2019-04-01 DIAGNOSIS — IMO0002 Reserved for concepts with insufficient information to code with codable children: Secondary | ICD-10-CM

## 2019-04-01 DIAGNOSIS — E1151 Type 2 diabetes mellitus with diabetic peripheral angiopathy without gangrene: Secondary | ICD-10-CM

## 2019-04-24 ENCOUNTER — Other Ambulatory Visit: Payer: Self-pay

## 2019-04-25 ENCOUNTER — Ambulatory Visit (INDEPENDENT_AMBULATORY_CARE_PROVIDER_SITE_OTHER): Payer: Medicare HMO | Admitting: Family Medicine

## 2019-04-25 ENCOUNTER — Other Ambulatory Visit: Payer: Self-pay

## 2019-04-25 ENCOUNTER — Encounter: Payer: Self-pay | Admitting: Family Medicine

## 2019-04-25 VITALS — BP 130/60 | HR 105 | Temp 97.2°F | Resp 18 | Ht 65.0 in | Wt 169.4 lb

## 2019-04-25 DIAGNOSIS — E1165 Type 2 diabetes mellitus with hyperglycemia: Secondary | ICD-10-CM

## 2019-04-25 DIAGNOSIS — I10 Essential (primary) hypertension: Secondary | ICD-10-CM | POA: Diagnosis not present

## 2019-04-25 DIAGNOSIS — E1151 Type 2 diabetes mellitus with diabetic peripheral angiopathy without gangrene: Secondary | ICD-10-CM | POA: Diagnosis not present

## 2019-04-25 DIAGNOSIS — E785 Hyperlipidemia, unspecified: Secondary | ICD-10-CM | POA: Diagnosis not present

## 2019-04-25 DIAGNOSIS — Z23 Encounter for immunization: Secondary | ICD-10-CM | POA: Diagnosis not present

## 2019-04-25 DIAGNOSIS — IMO0002 Reserved for concepts with insufficient information to code with codable children: Secondary | ICD-10-CM

## 2019-04-25 DIAGNOSIS — E1169 Type 2 diabetes mellitus with other specified complication: Secondary | ICD-10-CM | POA: Insufficient documentation

## 2019-04-25 DIAGNOSIS — E1139 Type 2 diabetes mellitus with other diabetic ophthalmic complication: Secondary | ICD-10-CM | POA: Diagnosis not present

## 2019-04-25 LAB — HEMOGLOBIN A1C: Hgb A1c MFr Bld: 10.3 % — ABNORMAL HIGH (ref 4.6–6.5)

## 2019-04-25 LAB — COMPREHENSIVE METABOLIC PANEL
ALT: 10 U/L (ref 0–53)
AST: 10 U/L (ref 0–37)
Albumin: 4.1 g/dL (ref 3.5–5.2)
Alkaline Phosphatase: 79 U/L (ref 39–117)
BUN: 15 mg/dL (ref 6–23)
CO2: 29 mEq/L (ref 19–32)
Calcium: 9 mg/dL (ref 8.4–10.5)
Chloride: 100 mEq/L (ref 96–112)
Creatinine, Ser: 1.12 mg/dL (ref 0.40–1.50)
GFR: 75.83 mL/min (ref >60.00)
Glucose, Bld: 264 mg/dL — ABNORMAL HIGH (ref 70–99)
Potassium: 4 mEq/L (ref 3.5–5.1)
Sodium: 138 mEq/L (ref 135–145)
Total Bilirubin: 0.7 mg/dL (ref 0.2–1.2)
Total Protein: 6.2 g/dL (ref 6.0–8.3)

## 2019-04-25 LAB — LIPID PANEL
Cholesterol: 141 mg/dL (ref 0–200)
HDL: 37.9 mg/dL — ABNORMAL LOW (ref >39.00)
LDL Cholesterol: 82 mg/dL (ref 0–99)
NonHDL: 103.45
Total CHOL/HDL Ratio: 4
Triglycerides: 106 mg/dL (ref 0.0–149.0)
VLDL: 21.2 mg/dL (ref 0.0–40.0)

## 2019-04-25 MED ORDER — METFORMIN HCL 1000 MG PO TABS: ORAL_TABLET | ORAL | 3 refills | Status: DC

## 2019-04-25 NOTE — Assessment & Plan Note (Signed)
hgba1c to be checked, minimize simple carbs. Increase exercise as tolerated. Continue current meds  

## 2019-04-25 NOTE — Patient Instructions (Signed)
DASH Eating Plan DASH stands for "Dietary Approaches to Stop Hypertension." The DASH eating plan is a healthy eating plan that has been shown to reduce high blood pressure (hypertension). It may also reduce your risk for type 2 diabetes, heart disease, and stroke. The DASH eating plan may also help with weight loss. What are tips for following this plan?  General guidelines  Avoid eating more than 2,300 mg (milligrams) of salt (sodium) a day. If you have hypertension, you may need to reduce your sodium intake to 1,500 mg a day.  Limit alcohol intake to no more than 1 drink a day for nonpregnant women and 2 drinks a day for men. One drink equals 12 oz of beer, 5 oz of wine, or 1 oz of hard liquor.  Work with your health care provider to maintain a healthy body weight or to lose weight. Ask what an ideal weight is for you.  Get at least 30 minutes of exercise that causes your heart to beat faster (aerobic exercise) most days of the week. Activities may include walking, swimming, or biking.  Work with your health care provider or diet and nutrition specialist (dietitian) to adjust your eating plan to your individual calorie needs. Reading food labels   Check food labels for the amount of sodium per serving. Choose foods with less than 5 percent of the Daily Value of sodium. Generally, foods with less than 300 mg of sodium per serving fit into this eating plan.  To find whole grains, look for the word "whole" as the first word in the ingredient list. Shopping  Buy products labeled as "low-sodium" or "no salt added."  Buy fresh foods. Avoid canned foods and premade or frozen meals. Cooking  Avoid adding salt when cooking. Use salt-free seasonings or herbs instead of table salt or sea salt. Check with your health care provider or pharmacist before using salt substitutes.  Do not fry foods. Cook foods using healthy methods such as baking, boiling, grilling, and broiling instead.  Cook with  heart-healthy oils, such as olive, canola, soybean, or sunflower oil. Meal planning  Eat a balanced diet that includes: ? 5 or more servings of fruits and vegetables each day. At each meal, try to fill half of your plate with fruits and vegetables. ? Up to 6-8 servings of whole grains each day. ? Less than 6 oz of lean meat, poultry, or fish each day. A 3-oz serving of meat is about the same size as a deck of cards. One egg equals 1 oz. ? 2 servings of low-fat dairy each day. ? A serving of nuts, seeds, or beans 5 times each week. ? Heart-healthy fats. Healthy fats called Omega-3 fatty acids are found in foods such as flaxseeds and coldwater fish, like sardines, salmon, and mackerel.  Limit how much you eat of the following: ? Canned or prepackaged foods. ? Food that is high in trans fat, such as fried foods. ? Food that is high in saturated fat, such as fatty meat. ? Sweets, desserts, sugary drinks, and other foods with added sugar. ? Full-fat dairy products.  Do not salt foods before eating.  Try to eat at least 2 vegetarian meals each week.  Eat more home-cooked food and less restaurant, buffet, and fast food.  When eating at a restaurant, ask that your food be prepared with less salt or no salt, if possible. What foods are recommended? The items listed may not be a complete list. Talk with your dietitian about   what dietary choices are best for you. Grains Whole-grain or whole-wheat bread. Whole-grain or whole-wheat pasta. Brown rice. Oatmeal. Quinoa. Bulgur. Whole-grain and low-sodium cereals. Pita bread. Low-fat, low-sodium crackers. Whole-wheat flour tortillas. Vegetables Fresh or frozen vegetables (raw, steamed, roasted, or grilled). Low-sodium or reduced-sodium tomato and vegetable juice. Low-sodium or reduced-sodium tomato sauce and tomato paste. Low-sodium or reduced-sodium canned vegetables. Fruits All fresh, dried, or frozen fruit. Canned fruit in natural juice (without  added sugar). Meat and other protein foods Skinless chicken or turkey. Ground chicken or turkey. Pork with fat trimmed off. Fish and seafood. Egg whites. Dried beans, peas, or lentils. Unsalted nuts, nut butters, and seeds. Unsalted canned beans. Lean cuts of beef with fat trimmed off. Low-sodium, lean deli meat. Dairy Low-fat (1%) or fat-free (skim) milk. Fat-free, low-fat, or reduced-fat cheeses. Nonfat, low-sodium ricotta or cottage cheese. Low-fat or nonfat yogurt. Low-fat, low-sodium cheese. Fats and oils Soft margarine without trans fats. Vegetable oil. Low-fat, reduced-fat, or light mayonnaise and salad dressings (reduced-sodium). Canola, safflower, olive, soybean, and sunflower oils. Avocado. Seasoning and other foods Herbs. Spices. Seasoning mixes without salt. Unsalted popcorn and pretzels. Fat-free sweets. What foods are not recommended? The items listed may not be a complete list. Talk with your dietitian about what dietary choices are best for you. Grains Baked goods made with fat, such as croissants, muffins, or some breads. Dry pasta or rice meal packs. Vegetables Creamed or fried vegetables. Vegetables in a cheese sauce. Regular canned vegetables (not low-sodium or reduced-sodium). Regular canned tomato sauce and paste (not low-sodium or reduced-sodium). Regular tomato and vegetable juice (not low-sodium or reduced-sodium). Pickles. Olives. Fruits Canned fruit in a light or heavy syrup. Fried fruit. Fruit in cream or butter sauce. Meat and other protein foods Fatty cuts of meat. Ribs. Fried meat. Bacon. Sausage. Bologna and other processed lunch meats. Salami. Fatback. Hotdogs. Bratwurst. Salted nuts and seeds. Canned beans with added salt. Canned or smoked fish. Whole eggs or egg yolks. Chicken or turkey with skin. Dairy Whole or 2% milk, cream, and half-and-half. Whole or full-fat cream cheese. Whole-fat or sweetened yogurt. Full-fat cheese. Nondairy creamers. Whipped toppings.  Processed cheese and cheese spreads. Fats and oils Butter. Stick margarine. Lard. Shortening. Ghee. Bacon fat. Tropical oils, such as coconut, palm kernel, or palm oil. Seasoning and other foods Salted popcorn and pretzels. Onion salt, garlic salt, seasoned salt, table salt, and sea salt. Worcestershire sauce. Tartar sauce. Barbecue sauce. Teriyaki sauce. Soy sauce, including reduced-sodium. Steak sauce. Canned and packaged gravies. Fish sauce. Oyster sauce. Cocktail sauce. Horseradish that you find on the shelf. Ketchup. Mustard. Meat flavorings and tenderizers. Bouillon cubes. Hot sauce and Tabasco sauce. Premade or packaged marinades. Premade or packaged taco seasonings. Relishes. Regular salad dressings. Where to find more information:  National Heart, Lung, and Blood Institute: www.nhlbi.nih.gov  American Heart Association: www.heart.org Summary  The DASH eating plan is a healthy eating plan that has been shown to reduce high blood pressure (hypertension). It may also reduce your risk for type 2 diabetes, heart disease, and stroke.  With the DASH eating plan, you should limit salt (sodium) intake to 2,300 mg a day. If you have hypertension, you may need to reduce your sodium intake to 1,500 mg a day.  When on the DASH eating plan, aim to eat more fresh fruits and vegetables, whole grains, lean proteins, low-fat dairy, and heart-healthy fats.  Work with your health care provider or diet and nutrition specialist (dietitian) to adjust your eating plan to your   individual calorie needs. This information is not intended to replace advice given to you by your health care provider. Make sure you discuss any questions you have with your health care provider. Document Revised: 03/10/2017 Document Reviewed: 03/21/2016 Elsevier Patient Education  2020 [employer] Inc.  

## 2019-04-25 NOTE — Assessment & Plan Note (Signed)
Tolerating statin, encouraged heart healthy diet, avoid trans fats, minimize simple carbs and saturated fats. Increase exercise as tolerated 

## 2019-04-25 NOTE — Progress Notes (Signed)
Patient ID: [name] [name], male    DOB: January 08, 1935  Age: 85 y.o. MRN: [id number]    Subjective:  Subjective  HPI Alpheus Merriman presents for f/u dm, chol and htn   HYPERTENSION   Blood pressure range-not checking   Chest pain- no      Dyspnea- no Lightheadedness- no   Edema- no  Other side effects - no   Medication compliance: yes Low salt diet- yes    DIABETES    Blood Sugar ranges- under 290  Polyuria- no New Visual problems- no  Hypoglycemic symptoms- no  Other side effects-no Medication compliance - good Last eye exam- due-- app 2/11 Foot exam- today   HYPERLIPIDEMIA  Medication compliance- good RUQ pain- no  Muscle aches- no Other side effects-no      Review of Systems  Constitutional: Negative for appetite change, diaphoresis, fatigue and unexpected weight change.  Eyes: Negative for pain, redness and visual disturbance.  Respiratory: Negative for cough, chest tightness, shortness of breath and wheezing.   Cardiovascular: Negative for chest pain, palpitations and leg swelling.  Endocrine: Negative for cold intolerance, heat intolerance, polydipsia, polyphagia and polyuria.  Genitourinary: Negative for difficulty urinating, dysuria and frequency.  Neurological: Negative for dizziness, light-headedness, numbness and headaches.    History Past Medical History:  Diagnosis Date  . Diabetes mellitus 1955   type 2 - fasting 100-120  . Diverticulosis 2009  . GERD (gastroesophageal reflux disease)   . Glaucoma    bilateral  . History of shingles   . Hyperlipidemia    5 yrs  . Hypertension   . 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 Osteoarthritis    "knees" (01/31/2014)  . Prostate cancer ([place])    "no treatments; it's going away by itself w/the pills I take"    He has a past surgical history that includes Colonoscopy; Total knee arthroplasty (Right, 01/31/2014); Tonsillectomy; Total knee arthroplasty (Right, 01/31/2014); Joint  replacement; Fine needle aspiration (Left, 06/10/15); Fine needle aspiration (05/26/15); and Anterior cervical decomp/discectomy fusion (N/A, 09/03/2015).   His family history includes Arthritis in an other family member; Cancer in an other family member; Colon cancer (age of onset: 5) in his daughter; Diabetes in an other family member; Heart disease in his brother.He reports that he quit smoking about 24 years ago. His smoking use included cigars. He quit after 2.00 years of use. He has never used smokeless tobacco. He reports that he does not drink alcohol or use drugs.  Current Outpatient Medications on File Prior to Visit  Medication Sig Dispense Refill  . acetaminophen (TYLENOL) 500 MG tablet Take 500 mg by mouth every 6 (six) hours as needed.    Marland Kitchen aspirin 81 MG chewable tablet Chew by mouth daily.    Marland Kitchen atorvastatin (LIPITOR) 20 MG tablet Take 1 tablet (20 mg total) by mouth daily. 90 tablet 1  . Coenzyme Q10 (CO Q 10 PO) Take 50 mg by mouth daily.    . finasteride (PROSCAR) 5 MG tablet Take 5 mg by mouth daily.    . furosemide (LASIX) 20 MG tablet Take 1 tablet (20 mg total) by mouth every other day. 90 tablet 3  . gabapentin (NEURONTIN) 600 MG tablet TAKE 1 TABLET BY MOUTH THREE TIMES A DAY 270 tablet 1  . glipiZIDE (GLUCOTROL) 10 MG tablet TAKE 1 TABLET (10 MG TOTAL) BY MOUTH 2 (TWO) TIMES DAILY BEFORE A MEAL. 180 tablet 0  . glucose blood (ONETOUCH VERIO)  test strip Use twice daily to check blood sugar.  DX E11.9 100 each 1  . latanoprost (XALATAN) 0.005 % ophthalmic solution Place 1 drop into both eyes at bedtime.    . nateglinide (STARLIX) 60 MG tablet TAKE 1 TABLET (60 MG TOTAL) BY MOUTH 3 (THREE) TIMES DAILY WITH MEALS. 270 tablet 0  . Omega-3 Fatty Acids (FISH OIL) 1000 MG CAPS Take 1 capsule by mouth 2 (two) times daily.    Marland Kitchen omeprazole (PRILOSEC) 20 MG capsule TAKE 1 CAPSULE BY MOUTH EVERY DAY 90 capsule 1  . ramipril (ALTACE) 2.5 MG capsule TAKE 1 CAPSULE BY MOUTH EVERY DAY 90  capsule 2  . traMADol (ULTRAM) 50 MG tablet Take 1 tablet by mouth daily.  0   No current facility-administered medications on file prior to visit.     Objective:  Objective  Physical Exam Vitals and nursing note reviewed.  Constitutional:      General: He is sleeping.     Appearance: He is well-developed.  HENT:     Head: Normocephalic and atraumatic.  Eyes:     Pupils: Pupils are equal, round, and reactive to light.  Neck:     Thyroid: No thyromegaly.  Cardiovascular:     Rate and Rhythm: Normal rate and regular rhythm.     Heart sounds: No murmur.  Pulmonary:     Effort: Pulmonary effort is normal. No respiratory distress.     Breath sounds: Normal breath sounds. No wheezing or rales.  Chest:     Chest wall: No tenderness.  Musculoskeletal:        General: No tenderness.     Cervical back: Normal range of motion and neck supple.  Skin:    General: Skin is warm and dry.  Neurological:     Mental Status: He is oriented to person, place, and time.  Psychiatric:        Behavior: Behavior normal.        Thought Content: Thought content normal.        Judgment: Judgment normal.    Diabetic Foot Exam - Simple   Simple Foot Form Diabetic Foot exam was performed with the following findings: Yes 04/25/2019  8:34 AM  Visual Inspection No deformities, no ulcerations, no other skin breakdown bilaterally: Yes Sensation Testing Intact to touch and monofilament testing bilaterally: Yes Pulse Check Posterior Tibialis and Dorsalis pulse intact bilaterally: Yes Comments     BP 130/60 (BP Location: Right Arm, Patient Position: Sitting, Cuff Size: Large)   Pulse (!) 105   Temp (!) 97.2 F (36.2 C) (Temporal)   Resp 18   Ht 5\' 5"  (1.651 m)   Wt 169 lb 6.4 oz (76.8 kg)   SpO2 95%   BMI 28.19 kg/m  Wt Readings from Last 3 Encounters:  04/25/19 169 lb 6.4 oz (76.8 kg)  12/04/18 171 lb (77.6 kg)  11/16/18 172 lb (78 kg)     Lab Results  Component Value Date   WBC 3.9  (L) 10/27/2016   HGB 12.1 (L) 10/27/2016   HCT 36.2 (L) 10/27/2016   PLT 278.0 10/27/2016   GLUCOSE 184 (H) 12/04/2018   CHOL 123 12/04/2018   TRIG 72.0 12/04/2018   HDL 40.60 12/04/2018   LDLCALC 68 12/04/2018   ALT 10 12/04/2018   AST 11 12/04/2018   NA 138 12/04/2018   K 4.8 12/04/2018   CL 102 12/04/2018   CREATININE 1.14 12/04/2018   BUN 15 12/04/2018   CO2 31 12/04/2018  TSH 1.89 08/17/2010   PSA 1.55 04/11/2014   INR 0.95 08/31/2015   HGBA1C 7.9 (H) 12/04/2018   MICROALBUR 0.9 06/30/2015    DG Chest 2 View  Result Date: 08/31/2015 CLINICAL DATA:  Preoperative evaluation for cervical fusion EXAM: CHEST  2 VIEW COMPARISON:  01/31/2014 FINDINGS: Cardiac shadow is within normal limits. The lungs are well aerated bilaterally. No focal infiltrate or sizable effusion is seen. No acute bony abnormality is noted. IMPRESSION: No active cardiopulmonary disease. Electronically Signed   By: Inez Catalina M.D.   On: 08/31/2015 10:04     Assessment & Plan:  Plan  I have discontinued Lissa Merlin Learn's methocarbamol, tamsulosin, and fluconazole. I am also having him maintain his finasteride, latanoprost, Coenzyme Q10 (CO Q 10 PO), traMADol, Fish Oil, furosemide, glucose blood, ramipril, acetaminophen, aspirin, gabapentin, atorvastatin, omeprazole, glipiZIDE, nateglinide, and metFORMIN.  Meds ordered this encounter  Medications  . metFORMIN (GLUCOPHAGE) 1000 MG tablet    Sig: TAKE 1 TABLET BY MOUTH 2 TIMES DAILY WITH A MEAL    Dispense:  180 tablet    Refill:  3    Problem List Items Addressed This Visit      Unprioritized   DM (diabetes mellitus) type II uncontrolled with eye manifestation ([place])    hgba1c to be checked, minimize simple carbs. Increase exercise as tolerated. Continue current meds      Relevant Medications   metFORMIN (GLUCOPHAGE) 1000 MG tablet   Essential hypertension   Relevant Orders   Lipid panel   Comprehensive metabolic panel   Hemoglobin A1c    Hyperlipidemia associated with type 2 diabetes mellitus (HCC)   Relevant Medications   metFORMIN (GLUCOPHAGE) 1000 MG tablet   Other Relevant Orders   Lipid panel   Comprehensive metabolic panel   Hemoglobin A1c   Hyperlipidemia LDL goal <70    Tolerating statin, encouraged heart healthy diet, avoid trans fats, minimize simple carbs and saturated fats. Increase exercise as tolerated      Uncontrolled type 2 diabetes mellitus with hyperglycemia (HCC) - Primary   Relevant Medications   metFORMIN (GLUCOPHAGE) 1000 MG tablet   Other Relevant Orders   Lipid panel   Comprehensive metabolic panel   Hemoglobin A1c    Other Visit Diagnoses    Need for pneumococcal vaccination       Relevant Orders   Pneumococcal vaccine 23 (Completed)      Follow-up: Return in about 6 months (around 10/23/2019) for annual exam, fasting.  Ann Held, DO

## 2019-04-28 ENCOUNTER — Other Ambulatory Visit: Payer: Self-pay | Admitting: Family Medicine

## 2019-04-28 DIAGNOSIS — E1165 Type 2 diabetes mellitus with hyperglycemia: Secondary | ICD-10-CM

## 2019-05-01 MED ORDER — GLIPIZIDE 10 MG PO TABS: 10.0000 mg | ORAL_TABLET | Freq: Two times a day (BID) | ORAL | 0 refills | Status: DC

## 2019-05-01 MED ORDER — NATEGLINIDE 60 MG PO TABS: 60.0000 mg | ORAL_TABLET | Freq: Three times a day (TID) | ORAL | 0 refills | Status: DC

## 2019-05-01 NOTE — Addendum Note (Signed)
Addended by: Sanda Linger on: 05/01/2019 01:04 PM   Modules accepted: Orders

## 2019-05-06 ENCOUNTER — Telehealth: Payer: Self-pay | Admitting: *Deleted

## 2019-05-06 DIAGNOSIS — IMO0002 Reserved for concepts with insufficient information to code with codable children: Secondary | ICD-10-CM

## 2019-05-06 DIAGNOSIS — E1165 Type 2 diabetes mellitus with hyperglycemia: Secondary | ICD-10-CM

## 2019-05-06 DIAGNOSIS — E1151 Type 2 diabetes mellitus with diabetic peripheral angiopathy without gangrene: Secondary | ICD-10-CM

## 2019-05-06 DIAGNOSIS — E1149 Type 2 diabetes mellitus with other diabetic neurological complication: Secondary | ICD-10-CM

## 2019-05-06 DIAGNOSIS — I10 Essential (primary) hypertension: Secondary | ICD-10-CM

## 2019-05-06 MED ORDER — RAMIPRIL 2.5 MG PO CAPS: ORAL_CAPSULE | ORAL | 3 refills | Status: DC

## 2019-05-06 MED ORDER — GABAPENTIN 600 MG PO TABS: 600.0000 mg | ORAL_TABLET | Freq: Three times a day (TID) | ORAL | 3 refills | Status: DC

## 2019-05-06 MED ORDER — METFORMIN HCL 1000 MG PO TABS: ORAL_TABLET | ORAL | 3 refills | Status: DC

## 2019-05-06 MED ORDER — FUROSEMIDE 20 MG PO TABS: 20.0000 mg | ORAL_TABLET | ORAL | 3 refills | Status: DC

## 2019-05-06 MED ORDER — ATORVASTATIN CALCIUM 20 MG PO TABS: 20.0000 mg | ORAL_TABLET | Freq: Every day | ORAL | 3 refills | Status: DC

## 2019-05-06 MED ORDER — GLIPIZIDE 10 MG PO TABS: 10.0000 mg | ORAL_TABLET | Freq: Two times a day (BID) | ORAL | 3 refills | Status: DC

## 2019-05-06 MED ORDER — NATEGLINIDE 60 MG PO TABS: 60.0000 mg | ORAL_TABLET | Freq: Three times a day (TID) | ORAL | 3 refills | Status: DC

## 2019-05-06 MED ORDER — OMEPRAZOLE 20 MG PO CPDR: DELAYED_RELEASE_CAPSULE | ORAL | 3 refills | Status: DC

## 2019-05-06 NOTE — Telephone Encounter (Signed)
Need to know if patient is going to be using [facility] order.  They sent over requests for some old medications and current medications.  Need to know what he needs sent to them.  Some of the rxs we do not write for and some we just sent into local pharmacy.  Spoke with patient and he stated that he will be using [facility].  Advised that some medications need to be sent to specialists.  Will make note and send back to [facility], so they can send to correct place.

## 2019-05-08 DIAGNOSIS — H903 Sensorineural hearing loss, bilateral: Secondary | ICD-10-CM | POA: Diagnosis not present

## 2019-05-23 ENCOUNTER — Other Ambulatory Visit: Payer: Self-pay | Admitting: *Deleted

## 2019-05-23 DIAGNOSIS — H2513 Age-related nuclear cataract, bilateral: Secondary | ICD-10-CM | POA: Diagnosis not present

## 2019-05-23 DIAGNOSIS — H401131 Primary open-angle glaucoma, bilateral, mild stage: Secondary | ICD-10-CM | POA: Diagnosis not present

## 2019-05-23 DIAGNOSIS — E119 Type 2 diabetes mellitus without complications: Secondary | ICD-10-CM | POA: Diagnosis not present

## 2019-05-23 MED ORDER — RAMIPRIL 2.5 MG PO CAPS: ORAL_CAPSULE | ORAL | 3 refills | Status: DC

## 2019-05-30 ENCOUNTER — Other Ambulatory Visit: Payer: Self-pay

## 2019-06-03 ENCOUNTER — Encounter: Payer: Self-pay | Admitting: Endocrinology

## 2019-06-03 ENCOUNTER — Ambulatory Visit (INDEPENDENT_AMBULATORY_CARE_PROVIDER_SITE_OTHER): Payer: Medicare HMO | Admitting: Endocrinology

## 2019-06-03 ENCOUNTER — Other Ambulatory Visit: Payer: Self-pay

## 2019-06-03 VITALS — BP 120/72 | HR 89 | Ht 65.0 in | Wt 174.0 lb

## 2019-06-03 DIAGNOSIS — E1151 Type 2 diabetes mellitus with diabetic peripheral angiopathy without gangrene: Secondary | ICD-10-CM | POA: Diagnosis not present

## 2019-06-03 MED ORDER — ACCU-CHEK AVIVA PLUS VI STRP: 1.0000 | ORAL_STRIP | Freq: Every day | 12 refills | Status: DC

## 2019-06-03 MED ORDER — ACCU-CHEK AVIVA DEVI: 0 refills | Status: AC

## 2019-06-03 MED ORDER — RYBELSUS 3 MG PO TABS: 3.0000 mg | ORAL_TABLET | ORAL | 5 refills | Status: DC

## 2019-06-03 NOTE — Patient Instructions (Addendum)
good diet and exercise significantly improve the control of your diabetes.  please let me know if you wish to be referred to a dietician.  high blood sugar is very risky to your health.  you should see an eye doctor and dentist every year.  It is very important to get all recommended vaccinations.  Controlling your blood pressure and cholesterol drastically reduces the damage diabetes does to your body.  Those who smoke should quit.  Please discuss these with your doctor.  check your blood sugar once a day.  vary the time of day when you check, between before the 3 meals, and at bedtime.  also check if you have symptoms of your blood sugar being too high or too low.  please keep a record of the readings and bring it to your next appointment here (or you can bring the meter itself).  You can write it on any piece of paper.  please call us sooner if your blood sugar goes below 70, or if you have a lot of readings over 200. I have sent a prescription to your pharmacy, for a new meter and strips I have also sent a prescription to your pharmacy, to add Rybelsus. Please come back for a follow-up appointment in 1-2 weeks.  Please see Mickel Baas the same day, to learn about insulin.

## 2019-06-03 NOTE — Progress Notes (Signed)
Subjective:    Patient ID: [name] [name], male    DOB: June 01, 1934, 85 y.o.   MRN: [id number]  HPI pt is referred by Dr Mikle Bosworth, for diabetes.  Pt states DM was dx'ed in [id number]; it is complicated by CRI, PAD, and DR; he has never been on insulin; pt says his diet and exercise are fair; he has never had pancreatitis, pancreatic surgery, severe hypoglycemia or DKA.  He takes 3 oral meds as rx'ed.  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   Past Medical History:  Diagnosis Date  . Diabetes mellitus 1955   type 2 - fasting 100-120  . Diverticulosis 2009  . GERD (gastroesophageal reflux disease)   . Glaucoma    bilateral  . History of shingles   . Hyperlipidemia    5 yrs  . Hypertension   . 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 Osteoarthritis    "knees" (01/31/2014)  . Prostate cancer ([place])    "no treatments; it's going away by itself w/the pills I take"    Past Surgical History:  Procedure Laterality Date  . ANTERIOR CERVICAL DECOMP/DISCECTOMY FUSION N/A 09/03/2015   Procedure: ANTERIOR CERVICAL DECOMPRESSION FUSION, CERVICAL 5-6 WITH INSTRUMENTATION AND ALLOGRAFT;  Surgeon: Phylliss Bob, MD;  Location: [place];  Service: Orthopedics;  Laterality: N/A;  ANTERIOR CERVICAL DECOMPRESSION FUSION, CERVICAL 5-6 WITH INSTRUMENTATION AND ALLOGRAFT  . COLONOSCOPY    . FINE NEEDLE ASPIRATION Left 06/10/15   Knee  . FINE NEEDLE ASPIRATION  05/26/15   Back  . JOINT REPLACEMENT    . TONSILLECTOMY    . TOTAL KNEE ARTHROPLASTY Right 01/31/2014  . TOTAL KNEE ARTHROPLASTY Right 01/31/2014   Procedure: RIGHT TOTAL KNEE ARTHROPLASTY;  Surgeon: Kerin Salen, MD;  Location: [place];  Service: Orthopedics;  Laterality: Right;    Social History   Socioeconomic History  . Marital status: Widowed    Spouse name: Not on file  . Number of children: Not on file  . Years of education: Not on file  . Highest education level: Not on file  Occupational History    Comment: retired  Biomedical scientist     Comment: Restaurant manager, fast food  Tobacco Use  . Smoking status: Former Smoker    Years: 2.00    Types: Cigars    Quit date: 04/12/1995    Years since quitting: 24.1  . Smokeless tobacco: Never Used  . Tobacco comment: chews cigars still   Substance and Sexual Activity  . Alcohol use: No  . Drug use: No  . Sexual activity: Never  Other Topics Concern  . Not on file  Social History Narrative   Works out in the garden   Social Determinants of Radio broadcast assistant Strain:   . Difficulty of Paying Living Expenses: Not on file  Food Insecurity:   . Worried About Charity fundraiser in the Last Year: Not on file  . Ran Out of Food in the Last Year: Not on file  Transportation Needs:   . Lack of Transportation (Medical): Not on file  . Lack of Transportation (Non-Medical): Not on file  Physical Activity:   . Days of Exercise per Week: Not on file  . Minutes of Exercise per Session: Not on file  Stress:   . Feeling of Stress : Not on file  Social Connections:   . Frequency of Communication with Friends and Family: Not on file  . Frequency of Social Gatherings  with Friends and Family: Not on file  . Attends Religious Services: Not on file  . Active Member of Clubs or Organizations: Not on file  . Attends Archivist Meetings: Not on file  . Marital Status: Not on file  Intimate Partner Violence:   . Fear of Current or Ex-Partner: Not on file  . Emotionally Abused: Not on file  . Physically Abused: Not on file  . Sexually Abused: Not on file    Current Outpatient Medications on File Prior to Visit  Medication Sig Dispense Refill  . acetaminophen (TYLENOL) 500 MG tablet Take 500 mg by mouth every 6 (six) hours as needed.    Marland Kitchen aspirin 81 MG chewable tablet Chew by mouth daily.    Marland Kitchen atorvastatin (LIPITOR) 20 MG tablet Take 1 tablet (20 mg total) by mouth daily. 90 tablet 3  . Coenzyme Q10 (CO Q 10 PO) Take 50 mg by mouth daily.    . finasteride (PROSCAR) 5 MG  tablet Take 5 mg by mouth daily.    . furosemide (LASIX) 20 MG tablet Take 1 tablet (20 mg total) by mouth every other day. 90 tablet 3  . gabapentin (NEURONTIN) 600 MG tablet Take 1 tablet (600 mg total) by mouth 3 (three) times daily. 270 tablet 3  . glipiZIDE (GLUCOTROL) 10 MG tablet Take 1 tablet (10 mg total) by mouth 2 (two) times daily before a meal. 180 tablet 3  . latanoprost (XALATAN) 0.005 % ophthalmic solution Place 1 drop into both eyes at bedtime.    . metFORMIN (GLUCOPHAGE) 1000 MG tablet TAKE 1 TABLET BY MOUTH 2 TIMES DAILY WITH A MEAL 180 tablet 3  . nateglinide (STARLIX) 60 MG tablet Take 1 tablet (60 mg total) by mouth 3 (three) times daily with meals. 270 tablet 3  . Omega-3 Fatty Acids (FISH OIL) 1000 MG CAPS Take 1 capsule by mouth 2 (two) times daily.    Marland Kitchen omeprazole (PRILOSEC) 20 MG capsule TAKE 1 CAPSULE BY MOUTH EVERY DAY (Patient taking differently: 2 (two) times daily before a meal. TAKE 1 CAPSULE BY MOUTH EVERY DAY) 90 capsule 3  . ramipril (ALTACE) 2.5 MG capsule TAKE 1 CAPSULE BY MOUTH EVERY DAY 90 capsule 3  . traMADol (ULTRAM) 50 MG tablet Take 1 tablet by mouth daily.  0   No current facility-administered medications on file prior to visit.    No Known Allergies  Family History  Problem Relation Age of Onset  . Arthritis Other   . Diabetes Other   . Cancer Other        prostate  . Heart disease Brother        questionable  . Colon cancer Daughter 66  . Diabetes Mother   . Colon polyps Neg Hx   . Esophageal cancer Neg Hx   . Rectal cancer Neg Hx   . Stomach cancer Neg Hx     BP 120/72 (BP Location: Left Arm, Patient Position: Sitting, Cuff Size: Large)   Pulse 89   Ht 5\' 5"  (1.651 m)   Wt 174 lb (78.9 kg)   SpO2 96%   BMI 28.96 kg/m     Review of Systems denies weight loss, blurry vision, sob, n/v, urinary frequency, memory loss, and depression.  He has frequent urination.      Objective:   Physical Exam VS: see vs page GEN: no  distress HEAD: head: no deformity eyes: no periorbital swelling, no proptosis external nose and ears are normal.  Right  hearing aid NECK: supple, thyroid is not enlarged CHEST WALL: no deformity LUNGS: clear to auscultation CV: reg rate and rhythm, no murmur MUSCULOSKELETAL: muscle bulk and strength are grossly normal.  no obvious joint swelling.  gait is normal and steady EXTEMITIES: no deformity.  no ulcer on the feet, but the skin is dry.  feet are of normal color and temp.  1+ bilat leg edema.  there is bilateral onychomycosis of the toenails.   PULSES: dorsalis pedis intact bilat.  no carotid bruit NEURO:  cn 2-12 grossly intact, except for hearing loss. readily moves all 4's.  sensation is intact to touch on the feet SKIN:  Normal texture and temperature.  No rash or suspicious lesion is visible.   NODES:  None palpable at the neck PSYCH: alert, well-oriented.  Does not appear anxious nor depressed.  Lab Results  Component Value Date   HGBA1C 10.3 (H) 04/25/2019    Lab Results  Component Value Date   CREATININE 1.12 04/25/2019   BUN 15 04/25/2019   NA 138 04/25/2019   K 4.0 04/25/2019   CL 100 04/25/2019   CO2 29 04/25/2019   I have reviewed outside records, and summarized:  Pt was noted to have elevated A1c, and referred here.  HTN, wellness, and dyslipidemia were also addressed.  I personally reviewed electrocardiogram tracing (06/23/15):  Indication: wellness.  Impression: NSR.  No MI.  No hypertrophy.  Compared to 2015: no significant change.       Assessment & Plan:  Type 2 DM, with DR: severe exacerbation.  He declines insulin.  Edema: This limits rx options.  renal insuff: we'll reduce metformin when glycemic control allows.   Patient Instructions  good diet and exercise significantly improve the control of your diabetes.  please let me know if you wish to be referred to a dietician.  high blood sugar is very risky to your health.  you should see an eye  doctor and dentist every year.  It is very important to get all recommended vaccinations.  Controlling your blood pressure and cholesterol drastically reduces the damage diabetes does to your body.  Those who smoke should quit.  Please discuss these with your doctor.  check your blood sugar once a day.  vary the time of day when you check, between before the 3 meals, and at bedtime.  also check if you have symptoms of your blood sugar being too high or too low.  please keep a record of the readings and bring it to your next appointment here (or you can bring the meter itself).  You can write it on any piece of paper.  please call us sooner if your blood sugar goes below 70, or if you have a lot of readings over 200. I have sent a prescription to your pharmacy, for a new meter and strips I have also sent a prescription to your pharmacy, to add Rybelsus. Please come back for a follow-up appointment in 1-2 weeks.  Please see Mickel Baas the same day, to learn about insulin.

## 2019-06-17 ENCOUNTER — Ambulatory Visit (INDEPENDENT_AMBULATORY_CARE_PROVIDER_SITE_OTHER): Payer: Medicare HMO | Admitting: Endocrinology

## 2019-06-17 ENCOUNTER — Encounter: Payer: Self-pay | Admitting: Endocrinology

## 2019-06-17 ENCOUNTER — Other Ambulatory Visit: Payer: Self-pay

## 2019-06-17 VITALS — BP 138/78 | HR 109 | Ht 65.0 in | Wt 172.0 lb

## 2019-06-17 DIAGNOSIS — E1151 Type 2 diabetes mellitus with diabetic peripheral angiopathy without gangrene: Secondary | ICD-10-CM

## 2019-06-17 DIAGNOSIS — E1165 Type 2 diabetes mellitus with hyperglycemia: Secondary | ICD-10-CM | POA: Diagnosis not present

## 2019-06-17 DIAGNOSIS — IMO0002 Reserved for concepts with insufficient information to code with codable children: Secondary | ICD-10-CM

## 2019-06-17 LAB — POCT GLYCOSYLATED HEMOGLOBIN (HGB A1C): Hemoglobin A1C: 8.8 % — AB (ref 4.0–5.6)

## 2019-06-17 MED ORDER — GLIPIZIDE 10 MG PO TABS: 10.0000 mg | ORAL_TABLET | Freq: Every day | ORAL | 3 refills | Status: DC

## 2019-06-17 MED ORDER — RYBELSUS 7 MG PO TABS: 7.0000 mg | ORAL_TABLET | ORAL | 11 refills | Status: DC

## 2019-06-17 NOTE — Patient Instructions (Addendum)
check your blood sugar once a day.  vary the time of day when you check, between before the 3 meals, and at bedtime.  also check if you have symptoms of your blood sugar being too high or too low.  please keep a record of the readings and bring it to your next appointment here (or you can bring the meter itself).  You can write it on any piece of paper.  please call us sooner if your blood sugar goes below 70, or if you have a lot of readings over 200. We will need to take this complex situation in stages. Please reduce the glipizide to 10 mg in the morning only, and: I have also sent a prescription to your pharmacy, to increase the Rybelsus. Please continue the same other medications.   Please come back for a follow-up appointment in 1 month.  Please see Mickel Baas the same day, to learn about insulin.

## 2019-06-17 NOTE — Progress Notes (Signed)
Subjective:    Patient ID: [name] [name], male    DOB: 1934/05/06, 85 y.o.   MRN: [id number]  HPI Pt returns for f/u of diabetes mellitus: DM type: 2 Dx'ed: Q000111Q Complications: CRI, PAD, and DR Therapy: 4 oral meds DKA: never Severe hypoglycemia: never Pancreatitis: never Pancreatic imaging: normal on 2013 CT SDOH: none Other: he has never been on insulin, and he declines; renal insuff and edema limit rx options Interval history: no cbg record, but states cbg's vary from 80-246.  He takes meds as rx'ed.  pt states he feels well in general.  Past Medical History:  Diagnosis Date  . Diabetes mellitus 1955   type 2 - fasting 100-120  . Diverticulosis 2009  . GERD (gastroesophageal reflux disease)   . Glaucoma    bilateral  . History of shingles   . Hyperlipidemia    5 yrs  . Hypertension   . 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 Osteoarthritis    "knees" (01/31/2014)  . Prostate cancer ([place])    "no treatments; it's going away by itself w/the pills I take"    Past Surgical History:  Procedure Laterality Date  . ANTERIOR CERVICAL DECOMP/DISCECTOMY FUSION N/A 09/03/2015   Procedure: ANTERIOR CERVICAL DECOMPRESSION FUSION, CERVICAL 5-6 WITH INSTRUMENTATION AND ALLOGRAFT;  Surgeon: Phylliss Bob, MD;  Location: [facility];  Service: Orthopedics;  Laterality: N/A;  ANTERIOR CERVICAL DECOMPRESSION FUSION, CERVICAL 5-6 WITH INSTRUMENTATION AND ALLOGRAFT  . COLONOSCOPY    . FINE NEEDLE ASPIRATION Left 06/10/15   Knee  . FINE NEEDLE ASPIRATION  05/26/15   Back  . JOINT REPLACEMENT    . TONSILLECTOMY    . TOTAL KNEE ARTHROPLASTY Right 01/31/2014  . TOTAL KNEE ARTHROPLASTY Right 01/31/2014   Procedure: RIGHT TOTAL KNEE ARTHROPLASTY;  Surgeon: Kerin Salen, MD;  Location: [facility];  Service: Orthopedics;  Laterality: Right;    Social History   Socioeconomic History  . Marital status: Widowed    Spouse name: Not on file  . Number of children: Not on  file  . Years of education: Not on file  . 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 Smoking status: Former Smoker    Years: 2.00    Types: Cigars    Quit date: 04/12/1995    Years since quitting: 24.2  . Smokeless tobacco: Never Used  . Tobacco comment: chews cigars still   Substance and Sexual Activity  . Alcohol use: No  . Drug use: No  . Sexual activity: Never  Other Topics Concern  . Not on file  Social History Narrative   Works out in the garden   Social Determinants of Radio broadcast assistant Strain:   . Difficulty of Paying Living Expenses: Not on file  Food Insecurity:   . Worried About Charity fundraiser in the Last Year: Not on file  . Ran Out of Food in the Last Year: Not on file  Transportation Needs:   . Lack of Transportation (Medical): Not on file  . Lack of Transportation (Non-Medical): Not on file  Physical Activity:   . Days of Exercise per Week: Not on file  . Minutes of Exercise per Session: Not on file  Stress:   . Feeling of Stress : Not on file  Social Connections:   . Frequency of Communication with Friends  and Family: Not on file  . Frequency of Social Gatherings with Friends and Family: Not on file  . Attends Religious Services: Not on file  . Active Member of Clubs or Organizations: Not on file  . Attends Archivist Meetings: Not on file  . Marital Status: Not on file  Intimate Partner Violence:   . Fear of Current or Ex-Partner: Not on file  . Emotionally Abused: Not on file  . Physically Abused: Not on file  . Sexually Abused: Not on file    Current Outpatient Medications on File Prior to Visit  Medication Sig Dispense Refill  . acetaminophen (TYLENOL) 500 MG tablet Take 500 mg by mouth every 6 (six) hours as needed.    Marland Kitchen aspirin 81 MG chewable tablet Chew by mouth daily.    Marland Kitchen atorvastatin (LIPITOR) 20 MG tablet Take 1 tablet (20 mg  total) by mouth daily. 90 tablet 3  . Blood Glucose Monitoring Suppl (ACCU-CHEK AVIVA) device Use as instructed 1 each 0  . Coenzyme Q10 (CO Q 10 PO) Take 50 mg by mouth daily.    . finasteride (PROSCAR) 5 MG tablet Take 5 mg by mouth daily.    . furosemide (LASIX) 20 MG tablet Take 1 tablet (20 mg total) by mouth every other day. 90 tablet 3  . gabapentin (NEURONTIN) 600 MG tablet Take 1 tablet (600 mg total) by mouth 3 (three) times daily. 270 tablet 3  . glucose blood (ACCU-CHEK AVIVA PLUS) test strip 1 each by Other route daily. And lancets 1/day 100 each 12  . latanoprost (XALATAN) 0.005 % ophthalmic solution Place 1 drop into both eyes at bedtime.    . metFORMIN (GLUCOPHAGE) 1000 MG tablet TAKE 1 TABLET BY MOUTH 2 TIMES DAILY WITH A MEAL 180 tablet 3  . nateglinide (STARLIX) 60 MG tablet Take 1 tablet (60 mg total) by mouth 3 (three) times daily with meals. 270 tablet 3  . Omega-3 Fatty Acids (FISH OIL) 1000 MG CAPS Take 1 capsule by mouth 2 (two) times daily.    Marland Kitchen omeprazole (PRILOSEC) 20 MG capsule TAKE 1 CAPSULE BY MOUTH EVERY DAY (Patient taking differently: 2 (two) times daily before a meal. TAKE 1 CAPSULE BY MOUTH EVERY DAY) 90 capsule 3  . ramipril (ALTACE) 2.5 MG capsule TAKE 1 CAPSULE BY MOUTH EVERY DAY 90 capsule 3  . traMADol (ULTRAM) 50 MG tablet Take 1 tablet by mouth daily.  0   No current facility-administered medications on file prior to visit.    No Known Allergies  Family History  Problem Relation Age of Onset  . Arthritis Other   . Diabetes Other   . Cancer Other        prostate  . Heart disease Brother        questionable  . Colon cancer Daughter 109  . Diabetes Mother   . Colon polyps Neg Hx   . Esophageal cancer Neg Hx   . Rectal cancer Neg Hx   . Stomach cancer Neg Hx     BP 138/78 (BP Location: Left Arm, Patient Position: Sitting, Cuff Size: Normal)   Pulse (!) 109   Ht 5\' 5"  (1.651 m)   Wt 172 lb (78 kg)   SpO2 93%   BMI 28.62 kg/m    Review  of Systems He denies hypoglycemia.    Objective:   Physical Exam VITAL SIGNS:  See vs page GENERAL: no distress Pulses: dorsalis pedis intact bilat.   MSK: no deformity of  the feet CV: trace bilat leg edema Skin:  no ulcer on the feet.  normal color and temp on the feet. Neuro: sensation is intact to touch on the feet  Lab Results  Component Value Date   HGBA1C 8.8 (A) 06/17/2019   Lab Results  Component Value Date   CREATININE 1.12 04/25/2019   BUN 15 04/25/2019   NA 138 04/25/2019   K 4.0 04/25/2019   CL 100 04/25/2019   CO2 29 04/25/2019        Assessment & Plan:  Type 2 DM, with PAD: he needs increased rx Edema: This limits rx options.  Renal insuff: we'll reduce metformin when glycemic control allows.   Patient Instructions  check your blood sugar once a day.  vary the time of day when you check, between before the 3 meals, and at bedtime.  also check if you have symptoms of your blood sugar being too high or too low.  please keep a record of the readings and bring it to your next appointment here (or you can bring the meter itself).  You can write it on any piece of paper.  please call us sooner if your blood sugar goes below 70, or if you have a lot of readings over 200. We will need to take this complex situation in stages. Please reduce the glipizide to 10 mg in the morning only, and: I have also sent a prescription to your pharmacy, to increase the Rybelsus. Please continue the same other medications.   Please come back for a follow-up appointment in 1 month.  Please see Mickel Baas the same day, to learn about insulin.

## 2019-06-27 ENCOUNTER — Encounter: Payer: Self-pay | Admitting: Dietician

## 2019-06-27 ENCOUNTER — Encounter: Payer: Medicare HMO | Attending: Endocrinology | Admitting: Dietician

## 2019-06-27 ENCOUNTER — Other Ambulatory Visit: Payer: Self-pay

## 2019-06-27 DIAGNOSIS — E1165 Type 2 diabetes mellitus with hyperglycemia: Secondary | ICD-10-CM | POA: Diagnosis not present

## 2019-06-27 DIAGNOSIS — E1139 Type 2 diabetes mellitus with other diabetic ophthalmic complication: Secondary | ICD-10-CM | POA: Insufficient documentation

## 2019-06-27 DIAGNOSIS — IMO0002 Reserved for concepts with insufficient information to code with codable children: Secondary | ICD-10-CM

## 2019-06-27 NOTE — Patient Instructions (Signed)
Bake, grill, or boil most often rather than fry. Rethink what you are drinking.  Drink more water rather than juice.  Aim for 4 Carb Choices per meal (60 grams) +/- 1 either way  Aim for 0-2 Carbs per snack if hungry  Include protein in moderation with your meals and snacks Consider reading food labels for Total Carbohydrate of foods Consider  increasing your activity level by gardening or walking for at least 30 minutes daily as tolerated Continue checking BG at alternate times per day  Continuetaking medication as directed by MD

## 2019-06-27 NOTE — Progress Notes (Signed)
Diabetes Self-Management Education  Visit Type: First/Initial  Appt. Start Time: 1045 Appt. End Time: 1150  06/27/2019  [name], identified by name and date of birth, is a 85 y.o. male with a diagnosis of Diabetes: Type 2.   ASSESSMENT Patient is here today alone.  Patient of Dr. Cordelia Pen with a history of type 2 diabetes since 1985.  He is to learn how to give insulin during this visit.  Patient states that he took insulin when first diagnosed years ago and hopes that he does not need to go on insulin again. He reports that his glucose has been better controlled since starting on Rybelsus. Medications include glipizide, metformin, starlix, rybelsus Instructed patient on insulin (using a vial and syringe as well as a pen).  He was able to demonstrate.  He states that he is having a problem sticking his fingers and getting the blood on the strip. He states that eating cookies and drinking coffee has made his blood sugar high. He did bring his meter ([employer]um[employer]) into the office.  He states that he likes this but is out of strips and will be picking some up at the pharmacy.   He is not interested in a different meter at this time.  Patient lives with his nephew.  He does most of the shopping and cooking and they eat out often.  He states that he likes a lot of cold cuts and vegetables. He enjoys gardening (25'x25') and grows greens, tomatoes, squash, cucumbers, okra. He works at [employer].  He is retired from [employer] in [place] and worked in [employer] prior to that.   Weight 169 lb (76.7 kg). Body mass index is 28.12 kg/m.  Diabetes Self-Management Education - 06/27/19 1113      Visit Information   Visit Type  First/Initial      Initial Visit   Diabetes Type  Type 2    Are you currently following a meal plan?  No    Are you taking your medications as prescribed?  Yes    Date Diagnosed  1985      Health Coping   How would you rate your overall health?   Good      Psychosocial Assessment   Patient Belief/Attitude about Diabetes  Motivated to manage diabetes    Self-care barriers  None    Self-management support  Doctor's office    Other persons present  Patient    Patient Concerns  Nutrition/Meal planning;Medication;Glycemic Control    Special Needs  None    Preferred Learning Style  No preference indicated    Learning Readiness  Ready    How often do you need to have someone help you when you read instructions, pamphlets, or other written materials from your doctor or pharmacy?  1 - Never    What is the last grade level you completed in school?  12th grade      Pre-Education Assessment   Patient understands the diabetes disease and treatment process.  Needs Review    Patient understands incorporating nutritional management into lifestyle.  Needs Review    Patient undertands incorporating physical activity into lifestyle.  Needs Review    Patient understands using medications safely.  Needs Review    Patient understands monitoring blood glucose, interpreting and using results  Needs Review    Patient understands prevention, detection, and treatment of acute complications.  Needs Review    Patient understands prevention, detection, and treatment of chronic complications.  Needs Review  Patient understands how to develop strategies to address psychosocial issues.  Needs Review    Patient understands how to develop strategies to promote health/change behavior.  Needs Review      Complications   Last HgB A1C per patient/outside source  8.8 %   06/17/19 decreased from 10.3 04/25/19   How often do you check your blood sugar?  1-2 times/day    Fasting Blood glucose range (mg/dL)  70-129    Postprandial Blood glucose range (mg/dL)  130-179    Number of hypoglycemic episodes per month  0    Number of hyperglycemic episodes per week  0    Have you had a dilated eye exam in the past 12 months?  Yes    Have you had a dental exam in the past 12  months?  No   dentures   Are you checking your feet?  Yes    How many days per week are you checking your feet?  7      Dietary Intake   Breakfast  cereal with milk after his medications   0930   Snack (morning)  grits, eggs, sausage or bacon, toast, home fries    Lunch  tuna with peppers, onions, celery, mayo on [employer] bread, fried french fries   2-3   Snack (afternoon)  occasional sandwich or salad    Dinner  chicken or steak or pork chips (grilled), rice, cabbage or canned green beans or salad   7-8   Snack (evening)  cabbage, carrots, cornbread (leftovers)    Beverage(s)  water, OJ (24 oz per day), diet soda, coffee with 1 1/2 sugar substitute, unsweetened tea with sugar sub      Exercise   Exercise Type  Light (walking / raking leaves)   gardening   How many days per week to you exercise?  7    How many minutes per day do you exercise?  120    Total minutes per week of exercise  840      Patient Education   Previous Diabetes Education  Yes (please comment)   2009   Nutrition management   Role of diet in the treatment of diabetes and the relationship between the three main macronutrients and blood glucose level;Meal options for control of blood glucose level and chronic complications.;Meal timing in regards to the patients' current diabetes medication.    Physical activity and exercise   Role of exercise on diabetes management, blood pressure control and cardiac health.    Medications  Reviewed patients medication for diabetes, action, purpose, timing of dose and side effects.;Taught/reviewed insulin injection, site rotation, insulin storage and needle disposal.    Monitoring  Taught/discussed recording of test results and interpretation of SMBG.;Identified appropriate SMBG and/or A1C goals.    Acute complications  Taught treatment of hypoglycemia - the 15 rule.    Chronic complications  Relationship between chronic complications and blood glucose control    Psychosocial adjustment   Worked with patient to identify barriers to care and solutions      Individualized Goals (developed by patient)   Nutrition  General guidelines for healthy choices and portions discussed    Physical Activity  Exercise 5-7 days per week;45 minutes per day    Medications  take my medication as prescribed    Monitoring   test my blood glucose as discussed    Reducing Risk  examine blood glucose patterns;do foot checks daily;increase portions of healthy fats    Health Coping  discuss  diabetes with (comment)      Post-Education Assessment   Patient understands the diabetes disease and treatment process.  Demonstrates understanding / competency    Patient understands incorporating nutritional management into lifestyle.  Demonstrates understanding / competency    Patient undertands incorporating physical activity into lifestyle.  Demonstrates understanding / competency    Patient understands using medications safely.  Demonstrates understanding / competency    Patient understands monitoring blood glucose, interpreting and using results  Demonstrates understanding / competency    Patient understands prevention, detection, and treatment of acute complications.  Demonstrates understanding / competency    Patient understands prevention, detection, and treatment of chronic complications.  Demonstrates understanding / competency    Patient understands how to develop strategies to address psychosocial issues.  Demonstrates understanding / competency    Patient understands how to develop strategies to promote health/change behavior.  Demonstrates understanding / competency      Outcomes   Expected Outcomes  Demonstrated interest in learning. Expect positive outcomes    Future DMSE  PRN    Program Status  Completed       Individualized Plan for Diabetes Self-Management Training:   Learning Objective:  Patient will have a greater understanding of diabetes self-management. Patient education plan is to  attend individual and/or group sessions per assessed needs and concerns.   Plan:   Patient Instructions  Bake, grill, or boil most often rather than fry. Rethink what you are drinking.  Drink more water rather than juice.  Aim for 4 Carb Choices per meal (60 grams) +/- 1 either way  Aim for 0-2 Carbs per snack if hungry  Include protein in moderation with your meals and snacks Consider reading food labels for Total Carbohydrate of foods Consider  increasing your activity level by gardening or walking for at least 30 minutes daily as tolerated Continue checking BG at alternate times per day  Continuetaking medication as directed by MD      Expected Outcomes:  Demonstrated interest in learning. Expect positive outcomes  Education material provided: ADA - How to Thrive: A Guide for Your Journey with Diabetes, meal plan card  If problems or questions, patient to contact team via:  Phone  Future DSME appointment: PRN

## 2019-07-05 ENCOUNTER — Other Ambulatory Visit: Payer: Self-pay | Admitting: Family Medicine

## 2019-07-07 ENCOUNTER — Other Ambulatory Visit: Payer: Self-pay | Admitting: Family Medicine

## 2019-07-18 ENCOUNTER — Other Ambulatory Visit: Payer: Self-pay

## 2019-07-18 ENCOUNTER — Encounter: Payer: Self-pay | Admitting: Endocrinology

## 2019-07-18 ENCOUNTER — Ambulatory Visit (INDEPENDENT_AMBULATORY_CARE_PROVIDER_SITE_OTHER): Payer: Medicare HMO | Admitting: Endocrinology

## 2019-07-18 VITALS — BP 120/70 | HR 84 | Ht 65.0 in | Wt 166.2 lb

## 2019-07-18 DIAGNOSIS — IMO0002 Reserved for concepts with insufficient information to code with codable children: Secondary | ICD-10-CM

## 2019-07-18 DIAGNOSIS — E1165 Type 2 diabetes mellitus with hyperglycemia: Secondary | ICD-10-CM

## 2019-07-18 DIAGNOSIS — E1139 Type 2 diabetes mellitus with other diabetic ophthalmic complication: Secondary | ICD-10-CM

## 2019-07-18 MED ORDER — RYBELSUS 14 MG PO TABS: 14.0000 mg | ORAL_TABLET | Freq: Every day | ORAL | 3 refills | Status: DC

## 2019-07-18 MED ORDER — GLIPIZIDE ER 2.5 MG PO TB24: 2.5000 mg | ORAL_TABLET | Freq: Every day | ORAL | 5 refills | Status: DC

## 2019-07-18 NOTE — Progress Notes (Signed)
Subjective:    Patient ID: [name] [name], male    DOB: [birth year], 85 y.o.   MRN: [id number]  HPI Pt returns for f/u of diabetes mellitus: DM type: 2 Dx'ed: Q000111Q Complications: CRI, PAD, and DR Therapy: 4 oral meds DKA: never Severe hypoglycemia: never Pancreatitis: never Pancreatic imaging: normal on 2013 CT SDOH: none Other: he has never been on insulin, and he declines; renal insuff and edema limit rx options.  Interval history: no cbg record, but states cbg's vary from 90-180.  He takes meds as rx'ed.  pt states he feels well in general.  Past Medical History:  Diagnosis Date  . Diabetes mellitus 1955   type 2 - fasting 100-120  . Diverticulosis 2009  . GERD (gastroesophageal reflux disease)   . Glaucoma    bilateral  . History of shingles   . Hyperlipidemia    5 yrs  . Hypertension   . Neuromuscular disorder ([place])    due to shingles around abdomen area   . Osteoarthritis    "knees" (01/31/2014)  . Prostate cancer ([place])    "no treatments; it's going away by itself w/the pills I take"    Past Surgical History:  Procedure Laterality Date  . ANTERIOR CERVICAL DECOMP/DISCECTOMY FUSION N/A 09/03/2015   Procedure: ANTERIOR CERVICAL DECOMPRESSION FUSION, CERVICAL 5-6 WITH INSTRUMENTATION AND ALLOGRAFT;  Surgeon: Phylliss Bob, MD;  Location: [facility];  Service: Orthopedics;  Laterality: N/A;  ANTERIOR CERVICAL DECOMPRESSION FUSION, CERVICAL 5-6 WITH INSTRUMENTATION AND ALLOGRAFT  . COLONOSCOPY    . FINE NEEDLE ASPIRATION Left 06/10/15   Knee  . FINE NEEDLE ASPIRATION  05/26/15   Back  . JOINT REPLACEMENT    . TONSILLECTOMY    . TOTAL KNEE ARTHROPLASTY Right 01/31/2014  . TOTAL KNEE ARTHROPLASTY Right 01/31/2014   Procedure: RIGHT TOTAL KNEE ARTHROPLASTY;  Surgeon: Kerin Salen, MD;  Location: [facility];  Service: Orthopedics;  Laterality: Right;    Social History   Socioeconomic History  . Marital status: Widowed    Spouse name: Not on file  . Number of children: Not on  file  . Years of education: Not on file  . 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 Smoking status: Former Smoker    Years: 2.00    Types: Cigars    Quit date: 04/12/1995    Years since quitting: 24.2  . Smokeless tobacco: Never Used  . Tobacco comment: chews cigars still   Substance and Sexual Activity  . Alcohol use: No  . Drug use: No  . Sexual activity: Never  Other Topics Concern  . Not on file  Social History Narrative   Works out in the garden   Social Determinants of Radio broadcast assistant Strain:   . Difficulty of Paying Living Expenses:   Food Insecurity:   . Worried About Charity fundraiser in the Last Year:   . Arboriculturist in the Last Year:   Transportation Needs:   . Film/video editor (Medical):   Marland Kitchen Lack of Transportation (Non-Medical):   Physical Activity:   . Days of Exercise per Week:   . Minutes of Exercise per Session:   Stress:   . Feeling of Stress :   Social Connections:   . Frequency of Communication with Friends and Family:   . Frequency of Social Gatherings with Friends and Family:   .  Attends Religious Services:   . Active Member of Clubs or Organizations:   . Attends Archivist Meetings:   Marland Kitchen Marital Status:   Intimate Partner Violence:   . Fear of Current or Ex-Partner:   . Emotionally Abused:   Marland Kitchen Physically Abused:   . Sexually Abused:     Current Outpatient Medications on File Prior to Visit  Medication Sig Dispense Refill  . acetaminophen (TYLENOL) 500 MG tablet Take 500 mg by mouth every 6 (six) hours as needed.    . Ascorbic Acid (VITAMIN C) 1000 MG tablet Take 1,000 mg by mouth daily.    Marland Kitchen aspirin 81 MG chewable tablet Chew by mouth daily.    Marland Kitchen atorvastatin (LIPITOR) 20 MG tablet TAKE 1 TABLET BY MOUTH EVERY DAY 90 tablet 1  . b complex vitamins tablet Take 1 tablet by mouth daily.    . Blood Glucose Monitoring Suppl  (ACCU-CHEK AVIVA) device Use as instructed 1 each 0  . cholecalciferol (VITAMIN D3) 25 MCG (1000 UNIT) tablet Take 1,000 Units by mouth daily.    . Coenzyme Q10 (CO Q 10 PO) Take 50 mg by mouth daily.    . finasteride (PROSCAR) 5 MG tablet Take 5 mg by mouth daily.    . furosemide (LASIX) 20 MG tablet Take 1 tablet (20 mg total) by mouth every other day. 90 tablet 3  . gabapentin (NEURONTIN) 600 MG tablet Take 1 tablet (600 mg total) by mouth 3 (three) times daily. 270 tablet 3  . glucose blood (ACCU-CHEK AVIVA PLUS) test strip 1 each by Other route daily. And lancets 1/day 100 each 12  . latanoprost (XALATAN) 0.005 % ophthalmic solution Place 1 drop into both eyes at bedtime.    . metFORMIN (GLUCOPHAGE) 1000 MG tablet TAKE 1 TABLET BY MOUTH 2 TIMES DAILY WITH A MEAL 180 tablet 3  . nateglinide (STARLIX) 60 MG tablet Take 1 tablet (60 mg total) by mouth 3 (three) times daily with meals. 270 tablet 3  . Omega-3 Fatty Acids (FISH OIL) 1000 MG CAPS Take 1 capsule by mouth 2 (two) times daily.    Marland Kitchen omeprazole (PRILOSEC) 20 MG capsule TAKE 1 CAPSULE BY MOUTH EVERY DAY 90 capsule 1  . ramipril (ALTACE) 2.5 MG capsule TAKE 1 CAPSULE BY MOUTH EVERY DAY 90 capsule 3  . traMADol (ULTRAM) 50 MG tablet Take 1 tablet by mouth daily.  0   No current facility-administered medications on file prior to visit.    No Known Allergies  Family History  Problem Relation Age of Onset  . Arthritis Other   . Diabetes Other   . Cancer Other        prostate  . Heart disease Brother        questionable  . Colon cancer Daughter 6  . Diabetes Mother   . Colon polyps Neg Hx   . Esophageal cancer Neg Hx   . Rectal cancer Neg Hx   . Stomach cancer Neg Hx     BP 120/70   Pulse 84   Ht 5\' 5"  (1.651 m)   Wt 166 lb 3.2 oz (75.4 kg)   SpO2 99%   BMI 27.66 kg/m    Review of Systems He denies hypoglycemia.     Objective:   Physical Exam VITAL SIGNS:  See vs page GENERAL: no distress Pulses: dorsalis  pedis intact bilat.   MSK: no deformity of the feet CV: no leg edema Skin:  no ulcer on the feet.  normal color and temp on the feet. Neuro: sensation is intact to touch on the feet Ext: there is bilateral onychomycosis of the toenails.    Lab Results  Component Value Date   CREATININE 1.12 04/25/2019   BUN 15 04/25/2019   NA 138 04/25/2019   K 4.0 04/25/2019   CL 100 04/25/2019   CO2 29 04/25/2019       Assessment & Plan:  Type 2 DM: apparently well-controlled Renal insuff: glipizide is not favored. Overweight: increasing Rybelsus might help.   Patient Instructions  check your blood sugar once a day.  vary the time of day when you check, between before the 3 meals, and at bedtime.  also check if you have symptoms of your blood sugar being too high or too low.  please keep a record of the readings and bring it to your next appointment here (or you can bring the meter itself).  You can write it on any piece of paper.  please call us sooner if your blood sugar goes below 70, or if you have a lot of readings over 200. We will need to take this complex situation in stages. Please reduce the glipizide to 2.5 mg in the morning, and: I have also sent a prescription to your pharmacy, to increase the Rybelsus. Please continue the same other medications.   Please come back for a follow-up appointment in 1 month.

## 2019-07-18 NOTE — Patient Instructions (Addendum)
check your blood sugar once a day.  vary the time of day when you check, between before the 3 meals, and at bedtime.  also check if you have symptoms of your blood sugar being too high or too low.  please keep a record of the readings and bring it to your next appointment here (or you can bring the meter itself).  You can write it on any piece of paper.  please call us sooner if your blood sugar goes below 70, or if you have a lot of readings over 200. We will need to take this complex situation in stages. Please reduce the glipizide to 2.5 mg in the morning, and: I have also sent a prescription to your pharmacy, to increase the Rybelsus. Please continue the same other medications.   Please come back for a follow-up appointment in 1 month.

## 2019-08-20 ENCOUNTER — Encounter: Payer: Self-pay | Admitting: Endocrinology

## 2019-08-20 ENCOUNTER — Ambulatory Visit (INDEPENDENT_AMBULATORY_CARE_PROVIDER_SITE_OTHER): Payer: Medicare HMO | Admitting: Endocrinology

## 2019-08-20 ENCOUNTER — Other Ambulatory Visit: Payer: Self-pay

## 2019-08-20 VITALS — BP 114/70 | HR 86 | Ht 65.0 in | Wt 159.0 lb

## 2019-08-20 DIAGNOSIS — N401 Enlarged prostate with lower urinary tract symptoms: Secondary | ICD-10-CM | POA: Diagnosis not present

## 2019-08-20 DIAGNOSIS — IMO0002 Reserved for concepts with insufficient information to code with codable children: Secondary | ICD-10-CM

## 2019-08-20 DIAGNOSIS — E1139 Type 2 diabetes mellitus with other diabetic ophthalmic complication: Secondary | ICD-10-CM | POA: Diagnosis not present

## 2019-08-20 DIAGNOSIS — E1165 Type 2 diabetes mellitus with hyperglycemia: Secondary | ICD-10-CM

## 2019-08-20 LAB — POCT GLYCOSYLATED HEMOGLOBIN (HGB A1C): Hemoglobin A1C: 6.2 % — AB (ref 4.0–5.6)

## 2019-08-20 MED ORDER — REPAGLINIDE 2 MG PO TABS: 2.0000 mg | ORAL_TABLET | Freq: Three times a day (TID) | ORAL | 3 refills | Status: DC

## 2019-08-20 NOTE — Patient Instructions (Addendum)
check your blood sugar once a day.  vary the time of day when you check, between before the 3 meals, and at bedtime.  also check if you have symptoms of your blood sugar being too high or too low.  please keep a record of the readings and bring it to your next appointment here (or you can bring the meter itself).  You can write it on any piece of paper.  please call us sooner if your blood sugar goes below 70, or if you have a lot of readings over 200. We will need to take this complex situation in stages. Please stop takine the glipizide, and: I have also sent a prescription to your pharmacy, to change glipizide to repaglinide. Please continue the same other medications.   Please come back for a follow-up appointment in 2 months.

## 2019-08-20 NOTE — Progress Notes (Signed)
Subjective:    Patient ID: [name] [name], male    DOB: Aug 28, 1934, 85 y.o.   MRN: [id number]  HPI Pt returns for f/u of diabetes mellitus: DM type: 2 Dx'ed: [id number] Complications: CRI, PAD, and DR Therapy: 4 oral meds DKA: never Severe hypoglycemia: never Pancreatitis: never Pancreatic imaging: normal on 2013 CT SDOH: none Other: he has never been on insulin, and he declines; renal insuff and edema limit rx options.   Interval history: no cbg record, but states cbg's vary from 78-100's.  He takes meds as rx'ed.  pt states he feels well in general.   Past Medical History:  Diagnosis Date  . Diabetes mellitus 1955   type 2 - fasting 100-120  . Diverticulosis 2009  . GERD (gastroesophageal reflux disease)   . Glaucoma    bilateral  . History of shingles   . Hyperlipidemia    5 yrs  . Hypertension   . 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 Osteoarthritis    "knees" (01/31/2014)  . Prostate cancer ([place])    "no treatments; it's going away by itself w/the pills I take"    Past Surgical History:  Procedure Laterality Date  . ANTERIOR CERVICAL DECOMP/DISCECTOMY FUSION N/A 09/03/2015   Procedure: ANTERIOR CERVICAL DECOMPRESSION FUSION, CERVICAL 5-6 WITH INSTRUMENTATION AND ALLOGRAFT;  Surgeon: Phylliss Bob, MD;  Location: [facility];  Service: Orthopedics;  Laterality: N/A;  ANTERIOR CERVICAL DECOMPRESSION FUSION, CERVICAL 5-6 WITH INSTRUMENTATION AND ALLOGRAFT  . COLONOSCOPY    . FINE NEEDLE ASPIRATION Left 06/10/15   Knee  . FINE NEEDLE ASPIRATION  05/26/15   Back  . JOINT REPLACEMENT    . TONSILLECTOMY    . TOTAL KNEE ARTHROPLASTY Right 01/31/2014  . TOTAL KNEE ARTHROPLASTY Right 01/31/2014   Procedure: RIGHT TOTAL KNEE ARTHROPLASTY;  Surgeon: Kerin Salen, MD;  Location: [facility];  Service: Orthopedics;  Laterality: Right;    Social History   Socioeconomic History  . Marital status: Widowed    Spouse name: Not on file  . Number of children: Not  on file  . Years of education: Not on file  . 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 Smoking status: Former Smoker    Years: 2.00    Types: Cigars    Quit date: 04/12/1995    Years since quitting: 24.3  . Smokeless tobacco: Never Used  . Tobacco comment: chews cigars still   Substance and Sexual Activity  . Alcohol use: No  . Drug use: No  . Sexual activity: Never  Other Topics Concern  . Not on file  Social History Narrative   Works out in the garden   Social Determinants of Radio broadcast assistant Strain:   . Difficulty of Paying Living Expenses:   Food Insecurity:   . Worried About Charity fundraiser in the Last Year:   . Arboriculturist in the Last Year:   Transportation Needs:   . Film/video editor (Medical):   Marland Kitchen Lack of Transportation (Non-Medical):   Physical Activity:   . Days of Exercise per Week:   . Minutes of Exercise per Session:   Stress:   . Feeling of Stress :   Social Connections:   . Frequency of Communication with Friends and Family:   . Frequency of Social Gatherings with Friends and Family:   .  Attends Religious Services:   . Active Member of Clubs or Organizations:   . Attends Archivist Meetings:   Marland Kitchen Marital Status:   Intimate Partner Violence:   . Fear of Current or Ex-Partner:   . Emotionally Abused:   Marland Kitchen Physically Abused:   . Sexually Abused:     Current Outpatient Medications on File Prior to Visit  Medication Sig Dispense Refill  . acetaminophen (TYLENOL) 500 MG tablet Take 500 mg by mouth every 6 (six) hours as needed.    . Ascorbic Acid (VITAMIN C) 1000 MG tablet Take 1,000 mg by mouth daily.    Marland Kitchen aspirin 81 MG chewable tablet Chew by mouth daily.    Marland Kitchen atorvastatin (LIPITOR) 20 MG tablet TAKE 1 TABLET BY MOUTH EVERY DAY 90 tablet 1  . b complex vitamins tablet Take 1 tablet by mouth daily.    . Blood Glucose Monitoring  Suppl (ACCU-CHEK AVIVA) device Use as instructed 1 each 0  . cholecalciferol (VITAMIN D3) 25 MCG (1000 UNIT) tablet Take 1,000 Units by mouth daily.    . Coenzyme Q10 (CO Q 10 PO) Take 50 mg by mouth daily.    . finasteride (PROSCAR) 5 MG tablet Take 5 mg by mouth daily.    . furosemide (LASIX) 20 MG tablet Take 1 tablet (20 mg total) by mouth every other day. 90 tablet 3  . gabapentin (NEURONTIN) 600 MG tablet Take 1 tablet (600 mg total) by mouth 3 (three) times daily. 270 tablet 3  . glucose blood (ACCU-CHEK AVIVA PLUS) test strip 1 each by Other route daily. And lancets 1/day 100 each 12  . latanoprost (XALATAN) 0.005 % ophthalmic solution Place 1 drop into both eyes at bedtime.    . metFORMIN (GLUCOPHAGE) 1000 MG tablet TAKE 1 TABLET BY MOUTH 2 TIMES DAILY WITH A MEAL 180 tablet 3  . Omega-3 Fatty Acids (FISH OIL) 1000 MG CAPS Take 1 capsule by mouth 2 (two) times daily.    Marland Kitchen omeprazole (PRILOSEC) 20 MG capsule TAKE 1 CAPSULE BY MOUTH EVERY DAY 90 capsule 1  . ramipril (ALTACE) 2.5 MG capsule TAKE 1 CAPSULE BY MOUTH EVERY DAY 90 capsule 3  . Semaglutide (RYBELSUS) 14 MG TABS Take 14 mg by mouth daily. 90 tablet 3  . traMADol (ULTRAM) 50 MG tablet Take 1 tablet by mouth daily.  0   No current facility-administered medications on file prior to visit.    No Known Allergies  Family History  Problem Relation Age of Onset  . Arthritis Other   . Diabetes Other   . Cancer Other        prostate  . Heart disease Brother        questionable  . Colon cancer Daughter 40  . Diabetes Mother   . Colon polyps Neg Hx   . Esophageal cancer Neg Hx   . Rectal cancer Neg Hx   . Stomach cancer Neg Hx     BP 114/70   Pulse 86   Ht 5\' 5"  (1.651 m)   Wt 159 lb (72.1 kg)   SpO2 96%   BMI 26.46 kg/m    Review of Systems He denies hypoglycemia.      Objective:   Physical Exam VITAL SIGNS:  See vs page GENERAL: no distress Pulses: dorsalis pedis intact bilat.   MSK: no deformity of the  feet CV: no leg edema Skin:  no ulcer on the feet.  normal color and temp on the feet. Neuro: sensation  is intact to touch on the feet.   Lab Results  Component Value Date   CREATININE 1.12 04/25/2019   BUN 15 04/25/2019   NA 138 04/25/2019   K 4.0 04/25/2019   CL 100 04/25/2019   CO2 29 04/25/2019   Lab Results  Component Value Date   HGBA1C 6.2 (A) 08/20/2019       Assessment & Plan:  Type 2 DM: overcontrolled, for this SU-containing regimen.   CRI: This limits rx options.   Patient Instructions  check your blood sugar once a day.  vary the time of day when you check, between before the 3 meals, and at bedtime.  also check if you have symptoms of your blood sugar being too high or too low.  please keep a record of the readings and bring it to your next appointment here (or you can bring the meter itself).  You can write it on any piece of paper.  please call us sooner if your blood sugar goes below 70, or if you have a lot of readings over 200. We will need to take this complex situation in stages. Please stop takine the glipizide, and: I have also sent a prescription to your pharmacy, to change glipizide to repaglinide. Please continue the same other medications.   Please come back for a follow-up appointment in 2 months.

## 2019-08-27 DIAGNOSIS — R351 Nocturia: Secondary | ICD-10-CM | POA: Diagnosis not present

## 2019-08-27 DIAGNOSIS — N401 Enlarged prostate with lower urinary tract symptoms: Secondary | ICD-10-CM | POA: Diagnosis not present

## 2019-08-27 DIAGNOSIS — C61 Malignant neoplasm of prostate: Secondary | ICD-10-CM | POA: Diagnosis not present

## 2019-09-02 ENCOUNTER — Other Ambulatory Visit: Payer: Self-pay

## 2019-09-02 ENCOUNTER — Encounter: Payer: Self-pay | Admitting: *Deleted

## 2019-09-02 ENCOUNTER — Ambulatory Visit (INDEPENDENT_AMBULATORY_CARE_PROVIDER_SITE_OTHER): Payer: Medicare HMO | Admitting: *Deleted

## 2019-09-02 VITALS — BP 100/64 | HR 60 | Temp 97.0°F | Ht 65.0 in | Wt 153.0 lb

## 2019-09-02 DIAGNOSIS — Z0000 Encounter for general adult medical examination without abnormal findings: Secondary | ICD-10-CM

## 2019-09-02 NOTE — Patient Instructions (Signed)
Please schedule your next medicare wellness visit with me in 1 yr.  Continue to eat heart healthy diet (full of fruits, vegetables, whole grains, lean protein, water--limit salt, fat, and sugar intake) and increase physical activity as tolerated.  Continue doing brain stimulating activities (puzzles, reading, adult coloring books, staying active) to keep memory sharp.   Bring a copy of your living will and/or healthcare power of attorney to your next office visit.   [name] [name] , Thank you for taking time to come for your Medicare Wellness Visit. I appreciate your ongoing commitment to your health goals. Please review the following plan we discussed and let me know if I can assist you in the future.   These are the goals we discussed: Goals    . Maintain current health       This is a list of the screening recommended for you and due dates:  Health Maintenance  Topic Date Due  . COVID-19 Vaccine (1) Never done  . Tetanus Vaccine  10/15/2017  . Eye exam for diabetics  05/02/2018  . Flu Shot  11/10/2019  . Hemoglobin A1C  02/20/2020  . Complete foot exam   04/24/2020  . Pneumonia vaccines  Completed    Preventive Care 103 Years and Older, Male Preventive care refers to lifestyle choices and visits with your health care provider that can promote health and wellness. This includes:  A yearly physical exam. This is also called an annual well check.  Regular dental and eye exams.  Immunizations.  Screening for certain conditions.  Healthy lifestyle choices, such as diet and exercise. What can I expect for my preventive care visit? Physical exam Your health care provider will check:  Height and weight. These may be used to calculate body mass index (BMI), which is a measurement that tells if you are at a healthy weight.  Heart rate and blood pressure.  Your skin for abnormal spots. Counseling Your health care provider may ask you questions about:  Alcohol, tobacco, and drug  use.  Emotional well-being.  Home and relationship well-being.  Sexual activity.  Eating habits.  History of falls.  Memory and ability to understand (cognition).  Work and work Statistician. What immunizations do I need?  Influenza (flu) vaccine  This is recommended every year. Tetanus, diphtheria, and pertussis (Tdap) vaccine  You may need a Td booster every 10 years. Varicella (chickenpox) vaccine  You may need this vaccine if you have not already been vaccinated. Zoster (shingles) vaccine  You may need this after age 74. Pneumococcal conjugate (PCV13) vaccine  One dose is recommended after age 44. Pneumococcal polysaccharide (PPSV23) vaccine  One dose is recommended after age 19. Measles, mumps, and rubella (MMR) vaccine  You may need at least one dose of MMR if you were born in 1957 or later. You may also need a second dose. Meningococcal conjugate (MenACWY) vaccine  You may need this if you have certain conditions. Hepatitis A vaccine  You may need this if you have certain conditions or if you travel or work in places where you may be exposed to hepatitis A. Hepatitis B vaccine  You may need this if you have certain conditions or if you travel or work in places where you may be exposed to hepatitis B. Haemophilus influenzae type b (Hib) vaccine  You may need this if you have certain conditions. You may receive vaccines as individual doses or as more than one vaccine together in one shot (combination vaccines). Talk with  your health care provider about the risks and benefits of combination vaccines. What tests do I need? Blood tests  Lipid and cholesterol levels. These may be checked every 5 years, or more frequently depending on your overall health.  Hepatitis C test.  Hepatitis B test. Screening  Lung cancer screening. You may have this screening every year starting at age 54 if you have a 30-pack-year history of smoking and currently smoke or have  quit within the past 15 years.  Colorectal cancer screening. All adults should have this screening starting at age 52 and continuing until age 33. Your health care provider may recommend screening at age 52 if you are at increased risk. You will have tests every 1-10 years, depending on your results and the type of screening test.  Prostate cancer screening. Recommendations will vary depending on your family history and other risks.  Diabetes screening. This is done by checking your blood sugar (glucose) after you have not eaten for a while (fasting). You may have this done every 1-3 years.  Abdominal aortic aneurysm (AAA) screening. You may need this if you are a current or former smoker.  Sexually transmitted disease (STD) testing. Follow these instructions at home: Eating and drinking  Eat a diet that includes fresh fruits and vegetables, whole grains, lean protein, and low-fat dairy products. Limit your intake of foods with high amounts of sugar, saturated fats, and salt.  Take vitamin and mineral supplements as recommended by your health care provider.  Do not drink alcohol if your health care provider tells you not to drink.  If you drink alcohol: ? Limit how much you have to 0-2 drinks a day. ? Be aware of how much alcohol is in your drink. In the [place] one drink equals one 12 oz bottle of beer (355 mL), one 5 oz glass of wine (148 mL), or one 1 oz glass of hard liquor (44 mL). Lifestyle  Take daily care of your teeth and gums.  Stay active. Exercise for at least 30 minutes on 5 or more days each week.  Do not use any products that contain nicotine or tobacco, such as cigarettes, e-cigarettes, and chewing tobacco. If you need help quitting, ask your health care provider.  If you are sexually active, practice safe sex. Use a condom or other form of protection to prevent STIs (sexually transmitted infections).  Talk with your health care provider about taking a low-dose aspirin  or statin. What's next?  Visit your health care provider once a year for a well check visit.  Ask your health care provider how often you should have your eyes and teeth checked.  Stay up to date on all vaccines. This information is not intended to replace advice given to you by your health care provider. Make sure you discuss any questions you have with your health care provider. Document Revised: 03/22/2018 Document Reviewed: 03/22/2018 Elsevier Patient Education  2020 [employer].

## 2019-09-02 NOTE — Progress Notes (Signed)
Subjective:   [name] [name] is a 85 y.o. male who presents for Medicare Annual/Subsequent preventive examination.  Review of Systems: Cardiac Risk Factors include: advanced age (>47men, >30 women);male gender;dyslipidemia;diabetes mellitus;hypertension Home Safety/Smoke Alarms: Feels safe in home. Smoke alarms in place.  Lives with nephew in 1 story home.   Male:    PSA-  Lab Results  Component Value Date   PSA 1.55 04/11/2014   PSA 3.43 06/08/2011   PSA 3.27 08/17/2010       Objective:    Vitals: BP 100/64 (BP Location: Left Arm, Patient Position: Sitting, Cuff Size: Normal)   Pulse 60   Temp (!) 97 F (36.1 C) (Oral)   Ht 5\' 5"  (1.651 m)   Wt 153 lb (69.4 kg)   SpO2 98%   BMI 25.46 kg/m   Body mass index is 25.46 kg/m.   Wt Readings from Last 3 Encounters:  09/02/19 153 lb (69.4 kg)  08/20/19 159 lb (72.1 kg)  07/18/19 166 lb 3.2 oz (75.4 kg)   Temp Readings from Last 3 Encounters:  09/02/19 (!) 97 F (36.1 C) (Oral)  04/25/19 (!) 97.2 F (36.2 C) (Temporal)  12/04/18 97.7 F (36.5 C) (Temporal)   BP Readings from Last 3 Encounters:  09/02/19 100/64  08/20/19 114/70  07/18/19 120/70   Pulse Readings from Last 3 Encounters:  09/02/19 60  08/20/19 86  07/18/19 84     Advanced Directives 09/02/2019 06/27/2019 06/18/2018 06/13/2016 09/03/2015 08/31/2015 06/23/2015  Does Patient Have a Medical Advance Directive? Yes Yes Yes Yes Yes Yes Yes  Type of Advance Directive Living will - Living will;Healthcare Power of [place];Living will [place];Living will [place];Living will [place];Living will  Does patient want to make changes to medical advance directive? No - Patient declined - - No - Patient declined No - Patient declined No - Patient declined No - Patient declined  Copy of Healthcare Power of Attorney in Chart? - - - No - copy requested No - copy requested No - copy  requested No - copy requested  Would patient like information on creating a medical advance directive? - - - - - - -    Tobacco Social History   Tobacco Use  Smoking Status Former Smoker  . Years: 2.00  . Types: Cigars  . Quit date: 04/12/1995  . Years since quitting: 24.4  Smokeless Tobacco Never Used  Tobacco Comment   chews cigars still      Counseling given: Not Answered Comment: chews cigars still    Clinical Intake:     Pain : No/denies pain     Past Medical History:  Diagnosis Date  . Diabetes mellitus 1955   type 2 - fasting 100-120  . Diverticulosis 2009  . GERD (gastroesophageal reflux disease)   . Glaucoma    bilateral  . History of shingles   . Hyperlipidemia    5 yrs  . Hypertension   . 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 Osteoarthritis    "knees" (01/31/2014)  . Prostate cancer ([place])    "no treatments; it's going away by itself w/the pills I take"   Past Surgical History:  Procedure Laterality Date  . ANTERIOR CERVICAL DECOMP/DISCECTOMY FUSION N/A 09/03/2015   Procedure: ANTERIOR CERVICAL DECOMPRESSION FUSION, CERVICAL 5-6 WITH INSTRUMENTATION AND ALLOGRAFT;  Surgeon: Phylliss Bob, MD;  Location: [facility];  Service: Orthopedics;  Laterality: N/A;  ANTERIOR CERVICAL DECOMPRESSION FUSION, CERVICAL 5-6 WITH INSTRUMENTATION AND ALLOGRAFT  . COLONOSCOPY    . FINE NEEDLE ASPIRATION Left 06/10/15   Knee  . FINE NEEDLE ASPIRATION  05/26/15   Back  . JOINT REPLACEMENT    . TONSILLECTOMY    . TOTAL KNEE ARTHROPLASTY Right 01/31/2014  . TOTAL KNEE ARTHROPLASTY Right 01/31/2014   Procedure: RIGHT TOTAL KNEE ARTHROPLASTY;  Surgeon: Kerin Salen, MD;  Location: [facility];  Service: Orthopedics;  Laterality: Right;   Family History  Problem Relation Age of Onset  . Arthritis Other   . Diabetes Other   . Cancer Other        prostate  . Heart disease Brother        questionable  . Colon cancer Daughter 59  . Diabetes Mother    . Colon polyps Neg Hx   . Esophageal cancer Neg Hx   . Rectal cancer Neg Hx   . Stomach cancer Neg Hx    Social History   Socioeconomic History  . Marital status: Widowed    Spouse name: Not on file  . Number of children: Not on file  . Years of education: Not on file  . 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 Smoking status: Former Smoker    Years: 2.00    Types: Cigars    Quit date: 04/12/1995    Years since quitting: 24.4  . Smokeless tobacco: Never Used  . Tobacco comment: chews cigars still   Substance and Sexual Activity  . Alcohol use: No  . Drug use: No  . Sexual activity: Never  Other Topics Concern  . Not on file  Social History Narrative   Works out in the garden   Social Determinants of Radio broadcast assistant Strain: [place]   . Difficulty of Paying Living Expenses: Not hard at all  Food Insecurity: No Food Insecurity  . Worried About Charity fundraiser in the Last Year: Never true  . Ran Out of Food in the Last Year: Never true  Transportation Needs: No Transportation Needs  . Lack of Transportation (Medical): No  . Lack of Transportation (Non-Medical): No  Physical Activity:   . Days of Exercise per Week:   . Minutes of Exercise per Session:   Stress:   . Feeling of Stress :   Social Connections:   . Frequency of Communication with Friends and Family:   . Frequency of Social Gatherings with Friends and Family:   . Attends Religious Services:   . Active Member of Clubs or Organizations:   . Attends Archivist Meetings:   Marland Kitchen Marital Status:     Outpatient Encounter Medications as of 09/02/2019  Medication Sig  . acetaminophen (TYLENOL) 500 MG tablet Take 500 mg by mouth every 6 (six) hours as needed.  . Ascorbic Acid (VITAMIN C) 1000 MG tablet Take 1,000 mg by mouth daily.  Marland Kitchen aspirin 81 MG chewable tablet Chew by mouth daily.  Marland Kitchen  atorvastatin (LIPITOR) 20 MG tablet TAKE 1 TABLET BY MOUTH EVERY DAY  . b complex vitamins tablet Take 1 tablet by mouth daily.  . Blood Glucose Monitoring Suppl (ACCU-CHEK AVIVA) device Use as instructed  . cholecalciferol (VITAMIN D3) 25 MCG (1000 UNIT) tablet Take 1,000 Units by mouth daily.  . Coenzyme Q10 (CO Q 10 PO) Take 50 mg by mouth daily.  Marland Kitchen  finasteride (PROSCAR) 5 MG tablet Take 5 mg by mouth daily.  . furosemide (LASIX) 20 MG tablet Take 1 tablet (20 mg total) by mouth every other day.  . gabapentin (NEURONTIN) 600 MG tablet Take 1 tablet (600 mg total) by mouth 3 (three) times daily.  Marland Kitchen glucose blood (ACCU-CHEK AVIVA PLUS) test strip 1 each by Other route daily. And lancets 1/day  . latanoprost (XALATAN) 0.005 % ophthalmic solution Place 1 drop into both eyes at bedtime.  . metFORMIN (GLUCOPHAGE) 1000 MG tablet TAKE 1 TABLET BY MOUTH 2 TIMES DAILY WITH A MEAL  . Omega-3 Fatty Acids (FISH OIL) 1000 MG CAPS Take 1 capsule by mouth 2 (two) times daily.  Marland Kitchen omeprazole (PRILOSEC) 20 MG capsule TAKE 1 CAPSULE BY MOUTH EVERY DAY  . ramipril (ALTACE) 2.5 MG capsule TAKE 1 CAPSULE BY MOUTH EVERY DAY  . repaglinide (PRANDIN) 2 MG tablet Take 1 tablet (2 mg total) by mouth 3 (three) times daily before meals.  . Semaglutide (RYBELSUS) 14 MG TABS Take 14 mg by mouth daily.  . traMADol (ULTRAM) 50 MG tablet Take 1 tablet by mouth daily.   No facility-administered encounter medications on file as of 09/02/2019.    Activities of Daily Living In your present state of health, do you have any difficulty performing the following activities: 09/02/2019  Hearing? Y  Comment has hearing aids  Vision? N  Walking or climbing stairs? N  Dressing or bathing? N  Doing errands, shopping? N  Preparing Food and eating ? N  Using the Toilet? N  In the past six months, have you accidently leaked urine? N  Do you have problems with loss of bowel control? N  Managing your Medications? N  Managing your  Finances? N  Housekeeping or managing your Housekeeping? N  Some recent data might be hidden    Patient Care Team: Carollee Herter, Alferd Apa, DO as PCP - General (Family Medicine) Festus Aloe, MD as Consulting Physician (Urology) Marylynn Pearson, MD as Consulting Physician (Ophthalmology) Frederik Pear, MD as Consulting Physician (Orthopedic Surgery) Neldon Newport as Physician Assistant (Orthopedic Surgery) Normajean Glasgow, MD as Attending Physician (Physical Medicine and Rehabilitation) Phylliss Bob, MD as Consulting Physician (Orthopedic Surgery) Renato Shin, MD as Consulting Physician (Endocrinology)   Assessment:   This is a routine wellness examination for [employer]. Physical assessment deferred to PCP.  Exercise Activities and Dietary recommendations Current Exercise Habits: The patient does not participate in regular exercise at present, Exercise limited by: None identified Diet (meal preparation, eat out, water intake, caffeinated beverages, dairy products, fruits and vegetables): 24 hr recall Breakfast:cereal,  Lunch: eggs, potatoes, sausage Dinner: meat and vegetable     Goals    . Maintain current health       Fall Risk Fall Risk  09/02/2019 06/27/2019 02/27/2018 12/26/2016 11/01/2016  Falls in the past year? 0 0 0 No No  Comment - - Emmi Telephone Survey: data to providers prior to load - Emmi Telephone Survey: data to providers prior to load  Number falls in past yr: 0 - - - -  Injury with Fall? 0 - - - -  Follow up Education provided;Falls prevention discussed - - - -     Depression Screen PHQ 2/9 Scores 09/02/2019 06/27/2019 05/31/2018 12/26/2016  PHQ - 2 Score 0 0 0 0  PHQ- 9 Score - - 0 -    Cognitive Function MMSE - Mini Mental State Exam 06/13/2016  Orientation to time 5  Orientation to  Place 5  Registration 3  Attention/ Calculation 5  Recall 3  Language- name 2 objects 2  Language- repeat 1  Language- follow 3 step command 3  Language- read & follow  direction 1  Write a sentence 1  Copy design 1  Total score 30        Immunization History  Administered Date(s) Administered  . Fluad Quad(high Dose 65+) 12/04/2018  . Influenza Split 12/21/2011  . Influenza Whole 12/19/2007, 01/16/2009, 01/27/2010  . Influenza, High Dose Seasonal PF 12/15/2014, 02/21/2018  . Influenza-Unspecified 01/01/2014, 12/24/2016  . Pneumococcal Conjugate-13 05/01/2014, 09/16/2014  . Pneumococcal Polysaccharide-23 12/19/2007, 04/25/2019  . Td 10/16/2007  . Zoster Recombinat (Shingrix) 07/06/2016, 10/05/2016    Screening Tests Health Maintenance  Topic Date Due  . COVID-19 Vaccine (1) Never done  . TETANUS/TDAP  10/15/2017  . OPHTHALMOLOGY EXAM  05/02/2018  . INFLUENZA VACCINE  11/10/2019  . HEMOGLOBIN A1C  02/20/2020  . FOOT EXAM  04/24/2020  . PNA vac Low Risk Adult  Completed       Plan:  Please schedule your next medicare wellness visit with me in 1 yr.  Continue to eat heart healthy diet (full of fruits, vegetables, whole grains, lean protein, water--limit salt, fat, and sugar intake) and increase physical activity as tolerated.  Continue doing brain stimulating activities (puzzles, reading, adult coloring books, staying active) to keep memory sharp.   Bring a copy of your living will and/or healthcare power of attorney to your next office visit.   I have personally reviewed and noted the following in the patient's chart:   . Medical and social history . Use of alcohol, tobacco or illicit drugs  . Current medications and supplements . Functional ability and status . Nutritional status . Physical activity . Advanced directives . List of other physicians . Hospitalizations, surgeries, and ER visits in previous 12 months . Vitals . Screenings to include cognitive, depression, and falls . Referrals and appointments  In addition, I have reviewed and discussed with patient certain preventive protocols, quality metrics, and best practice  recommendations. A written personalized care plan for preventive services as well as general preventive health recommendations were provided to patient.     Naaman Plummer [place], [place]  09/02/2019

## 2019-10-22 ENCOUNTER — Ambulatory Visit (INDEPENDENT_AMBULATORY_CARE_PROVIDER_SITE_OTHER): Payer: Medicare HMO | Admitting: Endocrinology

## 2019-10-22 ENCOUNTER — Other Ambulatory Visit: Payer: Self-pay

## 2019-10-22 VITALS — BP 122/62 | HR 76 | Ht 65.0 in | Wt 155.6 lb

## 2019-10-22 DIAGNOSIS — E1165 Type 2 diabetes mellitus with hyperglycemia: Secondary | ICD-10-CM | POA: Diagnosis not present

## 2019-10-22 DIAGNOSIS — IMO0002 Reserved for concepts with insufficient information to code with codable children: Secondary | ICD-10-CM

## 2019-10-22 DIAGNOSIS — E1139 Type 2 diabetes mellitus with other diabetic ophthalmic complication: Secondary | ICD-10-CM | POA: Diagnosis not present

## 2019-10-22 LAB — POCT GLYCOSYLATED HEMOGLOBIN (HGB A1C): Hemoglobin A1C: 5.5 % (ref 4.0–5.6)

## 2019-10-22 MED ORDER — REPAGLINIDE 2 MG PO TABS: 2.0000 mg | ORAL_TABLET | Freq: Two times a day (BID) | ORAL | 3 refills | Status: DC

## 2019-10-22 MED ORDER — RYBELSUS 14 MG PO TABS: 14.0000 mg | ORAL_TABLET | Freq: Every day | ORAL | 3 refills | Status: DC

## 2019-10-22 NOTE — Progress Notes (Signed)
Subjective:    Patient ID: [name] [name], male    DOB: 10-24-1934, 85 y.o.   MRN: [id number]  HPI  Pt returns for f/u of diabetes mellitus: DM type: 2 Dx'ed: 2778 Complications: CRI, PAD, and DR Therapy: 4 oral meds DKA: never Severe hypoglycemia: never Pancreatitis: never Pancreatic imaging: normal on 2013 CT SDOH: none Other: he has never been on insulin, and he declines; renal insuff and edema limit rx options.   Interval history: no cbg record, but states cbg's vary from 95-100's.  He takes meds as rx'ed.  pt states he feels well in general.   Past Medical History:  Diagnosis Date  . Diabetes mellitus 1955   type 2 - fasting 100-120  . Diverticulosis 2009  . GERD (gastroesophageal reflux disease)   . Glaucoma    bilateral  . History of shingles   . Hyperlipidemia    5 yrs  . Hypertension   . 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 Osteoarthritis    "knees" (01/31/2014)  . Prostate cancer ([place])    "no treatments; it's going away by itself w/the pills I take"    Past Surgical History:  Procedure Laterality Date  . ANTERIOR CERVICAL DECOMP/DISCECTOMY FUSION N/A 09/03/2015   Procedure: ANTERIOR CERVICAL DECOMPRESSION FUSION, CERVICAL 5-6 WITH INSTRUMENTATION AND ALLOGRAFT;  Surgeon: Phylliss Bob, MD;  Location: [facility];  Service: Orthopedics;  Laterality: N/A;  ANTERIOR CERVICAL DECOMPRESSION FUSION, CERVICAL 5-6 WITH INSTRUMENTATION AND ALLOGRAFT  . COLONOSCOPY    . FINE NEEDLE ASPIRATION Left 06/10/15   Knee  . FINE NEEDLE ASPIRATION  05/26/15   Back  . JOINT REPLACEMENT    . TONSILLECTOMY    . TOTAL KNEE ARTHROPLASTY Right 01/31/2014  . TOTAL KNEE ARTHROPLASTY Right 01/31/2014   Procedure: RIGHT TOTAL KNEE ARTHROPLASTY;  Surgeon: Kerin Salen, MD;  Location: [facility];  Service: Orthopedics;  Laterality: Right;    Social History   Socioeconomic History  . Marital status: Widowed    Spouse name: Not on file  . Number of children:  Not on file  . Years of education: Not on file  . 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 Smoking status: Former Smoker    Years: 2.00    Types: Cigars    Quit date: 04/12/1995    Years since quitting: 24.5  . Smokeless tobacco: Never Used  . Tobacco comment: chews cigars still   Vaping Use  . Vaping Use: Never used  Substance and Sexual Activity  . Alcohol use: No  . Drug use: No  . Sexual activity: Never  Other Topics Concern  . Not on file  Social History Narrative   Works out in the garden   Social Determinants of Radio broadcast assistant Strain: [place]   . Difficulty of Paying Living Expenses: Not hard at all  Food Insecurity: No Food Insecurity  . Worried About Charity fundraiser in the Last Year: Never true  . Ran Out of Food in the Last Year: Never true  Transportation Needs: No Transportation Needs  . Lack of Transportation (Medical): No  . Lack of Transportation (Non-Medical): No  Physical Activity:   . Days of Exercise per Week:   . Minutes of Exercise per Session:   Stress:   . Feeling of Stress :   Social Connections:   .  Frequency of Communication with Friends and Family:   . Frequency of Social Gatherings with Friends and Family:   . Attends Religious Services:   . Active Member of Clubs or Organizations:   . Attends Archivist Meetings:   Marland Kitchen Marital Status:   Intimate Partner Violence:   . Fear of Current or Ex-Partner:   . Emotionally Abused:   Marland Kitchen Physically Abused:   . Sexually Abused:     Current Outpatient Medications on File Prior to Visit  Medication Sig Dispense Refill  . glucose blood (ACCU-CHEK AVIVA PLUS) test strip 1 each by Other route daily. And lancets 1/day 100 each 12  . metFORMIN (GLUCOPHAGE) 1000 MG tablet TAKE 1 TABLET BY MOUTH 2 TIMES DAILY WITH A MEAL 180 tablet 3  . repaglinide (PRANDIN) 2 MG tablet Take 1 tablet (2 mg  total) by mouth 3 (three) times daily before meals. 270 tablet 3  . Semaglutide (RYBELSUS) 14 MG TABS Take 14 mg by mouth daily. 90 tablet 3  . acetaminophen (TYLENOL) 500 MG tablet Take 500 mg by mouth every 6 (six) hours as needed.    . Ascorbic Acid (VITAMIN C) 1000 MG tablet Take 1,000 mg by mouth daily.    Marland Kitchen aspirin 81 MG chewable tablet Chew by mouth daily.    Marland Kitchen atorvastatin (LIPITOR) 20 MG tablet TAKE 1 TABLET BY MOUTH EVERY DAY 90 tablet 1  . b complex vitamins tablet Take 1 tablet by mouth daily.    . Blood Glucose Monitoring Suppl (ACCU-CHEK AVIVA) device Use as instructed 1 each 0  . cholecalciferol (VITAMIN D3) 25 MCG (1000 UNIT) tablet Take 1,000 Units by mouth daily.    . Coenzyme Q10 (CO Q 10 PO) Take 50 mg by mouth daily.    . finasteride (PROSCAR) 5 MG tablet Take 5 mg by mouth daily.    . furosemide (LASIX) 20 MG tablet Take 1 tablet (20 mg total) by mouth every other day. 90 tablet 3  . gabapentin (NEURONTIN) 600 MG tablet Take 1 tablet (600 mg total) by mouth 3 (three) times daily. 270 tablet 3  . latanoprost (XALATAN) 0.005 % ophthalmic solution Place 1 drop into both eyes at bedtime.    . Omega-3 Fatty Acids (FISH OIL) 1000 MG CAPS Take 1 capsule by mouth 2 (two) times daily.    Marland Kitchen omeprazole (PRILOSEC) 20 MG capsule TAKE 1 CAPSULE BY MOUTH EVERY DAY 90 capsule 1  . ramipril (ALTACE) 2.5 MG capsule TAKE 1 CAPSULE BY MOUTH EVERY DAY 90 capsule 3  . traMADol (ULTRAM) 50 MG tablet Take 1 tablet by mouth daily.  0   No current facility-administered medications on file prior to visit.    No Known Allergies  Family History  Problem Relation Age of Onset  . Arthritis Other   . Diabetes Other   . Cancer Other        prostate  . Heart disease Brother        questionable  . Colon cancer Daughter 87  . Diabetes Mother   . Colon polyps Neg Hx   . Esophageal cancer Neg Hx   . Rectal cancer Neg Hx   . Stomach cancer Neg Hx     BP 122/62 (BP Location: Left Arm, Patient  Position: Sitting)   Pulse 76   Ht 5\' 5"  (1.651 m)   Wt 155 lb 9.6 oz (70.6 kg)   SpO2 93%   BMI 25.89 kg/m   ROS: He denies hypoglycemia.  Objective:   Physical Exam VITAL SIGNS:  See vs page GENERAL: no distress Pulses: dorsalis pedis intact bilat.   MSK: no deformity of the feet CV: no leg edema Skin:  no ulcer on the feet.  normal color and temp on the feet. Neuro: sensation is intact to touch on the feet.  Lab Results  Component Value Date   HGBA1C 5.5 10/22/2019        Assessment & Plan:  Type 2 DM: overcontrolled.   CRI: This limits rx options.   Patient Instructions  I have sent a prescription to your pharmacy, to reduce the repaglinide to twice a day. Please continue the same other medications. check your blood sugar once a day.  vary the time of day when you check, between before the 3 meals, and at bedtime.  also check if you have symptoms of your blood sugar being too high or too low.  please keep a record of the readings and bring it to your next appointment here (or you can bring the meter itself).  You can write it on any piece of paper.  please call us sooner if your blood sugar goes below 70, or if you have a lot of readings over 200. Please come back for a follow-up appointment in 3 months

## 2019-10-22 NOTE — Patient Instructions (Signed)
I have sent a prescription to your pharmacy, to reduce the repaglinide to twice a day. Please continue the same other medications. check your blood sugar once a day.  vary the time of day when you check, between before the 3 meals, and at bedtime.  also check if you have symptoms of your blood sugar being too high or too low.  please keep a record of the readings and bring it to your next appointment here (or you can bring the meter itself).  You can write it on any piece of paper.  please call us sooner if your blood sugar goes below 70, or if you have a lot of readings over 200. Please come back for a follow-up appointment in 3 months

## 2019-10-24 DIAGNOSIS — H401131 Primary open-angle glaucoma, bilateral, mild stage: Secondary | ICD-10-CM | POA: Diagnosis not present

## 2019-10-24 DIAGNOSIS — H2513 Age-related nuclear cataract, bilateral: Secondary | ICD-10-CM | POA: Diagnosis not present

## 2019-10-24 DIAGNOSIS — E119 Type 2 diabetes mellitus without complications: Secondary | ICD-10-CM | POA: Diagnosis not present

## 2019-10-28 LAB — HM DIABETES EYE EXAM

## 2019-11-09 DIAGNOSIS — M542 Cervicalgia: Secondary | ICD-10-CM | POA: Diagnosis not present

## 2019-12-06 DIAGNOSIS — M542 Cervicalgia: Secondary | ICD-10-CM | POA: Diagnosis not present

## 2019-12-06 DIAGNOSIS — M5412 Radiculopathy, cervical region: Secondary | ICD-10-CM | POA: Diagnosis not present

## 2019-12-10 ENCOUNTER — Telehealth: Payer: Self-pay

## 2019-12-10 ENCOUNTER — Encounter: Payer: Self-pay | Admitting: Endocrinology

## 2019-12-10 ENCOUNTER — Other Ambulatory Visit: Payer: Self-pay

## 2019-12-10 ENCOUNTER — Ambulatory Visit (INDEPENDENT_AMBULATORY_CARE_PROVIDER_SITE_OTHER): Payer: Medicare HMO | Admitting: Endocrinology

## 2019-12-10 VITALS — BP 130/70 | HR 54 | Ht 65.0 in | Wt 149.0 lb

## 2019-12-10 DIAGNOSIS — E1165 Type 2 diabetes mellitus with hyperglycemia: Secondary | ICD-10-CM | POA: Diagnosis not present

## 2019-12-10 DIAGNOSIS — E1139 Type 2 diabetes mellitus with other diabetic ophthalmic complication: Secondary | ICD-10-CM | POA: Diagnosis not present

## 2019-12-10 DIAGNOSIS — IMO0002 Reserved for concepts with insufficient information to code with codable children: Secondary | ICD-10-CM

## 2019-12-10 LAB — POCT GLYCOSYLATED HEMOGLOBIN (HGB A1C): Hemoglobin A1C: 5.7 % — AB (ref 4.0–5.6)

## 2019-12-10 LAB — BASIC METABOLIC PANEL
BUN: 18 mg/dL (ref 6–23)
CO2: 30 mEq/L (ref 19–32)
Calcium: 9.4 mg/dL (ref 8.4–10.5)
Chloride: 102 mEq/L (ref 96–112)
Creatinine, Ser: 1.1 mg/dL (ref 0.40–1.50)
GFR: 77.31 mL/min (ref >60.00)
Glucose, Bld: 83 mg/dL (ref 70–99)
Potassium: 4.5 mEq/L (ref 3.5–5.1)
Sodium: 138 mEq/L (ref 135–145)

## 2019-12-10 LAB — CBC WITH DIFFERENTIAL/PLATELET
Basophils Absolute: 0 10*3/uL (ref 0.0–0.1)
Basophils Relative: 0.4 % (ref 0.0–3.0)
Eosinophils Absolute: 0.2 10*3/uL (ref 0.0–0.7)
Eosinophils Relative: 4.3 % (ref 0.0–5.0)
HCT: 35.2 % — ABNORMAL LOW (ref 39.0–52.0)
Hemoglobin: 11.6 g/dL — ABNORMAL LOW (ref 13.0–17.0)
Lymphocytes Relative: 39.4 % (ref 12.0–46.0)
Lymphs Abs: 1.7 10*3/uL (ref 0.7–4.0)
MCHC: 32.8 g/dL (ref 30.0–36.0)
MCV: 99 fl (ref 78.0–100.0)
Monocytes Absolute: 0.5 10*3/uL (ref 0.1–1.0)
Monocytes Relative: 12 % (ref 3.0–12.0)
Neutro Abs: 1.9 10*3/uL (ref 1.4–7.7)
Neutrophils Relative %: 43.9 % (ref 43.0–77.0)
Platelets: 257 10*3/uL (ref 150.0–400.0)
RBC: 3.56 Mil/uL — ABNORMAL LOW (ref 4.22–5.81)
RDW: 13.8 % (ref 11.5–15.5)
WBC: 4.3 10*3/uL (ref 4.0–10.5)

## 2019-12-10 LAB — HEPATIC FUNCTION PANEL
ALT: 9 U/L (ref 0–53)
AST: 12 U/L (ref 0–37)
Albumin: 4.1 g/dL (ref 3.5–5.2)
Alkaline Phosphatase: 55 U/L (ref 39–117)
Bilirubin, Direct: 0.1 mg/dL (ref 0.0–0.3)
Total Bilirubin: 0.7 mg/dL (ref 0.2–1.2)
Total Protein: 6.5 g/dL (ref 6.0–8.3)

## 2019-12-10 LAB — TSH: TSH: 2.44 u[IU]/mL (ref 0.35–4.50)

## 2019-12-10 MED ORDER — RYBELSUS 7 MG PO TABS: 7.0000 mg | ORAL_TABLET | Freq: Every day | ORAL | 3 refills | Status: DC

## 2019-12-10 NOTE — Progress Notes (Signed)
Subjective:    Patient ID: [name] [name], male    DOB: June 07, 1934, 85 y.o.   MRN: [id number]  HPI Pt returns for f/u of diabetes mellitus: DM type: 2 Dx'ed: 1749 Complications: CRI, PAD, and DR Therapy: 4 oral meds DKA: never Severe hypoglycemia: never Pancreatitis: never Pancreatic imaging: normal on 2013 CT SDOH: none Other: he has never been on insulin, and he declines; renal insuff and edema limit rx options.   Interval history: no cbg record, but states cbg's vary from 79-140.  He takes meds as rx'ed.  pt states he feels well in general.  Past Medical History:  Diagnosis Date  . Diabetes mellitus 1955   type 2 - fasting 100-120  . Diverticulosis 2009  . GERD (gastroesophageal reflux disease)   . Glaucoma    bilateral  . History of shingles   . Hyperlipidemia    5 yrs  . Hypertension   . 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 Osteoarthritis    "knees" (01/31/2014)  . Prostate cancer ([place])    "no treatments; it's going away by itself w/the pills I take"    Past Surgical History:  Procedure Laterality Date  . ANTERIOR CERVICAL DECOMP/DISCECTOMY FUSION N/A 09/03/2015   Procedure: ANTERIOR CERVICAL DECOMPRESSION FUSION, CERVICAL 5-6 WITH INSTRUMENTATION AND ALLOGRAFT;  Surgeon: Phylliss Bob, MD;  Location: [facility];  Service: Orthopedics;  Laterality: N/A;  ANTERIOR CERVICAL DECOMPRESSION FUSION, CERVICAL 5-6 WITH INSTRUMENTATION AND ALLOGRAFT  . COLONOSCOPY    . FINE NEEDLE ASPIRATION Left 06/10/15   Knee  . FINE NEEDLE ASPIRATION  05/26/15   Back  . JOINT REPLACEMENT    . TONSILLECTOMY    . TOTAL KNEE ARTHROPLASTY Right 01/31/2014  . TOTAL KNEE ARTHROPLASTY Right 01/31/2014   Procedure: RIGHT TOTAL KNEE ARTHROPLASTY;  Surgeon: Kerin Salen, MD;  Location: [facility];  Service: Orthopedics;  Laterality: Right;    Social History   Socioeconomic History  . Marital status: Widowed    Spouse name: Not on file  . Number of children: Not on  file  . Years of education: Not on file  . 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 Smoking status: Former Smoker    Years: 2.00    Types: Cigars    Quit date: 04/12/1995    Years since quitting: 24.6  . Smokeless tobacco: Never Used  . Tobacco comment: chews cigars still   Vaping Use  . Vaping Use: Never used  Substance and Sexual Activity  . Alcohol use: No  . Drug use: No  . Sexual activity: Never  Other Topics Concern  . Not on file  Social History Narrative   Works out in the garden   Social Determinants of Radio broadcast assistant Strain: [place]   . Difficulty of Paying Living Expenses: Not hard at all  Food Insecurity: No Food Insecurity  . Worried About Charity fundraiser in the Last Year: Never true  . Ran Out of Food in the Last Year: Never true  Transportation Needs: No Transportation Needs  . Lack of Transportation (Medical): No  . Lack of Transportation (Non-Medical): No  Physical Activity:   . Days of Exercise per Week: Not on file  . Minutes of Exercise per Session: Not on file  Stress:   . Feeling of Stress : Not on  file  Social Connections:   . Frequency of Communication with Friends and Family: Not on file  . Frequency of Social Gatherings with Friends and Family: Not on file  . Attends Religious Services: Not on file  . Active Member of Clubs or Organizations: Not on file  . Attends Archivist Meetings: Not on file  . Marital Status: Not on file  Intimate Partner Violence:   . Fear of Current or Ex-Partner: Not on file  . Emotionally Abused: Not on file  . Physically Abused: Not on file  . Sexually Abused: Not on file    Current Outpatient Medications on File Prior to Visit  Medication Sig Dispense Refill  . acetaminophen (TYLENOL) 500 MG tablet Take 500 mg by mouth every 6 (six) hours as needed.    . Ascorbic Acid (VITAMIN C) 1000 MG tablet  Take 1,000 mg by mouth daily.    Marland Kitchen aspirin 81 MG chewable tablet Chew by mouth daily.    Marland Kitchen atorvastatin (LIPITOR) 20 MG tablet TAKE 1 TABLET BY MOUTH EVERY DAY 90 tablet 1  . b complex vitamins tablet Take 1 tablet by mouth daily.    . Blood Glucose Monitoring Suppl (ACCU-CHEK AVIVA) device Use as instructed 1 each 0  . cholecalciferol (VITAMIN D3) 25 MCG (1000 UNIT) tablet Take 1,000 Units by mouth daily.    . Coenzyme Q10 (CO Q 10 PO) Take 50 mg by mouth daily.    . finasteride (PROSCAR) 5 MG tablet Take 5 mg by mouth daily.    . furosemide (LASIX) 20 MG tablet Take 1 tablet (20 mg total) by mouth every other day. 90 tablet 3  . gabapentin (NEURONTIN) 600 MG tablet Take 1 tablet (600 mg total) by mouth 3 (three) times daily. 270 tablet 3  . glucose blood (ACCU-CHEK AVIVA PLUS) test strip 1 each by Other route daily. And lancets 1/day 100 each 12  . latanoprost (XALATAN) 0.005 % ophthalmic solution Place 1 drop into both eyes at bedtime.    . metFORMIN (GLUCOPHAGE) 1000 MG tablet TAKE 1 TABLET BY MOUTH 2 TIMES DAILY WITH A MEAL 180 tablet 3  . Omega-3 Fatty Acids (FISH OIL) 1000 MG CAPS Take 1 capsule by mouth 2 (two) times daily.    Marland Kitchen omeprazole (PRILOSEC) 20 MG capsule TAKE 1 CAPSULE BY MOUTH EVERY DAY 90 capsule 1  . ramipril (ALTACE) 2.5 MG capsule TAKE 1 CAPSULE BY MOUTH EVERY DAY 90 capsule 3  . repaglinide (PRANDIN) 2 MG tablet Take 1 tablet (2 mg total) by mouth 2 (two) times daily before a meal. 180 tablet 3  . traMADol (ULTRAM) 50 MG tablet Take 1 tablet by mouth daily.  0   No current facility-administered medications on file prior to visit.    No Known Allergies  Family History  Problem Relation Age of Onset  . Arthritis Other   . Diabetes Other   . Cancer Other        prostate  . Heart disease Brother        questionable  . Colon cancer Daughter 21  . Diabetes Mother   . Colon polyps Neg Hx   . Esophageal cancer Neg Hx   . Rectal cancer Neg Hx   . Stomach cancer Neg  Hx     BP 130/70   Pulse (!) 54   Ht 5\' 5"  (1.651 m)   Wt 149 lb (67.6 kg)   SpO2 97%   BMI 24.79 kg/m    Review  of Systems He has lost 6 more lbs.      Objective:   Physical Exam VITAL SIGNS:  See vs page GENERAL: no distress Pulses: dorsalis pedis intact bilat.   MSK: no deformity of the feet CV: no leg edema Skin:  no ulcer on the feet.  normal color and temp on the feet. Neuro: sensation is intact to touch on the feet  Lab Results  Component Value Date   HGBA1C 5.7 (A) 12/10/2019    Lab Results  Component Value Date   WBC 4.3 12/10/2019   HGB 11.6 (L) 12/10/2019   HCT 35.2 (L) 12/10/2019   MCV 99.0 12/10/2019   PLT 257.0 12/10/2019       Assessment & Plan:  Weight loss: we discussed.  This could be due to Rybelsus, he should have this checked out with Dr L-C, especially in view of anemia Type 2 DM, with PAD: well-controlled  Patient Instructions  I have sent a prescription to your pharmacy, to reduce the Rybelsus to 7 mg daily.  Please continue the same other medications. check your blood sugar once a day.  vary the time of day when you check, between before the 3 meals, and at bedtime.  also check if you have symptoms of your blood sugar being too high or too low.  please keep a record of the readings and bring it to your next appointment here (or you can bring the meter itself).  You can write it on any piece of paper.  please call us sooner if your blood sugar goes below 70, or if you have a lot of readings over 200. Please come back for a follow-up appointment in 2 months.

## 2019-12-10 NOTE — Telephone Encounter (Signed)
RESULTS  Results were reviewed by Dr. Loanne Drilling. Called pt to inform about results as well as recommendation. LVM requesting returned call.

## 2019-12-10 NOTE — Patient Instructions (Addendum)
I have sent a prescription to your pharmacy, to reduce the Rybelsus to 7 mg daily.  Please continue the same other medications. check your blood sugar once a day.  vary the time of day when you check, between before the 3 meals, and at bedtime.  also check if you have symptoms of your blood sugar being too high or too low.  please keep a record of the readings and bring it to your next appointment here (or you can bring the meter itself).  You can write it on any piece of paper.  please call us sooner if your blood sugar goes below 70, or if you have a lot of readings over 200. Please come back for a follow-up appointment in 2 months.

## 2019-12-10 NOTE — Telephone Encounter (Signed)
-----   Message from Renato Shin, MD sent at 12/10/2019 12:59 PM EDT ----- please contact patient: Normal results, except for anemia.  Please see Dr Mikle Bosworth, to follow this up.  This is important, because I am not sure the rybelsus is causing your weight loss.

## 2019-12-11 NOTE — Telephone Encounter (Signed)
FINAL ATTEMPT:  Again called pt to inform about lab results and Dr. Cordelia Pen recommendation. LVM requesting returned call. Letter has now been mailed.

## 2019-12-11 NOTE — Telephone Encounter (Signed)
SECOND ATTEMPT: ° °LVM requesting returned call. °

## 2019-12-26 ENCOUNTER — Encounter: Payer: Self-pay | Admitting: Family Medicine

## 2019-12-26 ENCOUNTER — Other Ambulatory Visit: Payer: Self-pay

## 2019-12-26 ENCOUNTER — Ambulatory Visit (INDEPENDENT_AMBULATORY_CARE_PROVIDER_SITE_OTHER): Payer: Medicare HMO | Admitting: Family Medicine

## 2019-12-26 VITALS — BP 120/60 | HR 100 | Temp 98.2°F | Resp 18 | Ht 65.0 in | Wt 147.2 lb

## 2019-12-26 DIAGNOSIS — D509 Iron deficiency anemia, unspecified: Secondary | ICD-10-CM | POA: Diagnosis not present

## 2019-12-26 DIAGNOSIS — Z23 Encounter for immunization: Secondary | ICD-10-CM | POA: Diagnosis not present

## 2019-12-26 DIAGNOSIS — E785 Hyperlipidemia, unspecified: Secondary | ICD-10-CM | POA: Diagnosis not present

## 2019-12-26 DIAGNOSIS — IMO0002 Reserved for concepts with insufficient information to code with codable children: Secondary | ICD-10-CM

## 2019-12-26 DIAGNOSIS — E1139 Type 2 diabetes mellitus with other diabetic ophthalmic complication: Secondary | ICD-10-CM

## 2019-12-26 DIAGNOSIS — E1165 Type 2 diabetes mellitus with hyperglycemia: Secondary | ICD-10-CM | POA: Diagnosis not present

## 2019-12-26 DIAGNOSIS — R634 Abnormal weight loss: Secondary | ICD-10-CM | POA: Diagnosis not present

## 2019-12-26 DIAGNOSIS — I10 Essential (primary) hypertension: Secondary | ICD-10-CM

## 2019-12-26 DIAGNOSIS — E1169 Type 2 diabetes mellitus with other specified complication: Secondary | ICD-10-CM | POA: Diagnosis not present

## 2019-12-26 DIAGNOSIS — Z8546 Personal history of malignant neoplasm of prostate: Secondary | ICD-10-CM | POA: Diagnosis not present

## 2019-12-26 MED ORDER — MIRTAZAPINE 15 MG PO TABS: 15.0000 mg | ORAL_TABLET | Freq: Every day | ORAL | 2 refills | Status: DC

## 2019-12-26 NOTE — Patient Instructions (Signed)
Preventing Complications From Unhealthy Weight Loss Behaviors, Adult °Reaching and maintaining a healthy weight is important for your overall health. It is natural to want to lose weight quickly, using whatever methods seem fastest. However, losing weight in a healthy way is not a quick process. Instead, aim for slow, steady weight loss. °What lifestyle changes can be made? °You can make certain lifestyle changes to help you lose weight in a healthy way. These include eating nutritious foods and exercising regularly. °What to avoid: °· Following a diet that restricts entire types of food. °· Skipping meals to save calories. It is especially important to eat breakfast. °· Not eating anything for long periods of time (fasting). °· Restricting your calories to far less than the amount that you need to lose or maintain a healthy weight. °· Compulsively getting an extreme amount of exercise. °· Taking laxative pills to make you have more frequent bowel movements. °· Taking medicines to make your body lose excess fluids (diuretics). °· Eating an excessive amount of food (bingeing), then making yourself vomit (purging). °Healthy behaviors: ° °· Eat a variety of healthy foods, including: °? Fruits and vegetables. °? Whole grains. °? Lean proteins. °? Low-fat dairy products. °· Drink water instead of sugary drinks. °· Plan healthy, low-calorie meals. Work with a nutrition specialist (dietitian) to make a healthy meal plan that works for you and to make an exercise program. °? Include different types of exercise in your exercise program, such as strengthening, aerobic, and flexibility exercises. °? To maintain your weight, get at least 150 minutes of moderate-intensity exercise every week. Moderate-intensity exercise could be brisk walking or biking. °? To lose a healthy amount of weight, get 60 minutes of moderate-intensity exercise each day. °· Find ways to reduce stress, such as regular exercise or meditation. °· Find a  hobby or other activity that you enjoy to distract you from eating when you feel stressed or bored. °Why are these changes important? °Making these changes will improve your overall health. Maintaining a healthy weight also lowers your risk of certain conditions, including: °· Heart disease. °· High cholesterol. °· High blood pressure. °· Type 2 diabetes. °· Stroke. °· Osteoarthritis. °· Osteoporosis. °· Some types of cancer. °· Breathing and sleeping disorders. °What can happen if changes are not made? °Using disordered eating or other unhealthy eating behaviors to try to lose weight can cause: °· Fatigue. °· Imbalances in electrolytes and chemicals that your body needs to work properly. °· Organ damage or failure, especially of the kidneys. °· Dehydration. °· Imbalances in body fluids. °· Low heart rate and blood pressure. °· Thin bones that break easily. °· Social isolation or relationship problems with your friends and family. °· Emotional distress, including depression and anxiety. °· A greater risk of an eating disorder. °If you develop an eating disorder, you could develop serious health problems and complications that affect your organs and bodily processes. These include: °· Dry skin and hair. °· Hair loss. °· Fainting. °· Difficulty getting pregnant. °· Changes in your heart muscle and the way your heart works. °· Severe dehydration. °· Damage to the digestive tract and digestive problems. °· Long-term (chronic) gastrointestinal problems. °· High blood pressure. °· High cholesterol. °· Heart disease. °· Type 2 diabetes. °Where to find support °For more support, talk with: °· Your health care provider or dietitian. Ask about support groups. °· A mental health care provider. °· Family and friends. °Where to find more information °Learn more about how to prevent complications   from unhealthy weight loss behaviors from: °· Centers for Disease Control and Prevention:  www.cdc.gov/healthyweight/losing_weight/getting_started.html °· National Institute of Mental Health: www.nimh.nih.gov/health/publications/eating-disorders-new-trifold/index.shtml °· National Eating Disorders Association: www.nationaleatingdisorders.org °Contact a health care provider if: °· You often feel very tired. °· You notice changes in your skin or your hair. °· You faint because of dehydration or too much exercise. °· You struggle to change your unhealthy weight loss behaviors on your own. °· Unhealthy weight loss behaviors are affecting your daily life. °· You have signs or symptoms of an eating disorder. °· You have major weight changes in a short period of time. °· You feel guilty or ashamed about eating or exercising. °· You have trouble with your relationships because of your weight loss habits. °Summary °· Aim for slow, steady weight loss by choosing healthy foods, getting enough calories every day, and exercising regularly. °· If you cannot make these changes by yourself, or if you think that you may have an eating disorder, contact your health care provider. °This information is not intended to replace advice given to you by your health care provider. Make sure you discuss any questions you have with your health care provider. °Document Revised: 03/10/2017 Document Reviewed: 04/12/2015 °Elsevier Patient Education © 2020 Elsevier Inc. ° °

## 2019-12-26 NOTE — Progress Notes (Signed)
Patient ID: [name] [name], male    DOB: [birth year]  Age: 85 y.o. MRN: [id number]     Subjective:  Subjective  HPI [name] [name] presents for f/u dm , chol and bp and c/o weight loss    He is concerned about the weight loss.     Review of Systems  Constitutional: Positive for unexpected weight change. Negative for appetite change, diaphoresis and fatigue.  Eyes: Negative for pain, redness and visual disturbance.  Respiratory: Negative for cough, chest tightness, shortness of breath and wheezing.   Cardiovascular: Negative for chest pain, palpitations and leg swelling.  Gastrointestinal: Negative for abdominal pain and blood in stool.  Endocrine: Negative for cold intolerance, heat intolerance, polydipsia, polyphagia and polyuria.  Genitourinary: Negative for difficulty urinating, dysuria and frequency.  Neurological: Negative for dizziness, light-headedness, numbness and headaches.    History Past Medical History:  Diagnosis Date  . Diabetes mellitus 1955   type 2 - fasting 100-120  . Diverticulosis 2009  . GERD (gastroesophageal reflux disease)   . Glaucoma    bilateral  . History of shingles   . Hyperlipidemia    5 yrs  . Hypertension   . Neuromuscular disorder ([place])    due to shingles around abdomen area   . Osteoarthritis    "knees" (01/31/2014)  . Prostate cancer ([place])    "no treatments; it's going away by itself w/the pills I take"    He has a past surgical history that includes Colonoscopy; Total knee arthroplasty (Right, 01/31/2014); Tonsillectomy; Total knee arthroplasty (Right, 01/31/2014); Joint replacement; Fine needle aspiration (Left, 06/10/15); Fine needle aspiration (05/26/15); and Anterior cervical decomp/discectomy fusion (N/A, 09/03/2015).   His family history includes Arthritis in an other family member; Cancer in an other family member; Colon cancer (age of onset: 68) in his daughter; Diabetes in his mother and another family member; Heart disease in his  brother.He reports that he quit smoking about 24 years ago. His smoking use included cigars. He quit after 2.00 years of use. He has never used smokeless tobacco. He reports that he does not drink alcohol and does not use drugs.  Current Outpatient Medications on File Prior to Visit  Medication Sig Dispense Refill  . acetaminophen (TYLENOL) 500 MG tablet Take 500 mg by mouth every 6 (six) hours as needed.    . Ascorbic Acid (VITAMIN C) 1000 MG tablet Take 1,000 mg by mouth daily.    Marland Kitchen aspirin 81 MG chewable tablet Chew by mouth daily.    Marland Kitchen atorvastatin (LIPITOR) 20 MG tablet TAKE 1 TABLET BY MOUTH EVERY DAY 90 tablet 1  . b complex vitamins tablet Take 1 tablet by mouth daily.    . Blood Glucose Monitoring Suppl (ACCU-CHEK AVIVA) device Use as instructed 1 each 0  . cholecalciferol (VITAMIN D3) 25 MCG (1000 UNIT) tablet Take 1,000 Units by mouth daily.    . Coenzyme Q10 (CO Q 10 PO) Take 50 mg by mouth daily.    . finasteride (PROSCAR) 5 MG tablet Take 5 mg by mouth daily.    . furosemide (LASIX) 20 MG tablet Take 1 tablet (20 mg total) by mouth every other day. 90 tablet 3  . gabapentin (NEURONTIN) 600 MG tablet Take 1 tablet (600 mg total) by mouth 3 (three) times daily. 270 tablet 3  . glucose blood (ACCU-CHEK AVIVA PLUS) test strip 1 each by Other route daily. And lancets 1/day 100 each 12  . latanoprost (XALATAN) 0.005 % ophthalmic solution Place 1 drop into both  eyes at bedtime.    . metFORMIN (GLUCOPHAGE) 1000 MG tablet TAKE 1 TABLET BY MOUTH 2 TIMES DAILY WITH A MEAL 180 tablet 3  . omeprazole (PRILOSEC) 20 MG capsule TAKE 1 CAPSULE BY MOUTH EVERY DAY 90 capsule 1  . ramipril (ALTACE) 2.5 MG capsule TAKE 1 CAPSULE BY MOUTH EVERY DAY 90 capsule 3  . repaglinide (PRANDIN) 2 MG tablet Take 1 tablet (2 mg total) by mouth 2 (two) times daily before a meal. 180 tablet 3  . Semaglutide (RYBELSUS) 7 MG TABS Take 7 mg by mouth daily. 90 tablet 3  . traMADol (ULTRAM) 50 MG tablet Take 1 tablet by  mouth daily.  0   No current facility-administered medications on file prior to visit.     Objective:  Objective  Physical Exam Vitals and nursing note reviewed.  Constitutional:      General: He is sleeping.     Appearance: He is well-developed.  HENT:     Head: Normocephalic and atraumatic.  Eyes:     Pupils: Pupils are equal, round, and reactive to light.  Neck:     Thyroid: No thyromegaly.  Cardiovascular:     Rate and Rhythm: Normal rate and regular rhythm.     Heart sounds: No murmur heard.   Pulmonary:     Effort: Pulmonary effort is normal. No respiratory distress.     Breath sounds: Normal breath sounds. No wheezing or rales.  Chest:     Chest wall: No tenderness.  Musculoskeletal:        General: No tenderness.     Cervical back: Normal range of motion and neck supple.  Skin:    General: Skin is warm and dry.  Neurological:     Mental Status: He is oriented to person, place, and time.  Psychiatric:        Behavior: Behavior normal.        Thought Content: Thought content normal.        Judgment: Judgment normal.    BP 120/60 (BP Location: Right Arm, Patient Position: Sitting, Cuff Size: Normal)   Pulse 100   Temp 98.2 F (36.8 C) (Oral)   Resp 18   Ht 5\' 5"  (1.651 m)   Wt 147 lb 3.2 oz (66.8 kg)   SpO2 99%   BMI 24.50 kg/m  Wt Readings from Last 3 Encounters:  12/26/19 147 lb 3.2 oz (66.8 kg)  12/10/19 149 lb (67.6 kg)  10/22/19 155 lb 9.6 oz (70.6 kg)     Lab Results  Component Value Date   WBC 4.4 12/26/2019   HGB 11.3 (L) 12/26/2019   HCT 32.8 (L) 12/26/2019   PLT 333 12/26/2019   GLUCOSE 83 12/26/2019   CHOL 121 12/26/2019   TRIG 95 12/26/2019   HDL 42 12/26/2019   LDLCALC 61 12/26/2019   ALT 7 (L) 12/26/2019   AST 12 12/26/2019   NA 139 12/26/2019   K 4.7 12/26/2019   CL 105 12/26/2019   CREATININE 1.14 (H) 12/26/2019   BUN 18 12/26/2019   CO2 28 12/26/2019   TSH 1.19 12/26/2019   PSA 1.20 12/26/2019   INR 0.95 08/31/2015    HGBA1C 5.7 (A) 12/10/2019   MICROALBUR 0.9 06/30/2015    DG Chest 2 View  Result Date: 08/31/2015 CLINICAL DATA:  Preoperative evaluation for cervical fusion EXAM: CHEST  2 VIEW COMPARISON:  01/31/2014 FINDINGS: Cardiac shadow is within normal limits. The lungs are well aerated bilaterally. No focal infiltrate or sizable effusion  is seen. No acute bony abnormality is noted. IMPRESSION: No active cardiopulmonary disease. Electronically Signed   By: Inez Catalina M.D.   On: 08/31/2015 10:04     Assessment & Plan:  Plan  I have discontinued Lissa Merlin Willemsen's Fish Oil. I am also having him start on mirtazapine. Additionally, I am having him maintain his finasteride, latanoprost, Coenzyme Q10 (CO Q 10 PO), traMADol, acetaminophen, aspirin, metFORMIN, furosemide, gabapentin, ramipril, Accu-Chek Aviva, Accu-Chek Aviva Plus, cholecalciferol, vitamin C, b complex vitamins, omeprazole, atorvastatin, repaglinide, and Rybelsus.  Meds ordered this encounter  Medications  . mirtazapine (REMERON) 15 MG tablet    Sig: Take 1 tablet (15 mg total) by mouth at bedtime.    Dispense:  30 tablet    Refill:  2    Problem List Items Addressed This Visit      Unprioritized   DM (diabetes mellitus) type II uncontrolled with eye manifestation ([place])    Per dm ii      Essential hypertension    Well controlled, no changes to meds. Encouraged heart healthy diet such as the DASH diet and exercise as tolerated.       Hyperlipidemia associated with type 2 diabetes mellitus ([place])    Encouraged heart healthy diet, increase exercise, avoid trans fats, consider a krill oil cap daily      Relevant Orders   Lipid panel (Completed)   CBC with Differential/Platelet (Completed)   Comprehensive metabolic panel (Completed)   Thyroid Panel With TSH (Completed)   Iron deficiency anemia    Recheck labs today       Relevant Orders   Fecal occult blood, imunochemical   Iron Binding Cap (TIBC)([facility])    Transferrin (Completed)   Ferritin (Completed)   Iron,Total/Total Iron Binding Cap (Completed)   Ambulatory referral to Gastroenterology    Other Visit Diagnoses    Need for influenza vaccination    -  Primary   Relevant Orders   Flu Vaccine QUAD High Dose(Fluad) (Completed)   Weight loss       Relevant Medications   mirtazapine (REMERON) 15 MG tablet   Other Relevant Orders   CBC with Differential/Platelet (Completed)   Comprehensive metabolic panel (Completed)   Thyroid Panel With TSH (Completed)   Ambulatory referral to Gastroenterology   History of prostate cancer       Relevant Orders   PSA (Completed)      Follow-up: No follow-ups on file.  Ann Held, DO

## 2019-12-27 LAB — COMPREHENSIVE METABOLIC PANEL
AG Ratio: 1.9 (calc) (ref 1.0–2.5)
ALT: 7 U/L — ABNORMAL LOW (ref 9–46)
AST: 12 U/L (ref 10–35)
Albumin: 4.1 g/dL (ref 3.6–5.1)
Alkaline phosphatase (APISO): 61 U/L (ref 35–144)
BUN/Creatinine Ratio: 16 (calc) (ref 6–22)
BUN: 18 mg/dL (ref 7–25)
CO2: 28 mmol/L (ref 20–32)
Calcium: 9.4 mg/dL (ref 8.6–10.3)
Chloride: 105 mmol/L (ref 98–110)
Creat: 1.14 mg/dL — ABNORMAL HIGH (ref 0.70–1.11)
Globulin: 2.2 g/dL (calc) (ref 1.9–3.7)
Glucose, Bld: 83 mg/dL (ref 65–99)
Potassium: 4.7 mmol/L (ref 3.5–5.3)
Sodium: 139 mmol/L (ref 135–146)
Total Bilirubin: 0.5 mg/dL (ref 0.2–1.2)
Total Protein: 6.3 g/dL (ref 6.1–8.1)

## 2019-12-27 LAB — CBC WITH DIFFERENTIAL/PLATELET
Absolute Monocytes: 550 cells/uL (ref 200–950)
Basophils Absolute: 31 cells/uL (ref 0–200)
Basophils Relative: 0.7 %
Eosinophils Absolute: 128 cells/uL (ref 15–500)
Eosinophils Relative: 2.9 %
HCT: 32.8 % — ABNORMAL LOW (ref 38.5–50.0)
Hemoglobin: 11.3 g/dL — ABNORMAL LOW (ref 13.2–17.1)
Lymphs Abs: 1109 cells/uL (ref 850–3900)
MCH: 32.8 pg (ref 27.0–33.0)
MCHC: 34.5 g/dL (ref 32.0–36.0)
MCV: 95.1 fL (ref 80.0–100.0)
MPV: 9.9 fL (ref 7.5–12.5)
Monocytes Relative: 12.5 %
Neutro Abs: 2583 cells/uL (ref 1500–7800)
Neutrophils Relative %: 58.7 %
Platelets: 333 10*3/uL (ref 140–400)
RBC: 3.45 10*6/uL — ABNORMAL LOW (ref 4.20–5.80)
RDW: 12.3 % (ref 11.0–15.0)
Total Lymphocyte: 25.2 %
WBC: 4.4 10*3/uL (ref 3.8–10.8)

## 2019-12-27 LAB — IRON, TOTAL/TOTAL IRON BINDING CAP
%SAT: 17 % (calc) — ABNORMAL LOW (ref 20–48)
Iron: 48 ug/dL — ABNORMAL LOW (ref 50–180)
TIBC: 286 mcg/dL (calc) (ref 250–425)

## 2019-12-27 LAB — LIPID PANEL
Cholesterol: 121 mg/dL (ref <200)
HDL: 42 mg/dL (ref >=40)
LDL Cholesterol (Calc): 61 mg/dL (calc)
Non-HDL Cholesterol (Calc): 79 mg/dL (calc) (ref <130)
Total CHOL/HDL Ratio: 2.9 (calc) (ref <5.0)
Triglycerides: 95 mg/dL (ref <150)

## 2019-12-27 LAB — PSA: PSA: 1.2 ng/mL (ref <=4.0)

## 2019-12-27 LAB — TRANSFERRIN: Transferrin: 215 mg/dL (ref 188–341)

## 2019-12-27 LAB — THYROID PANEL WITH TSH
Free Thyroxine Index: 3 (ref 1.4–3.8)
T3 Uptake: 29 % (ref 22–35)
T4, Total: 10.5 ug/dL (ref 4.9–10.5)
TSH: 1.19 mIU/L (ref 0.40–4.50)

## 2019-12-27 LAB — FERRITIN: Ferritin: 48 ng/mL (ref 24–380)

## 2019-12-29 ENCOUNTER — Encounter: Payer: Self-pay | Admitting: Family Medicine

## 2019-12-29 DIAGNOSIS — D509 Iron deficiency anemia, unspecified: Secondary | ICD-10-CM | POA: Insufficient documentation

## 2019-12-29 NOTE — Assessment & Plan Note (Signed)
Per dm ii

## 2019-12-29 NOTE — Assessment & Plan Note (Signed)
Well controlled, no changes to meds. Encouraged heart healthy diet such as the DASH diet and exercise as tolerated.  °

## 2019-12-29 NOTE — Assessment & Plan Note (Signed)
Encouraged heart healthy diet, increase exercise, avoid trans fats, consider a krill oil cap daily 

## 2019-12-29 NOTE — Assessment & Plan Note (Signed)
Recheck labs today. 

## 2019-12-31 ENCOUNTER — Other Ambulatory Visit (INDEPENDENT_AMBULATORY_CARE_PROVIDER_SITE_OTHER): Payer: Medicare HMO

## 2019-12-31 DIAGNOSIS — D509 Iron deficiency anemia, unspecified: Secondary | ICD-10-CM

## 2020-01-01 ENCOUNTER — Telehealth: Payer: Self-pay

## 2020-01-01 ENCOUNTER — Other Ambulatory Visit: Payer: Self-pay

## 2020-01-01 DIAGNOSIS — D619 Aplastic anemia, unspecified: Secondary | ICD-10-CM

## 2020-01-01 DIAGNOSIS — R634 Abnormal weight loss: Secondary | ICD-10-CM

## 2020-01-01 LAB — FECAL OCCULT BLOOD, IMMUNOCHEMICAL: Fecal Occult Bld: POSITIVE — AB

## 2020-01-01 NOTE — Telephone Encounter (Signed)
[place] lab called stating patient has a positive IFOB

## 2020-01-02 NOTE — Telephone Encounter (Signed)
Add positive guiac to GI referral

## 2020-01-24 ENCOUNTER — Ambulatory Visit: Payer: Medicare HMO | Admitting: Endocrinology

## 2020-02-12 ENCOUNTER — Ambulatory Visit (INDEPENDENT_AMBULATORY_CARE_PROVIDER_SITE_OTHER): Payer: Medicare HMO | Admitting: Endocrinology

## 2020-02-12 ENCOUNTER — Other Ambulatory Visit: Payer: Self-pay

## 2020-02-12 ENCOUNTER — Encounter: Payer: Self-pay | Admitting: Endocrinology

## 2020-02-12 VITALS — BP 128/78 | HR 82 | Ht 65.0 in | Wt 150.0 lb

## 2020-02-12 DIAGNOSIS — IMO0002 Reserved for concepts with insufficient information to code with codable children: Secondary | ICD-10-CM

## 2020-02-12 DIAGNOSIS — E1165 Type 2 diabetes mellitus with hyperglycemia: Secondary | ICD-10-CM | POA: Diagnosis not present

## 2020-02-12 DIAGNOSIS — E1139 Type 2 diabetes mellitus with other diabetic ophthalmic complication: Secondary | ICD-10-CM | POA: Diagnosis not present

## 2020-02-12 LAB — POCT GLYCOSYLATED HEMOGLOBIN (HGB A1C): Hemoglobin A1C: 5.4 % (ref 4.0–5.6)

## 2020-02-12 NOTE — Progress Notes (Signed)
Subjective:    Patient ID: [name] [name], male    DOB: 05-Feb-1935, 85 y.o.   MRN: [id number]  HPI Pt returns for f/u of diabetes mellitus: DM type: 2 Dx'ed: 6195 Complications: CRI, PAD, and DR Therapy: 4 oral meds DKA: never Severe hypoglycemia: never Pancreatitis: never Pancreatic imaging: normal on 2013 CT SDOH: none Other: he has never been on insulin, and he declines; renal insuff and edema limit rx options.   Interval history: no cbg record, but states cbg's vary from 75-180.  He takes meds as rx'ed.  pt states he feels well in general.  Past Medical History:  Diagnosis Date  . Diabetes mellitus 1955   type 2 - fasting 100-120  . Diverticulosis 2009  . GERD (gastroesophageal reflux disease)   . Glaucoma    bilateral  . History of shingles   . Hyperlipidemia    5 yrs  . Hypertension   . 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 Osteoarthritis    "knees" (01/31/2014)  . Prostate cancer ([place])    "no treatments; it's going away by itself w/the pills I take"    Past Surgical History:  Procedure Laterality Date  . ANTERIOR CERVICAL DECOMP/DISCECTOMY FUSION N/A 09/03/2015   Procedure: ANTERIOR CERVICAL DECOMPRESSION FUSION, CERVICAL 5-6 WITH INSTRUMENTATION AND ALLOGRAFT;  Surgeon: Phylliss Bob, MD;  Location: [facility];  Service: Orthopedics;  Laterality: N/A;  ANTERIOR CERVICAL DECOMPRESSION FUSION, CERVICAL 5-6 WITH INSTRUMENTATION AND ALLOGRAFT  . COLONOSCOPY    . FINE NEEDLE ASPIRATION Left 06/10/15   Knee  . FINE NEEDLE ASPIRATION  05/26/15   Back  . JOINT REPLACEMENT    . TONSILLECTOMY    . TOTAL KNEE ARTHROPLASTY Right 01/31/2014  . TOTAL KNEE ARTHROPLASTY Right 01/31/2014   Procedure: RIGHT TOTAL KNEE ARTHROPLASTY;  Surgeon: Kerin Salen, MD;  Location: [facility];  Service: Orthopedics;  Laterality: Right;    Social History   Socioeconomic History  . Marital status: Widowed    Spouse name: Not on file  . Number of children: Not on  file  . Years of education: Not on file  . 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 Smoking status: Former Smoker    Years: 2.00    Types: Cigars    Quit date: 04/12/1995    Years since quitting: 24.8  . Smokeless tobacco: Never Used  . Tobacco comment: chews cigars still   Vaping Use  . Vaping Use: Never used  Substance and Sexual Activity  . Alcohol use: No  . Drug use: No  . Sexual activity: Never  Other Topics Concern  . Not on file  Social History Narrative   Works out in the garden   Social Determinants of Radio broadcast assistant Strain: [place]   . Difficulty of Paying Living Expenses: Not hard at all  Food Insecurity: No Food Insecurity  . Worried About Charity fundraiser in the Last Year: Never true  . Ran Out of Food in the Last Year: Never true  Transportation Needs: No Transportation Needs  . Lack of Transportation (Medical): No  . Lack of Transportation (Non-Medical): No  Physical Activity:   . Days of Exercise per Week: Not on file  . Minutes of Exercise per Session: Not on file  Stress:   . Feeling of Stress : Not on  file  Social Connections:   . Frequency of Communication with Friends and Family: Not on file  . Frequency of Social Gatherings with Friends and Family: Not on file  . Attends Religious Services: Not on file  . Active Member of Clubs or Organizations: Not on file  . Attends Archivist Meetings: Not on file  . Marital Status: Not on file  Intimate Partner Violence:   . Fear of Current or Ex-Partner: Not on file  . Emotionally Abused: Not on file  . Physically Abused: Not on file  . Sexually Abused: Not on file    Current Outpatient Medications on File Prior to Visit  Medication Sig Dispense Refill  . acetaminophen (TYLENOL) 500 MG tablet Take 500 mg by mouth every 6 (six) hours as needed.    . Ascorbic Acid (VITAMIN C) 1000 MG tablet  Take 1,000 mg by mouth daily.    Marland Kitchen aspirin 81 MG chewable tablet Chew by mouth daily.    Marland Kitchen atorvastatin (LIPITOR) 20 MG tablet TAKE 1 TABLET BY MOUTH EVERY DAY 90 tablet 1  . b complex vitamins tablet Take 1 tablet by mouth daily.    . Blood Glucose Monitoring Suppl (ACCU-CHEK AVIVA) device Use as instructed 1 each 0  . cholecalciferol (VITAMIN D3) 25 MCG (1000 UNIT) tablet Take 1,000 Units by mouth daily.    . Coenzyme Q10 (CO Q 10 PO) Take 50 mg by mouth daily.    . finasteride (PROSCAR) 5 MG tablet Take 5 mg by mouth daily.    . furosemide (LASIX) 20 MG tablet Take 1 tablet (20 mg total) by mouth every other day. 90 tablet 3  . gabapentin (NEURONTIN) 600 MG tablet Take 1 tablet (600 mg total) by mouth 3 (three) times daily. 270 tablet 3  . glucose blood (ACCU-CHEK AVIVA PLUS) test strip 1 each by Other route daily. And lancets 1/day 100 each 12  . latanoprost (XALATAN) 0.005 % ophthalmic solution Place 1 drop into both eyes at bedtime.    . metFORMIN (GLUCOPHAGE) 1000 MG tablet TAKE 1 TABLET BY MOUTH 2 TIMES DAILY WITH A MEAL 180 tablet 3  . mirtazapine (REMERON) 15 MG tablet Take 1 tablet (15 mg total) by mouth at bedtime. 30 tablet 2  . omeprazole (PRILOSEC) 20 MG capsule TAKE 1 CAPSULE BY MOUTH EVERY DAY 90 capsule 1  . ramipril (ALTACE) 2.5 MG capsule TAKE 1 CAPSULE BY MOUTH EVERY DAY 90 capsule 3  . repaglinide (PRANDIN) 2 MG tablet Take 1 tablet (2 mg total) by mouth 2 (two) times daily before a meal. 180 tablet 3  . Semaglutide (RYBELSUS) 7 MG TABS Take 7 mg by mouth daily. 90 tablet 3  . traMADol (ULTRAM) 50 MG tablet Take 1 tablet by mouth daily.  0   No current facility-administered medications on file prior to visit.    No Known Allergies  Family History  Problem Relation Age of Onset  . Arthritis Other   . Diabetes Other   . Cancer Other        prostate  . Heart disease Brother        questionable  . Colon cancer Daughter 33  . Diabetes Mother   . Colon polyps Neg Hx    . Esophageal cancer Neg Hx   . Rectal cancer Neg Hx   . Stomach cancer Neg Hx     BP 128/78   Pulse 82   Ht 5\' 5"  (1.651 m)   Wt 150 lb (68  kg)   SpO2 92%   BMI 24.96 kg/m    Review of Systems Denies nausea    Objective:   Physical Exam VITAL SIGNS:  See vs page GENERAL: no distress Pulses: dorsalis pedis intact bilat.   MSK: no deformity of the feet, except for overlapping toes.   CV: no leg edema Skin:  no ulcer on the feet.  normal color and temp on the feet. Neuro: sensation is intact to touch on the feet.    Lab Results  Component Value Date   HGBA1C 5.4 02/12/2020   Lab Results  Component Value Date   CREATININE 1.14 (H) 12/26/2019   BUN 18 12/26/2019   NA 139 12/26/2019   K 4.7 12/26/2019   CL 105 12/26/2019   CO2 28 12/26/2019       Assessment & Plan:  Type 2 DM: well-controlled   Patient Instructions  Please stop taking the nateglinide, and continue the same other 3 diabetes medications. check your blood sugar once a day.  vary the time of day when you check, between before the 3 meals, and at bedtime.  also check if you have symptoms of your blood sugar being too high or too low.  please keep a record of the readings and bring it to your next appointment here (or you can bring the meter itself).  You can write it on any piece of paper.  please call us sooner if your blood sugar goes below 70, or if you have a lot of readings over 200. Please come back for a follow-up appointment in 4 months.

## 2020-02-12 NOTE — Patient Instructions (Addendum)
Please stop taking the nateglinide, and continue the same other 3 diabetes medications. check your blood sugar once a day.  vary the time of day when you check, between before the 3 meals, and at bedtime.  also check if you have symptoms of your blood sugar being too high or too low.  please keep a record of the readings and bring it to your next appointment here (or you can bring the meter itself).  You can write it on any piece of paper.  please call us sooner if your blood sugar goes below 70, or if you have a lot of readings over 200. Please come back for a follow-up appointment in 4 months.

## 2020-02-20 ENCOUNTER — Other Ambulatory Visit: Payer: Self-pay | Admitting: Family Medicine

## 2020-02-20 DIAGNOSIS — R634 Abnormal weight loss: Secondary | ICD-10-CM

## 2020-04-20 ENCOUNTER — Other Ambulatory Visit: Payer: Self-pay

## 2020-04-20 ENCOUNTER — Ambulatory Visit (INDEPENDENT_AMBULATORY_CARE_PROVIDER_SITE_OTHER): Payer: Medicare HMO | Admitting: Endocrinology

## 2020-04-20 ENCOUNTER — Encounter: Payer: Self-pay | Admitting: Endocrinology

## 2020-04-20 VITALS — BP 122/88 | HR 83 | Ht 65.0 in | Wt 153.0 lb

## 2020-04-20 DIAGNOSIS — E1139 Type 2 diabetes mellitus with other diabetic ophthalmic complication: Secondary | ICD-10-CM | POA: Diagnosis not present

## 2020-04-20 DIAGNOSIS — E1165 Type 2 diabetes mellitus with hyperglycemia: Secondary | ICD-10-CM

## 2020-04-20 DIAGNOSIS — E1151 Type 2 diabetes mellitus with diabetic peripheral angiopathy without gangrene: Secondary | ICD-10-CM | POA: Diagnosis not present

## 2020-04-20 DIAGNOSIS — IMO0002 Reserved for concepts with insufficient information to code with codable children: Secondary | ICD-10-CM

## 2020-04-20 LAB — POCT GLYCOSYLATED HEMOGLOBIN (HGB A1C): Hemoglobin A1C: 5.6 % (ref 4.0–5.6)

## 2020-04-20 MED ORDER — REPAGLINIDE 1 MG PO TABS: 1.0000 mg | ORAL_TABLET | Freq: Two times a day (BID) | ORAL | 3 refills | Status: DC

## 2020-04-20 NOTE — Patient Instructions (Addendum)
Please stay off the Rybelsus. I have sent a prescription to your pharmacy, to reduce the repaglinide to 1 mg twice a day (with meals) Please continue the same metformin.   check your blood sugar once a day.  vary the time of day when you check, between before the 3 meals, and at bedtime.  also check if you have symptoms of your blood sugar being too high or too low.  please keep a record of the readings and bring it to your next appointment here (or you can bring the meter itself).  You can write it on any piece of paper.  please call us sooner if your blood sugar goes below 70, or if you have a lot of readings over 200. Please come back for a follow-up appointment in 3 months.

## 2020-04-20 NOTE — Progress Notes (Signed)
Subjective:    Patient ID: [name] [name], male    DOB: [birth year], 86 y.o.   MRN: [id number]  HPI Pt returns for f/u of diabetes mellitus: DM type: 2 Dx'ed: 0160 Complications: CRI, PAD, and DR Therapy: 3 oral meds DKA: never Severe hypoglycemia: never Pancreatitis: never Pancreatic imaging: normal on 2013 CT SDOH: none Other: he has never been on insulin, and he declines; renal insuff and edema limit rx options.   Interval history: no cbg record, but states cbg's vary from 79-160.  pt states he feels well in general.  He stopped Rybelsus 1 month ago, due to cost.   Past Medical History:  Diagnosis Date  . Diabetes mellitus 1955   type 2 - fasting 100-120  . Diverticulosis 2009  . GERD (gastroesophageal reflux disease)   . Glaucoma    bilateral  . History of shingles   . Hyperlipidemia    5 yrs  . Hypertension   . 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 Osteoarthritis    "knees" (01/31/2014)  . Prostate cancer ([place])    "no treatments; it's going away by itself w/the pills I take"    Past Surgical History:  Procedure Laterality Date  . ANTERIOR CERVICAL DECOMP/DISCECTOMY FUSION N/A 09/03/2015   Procedure: ANTERIOR CERVICAL DECOMPRESSION FUSION, CERVICAL 5-6 WITH INSTRUMENTATION AND ALLOGRAFT;  Surgeon: Phylliss Bob, MD;  Location: [facility];  Service: Orthopedics;  Laterality: N/A;  ANTERIOR CERVICAL DECOMPRESSION FUSION, CERVICAL 5-6 WITH INSTRUMENTATION AND ALLOGRAFT  . COLONOSCOPY    . FINE NEEDLE ASPIRATION Left 06/10/15   Knee  . FINE NEEDLE ASPIRATION  05/26/15   Back  . JOINT REPLACEMENT    . TONSILLECTOMY    . TOTAL KNEE ARTHROPLASTY Right 01/31/2014  . TOTAL KNEE ARTHROPLASTY Right 01/31/2014   Procedure: RIGHT TOTAL KNEE ARTHROPLASTY;  Surgeon: Kerin Salen, MD;  Location: [facility];  Service: Orthopedics;  Laterality: Right;    Social History   Socioeconomic History  . Marital status: Widowed    Spouse name: Not on file  .  Number of children: Not on file  . Years of education: Not on file  . 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 Smoking status: Former Smoker    Years: 2.00    Types: Cigars    Quit date: 04/12/1995    Years since quitting: 25.0  . Smokeless tobacco: Never Used  . Tobacco comment: chews cigars still   Vaping Use  . Vaping Use: Never used  Substance and Sexual Activity  . Alcohol use: No  . Drug use: No  . Sexual activity: Never  Other Topics Concern  . Not on file  Social History Narrative   Works out in the garden   Social Determinants of Radio broadcast assistant Strain: [place]   . Difficulty of Paying Living Expenses: Not hard at all  Food Insecurity: No Food Insecurity  . Worried About Charity fundraiser in the Last Year: Never true  . Ran Out of Food in the Last Year: Never true  Transportation Needs: No Transportation Needs  . Lack of Transportation (Medical): No  . Lack of Transportation (Non-Medical): No  Physical Activity: Not on file  Stress: Not on file  Social Connections: Not on file  Intimate Partner Violence: Not on file    Current Outpatient Medications  on File Prior to Visit  Medication Sig Dispense Refill  . acetaminophen (TYLENOL) 500 MG tablet Take 500 mg by mouth every 6 (six) hours as needed.    . Ascorbic Acid (VITAMIN C) 1000 MG tablet Take 1,000 mg by mouth daily.    Marland Kitchen aspirin 81 MG chewable tablet Chew by mouth daily.    Marland Kitchen atorvastatin (LIPITOR) 20 MG tablet TAKE 1 TABLET BY MOUTH EVERY DAY 90 tablet 1  . b complex vitamins tablet Take 1 tablet by mouth daily.    . Blood Glucose Monitoring Suppl (ACCU-CHEK AVIVA) device Use as instructed 1 each 0  . cholecalciferol (VITAMIN D3) 25 MCG (1000 UNIT) tablet Take 1,000 Units by mouth daily.    . Coenzyme Q10 (CO Q 10 PO) Take 50 mg by mouth daily.    . finasteride (PROSCAR) 5 MG tablet Take 5 mg by mouth  daily.    . furosemide (LASIX) 20 MG tablet Take 1 tablet (20 mg total) by mouth every other day. 90 tablet 3  . gabapentin (NEURONTIN) 600 MG tablet Take 1 tablet (600 mg total) by mouth 3 (three) times daily. 270 tablet 3  . glucose blood (ACCU-CHEK AVIVA PLUS) test strip 1 each by Other route daily. And lancets 1/day 100 each 12  . latanoprost (XALATAN) 0.005 % ophthalmic solution Place 1 drop into both eyes at bedtime.    . metFORMIN (GLUCOPHAGE) 1000 MG tablet TAKE 1 TABLET BY MOUTH 2 TIMES DAILY WITH A MEAL 180 tablet 3  . mirtazapine (REMERON) 15 MG tablet TAKE 1 TABLET BY MOUTH EVERYDAY AT BEDTIME 90 tablet 1  . omeprazole (PRILOSEC) 20 MG capsule TAKE 1 CAPSULE BY MOUTH EVERY DAY 90 capsule 1  . ramipril (ALTACE) 2.5 MG capsule TAKE 1 CAPSULE BY MOUTH EVERY DAY 90 capsule 3  . traMADol (ULTRAM) 50 MG tablet Take 1 tablet by mouth daily.  0   No current facility-administered medications on file prior to visit.    No Known Allergies  Family History  Problem Relation Age of Onset  . Arthritis Other   . Diabetes Other   . Cancer Other        prostate  . Heart disease Brother        questionable  . Colon cancer Daughter 29  . Diabetes Mother   . Colon polyps Neg Hx   . Esophageal cancer Neg Hx   . Rectal cancer Neg Hx   . Stomach cancer Neg Hx     BP 122/88   Pulse 83   Ht 5\' 5"  (1.651 m)   Wt 153 lb (69.4 kg)   SpO2 98%   BMI 25.46 kg/m    Review of Systems     Objective:   Physical Exam VITAL SIGNS:  See vs page GENERAL: no distress Pulses: dorsalis pedis intact bilat.   MSK: no deformity of the feet CV: 2+ bilat leg edema Skin:  no ulcer on the feet.  normal color and temp on the feet. Neuro: sensation is intact to touch on the feet.     Lab Results  Component Value Date   HGBA1C 5.6 04/20/2020   Lab Results  Component Value Date   CREATININE 1.14 (H) 12/26/2019   BUN 18 12/26/2019   NA 139 12/26/2019   K 4.7 12/26/2019   CL 105 12/26/2019    CO2 28 12/26/2019      Assessment & Plan:  Type 2 DM, with PAD: overcontrolled.    Patient Instructions  Please stay off the Rybelsus. I have sent a prescription to your pharmacy, to reduce the repaglinide to 1 mg twice a day (with meals) Please continue the same metformin.   check your blood sugar once a day.  vary the time of day when you check, between before the 3 meals, and at bedtime.  also check if you have symptoms of your blood sugar being too high or too low.  please keep a record of the readings and bring it to your next appointment here (or you can bring the meter itself).  You can write it on any piece of paper.  please call us sooner if your blood sugar goes below 70, or if you have a lot of readings over 200. Please come back for a follow-up appointment in 3 months.

## 2020-04-23 DIAGNOSIS — H2513 Age-related nuclear cataract, bilateral: Secondary | ICD-10-CM | POA: Diagnosis not present

## 2020-04-23 DIAGNOSIS — H401131 Primary open-angle glaucoma, bilateral, mild stage: Secondary | ICD-10-CM | POA: Diagnosis not present

## 2020-04-23 DIAGNOSIS — E119 Type 2 diabetes mellitus without complications: Secondary | ICD-10-CM | POA: Diagnosis not present

## 2020-06-04 ENCOUNTER — Other Ambulatory Visit: Payer: Self-pay | Admitting: Family Medicine

## 2020-06-04 DIAGNOSIS — E1165 Type 2 diabetes mellitus with hyperglycemia: Secondary | ICD-10-CM

## 2020-06-04 DIAGNOSIS — E1151 Type 2 diabetes mellitus with diabetic peripheral angiopathy without gangrene: Secondary | ICD-10-CM

## 2020-06-04 DIAGNOSIS — I10 Essential (primary) hypertension: Secondary | ICD-10-CM

## 2020-06-04 DIAGNOSIS — IMO0002 Reserved for concepts with insufficient information to code with codable children: Secondary | ICD-10-CM

## 2020-06-04 DIAGNOSIS — E1149 Type 2 diabetes mellitus with other diabetic neurological complication: Secondary | ICD-10-CM

## 2020-06-11 ENCOUNTER — Ambulatory Visit: Payer: Medicare HMO | Admitting: Endocrinology

## 2020-06-21 NOTE — Telephone Encounter (Signed)
Metformin and glipizide.  Please refill PRN Others: Please forward refill request to pt's primary care provider.

## 2020-07-21 ENCOUNTER — Other Ambulatory Visit: Payer: Self-pay

## 2020-07-21 ENCOUNTER — Ambulatory Visit (INDEPENDENT_AMBULATORY_CARE_PROVIDER_SITE_OTHER): Payer: Medicare HMO | Admitting: Endocrinology

## 2020-07-21 VITALS — BP 140/70 | HR 74 | Ht 65.0 in | Wt 165.6 lb

## 2020-07-21 DIAGNOSIS — E1165 Type 2 diabetes mellitus with hyperglycemia: Secondary | ICD-10-CM

## 2020-07-21 DIAGNOSIS — E1139 Type 2 diabetes mellitus with other diabetic ophthalmic complication: Secondary | ICD-10-CM | POA: Diagnosis not present

## 2020-07-21 DIAGNOSIS — IMO0002 Reserved for concepts with insufficient information to code with codable children: Secondary | ICD-10-CM

## 2020-07-21 LAB — POCT GLYCOSYLATED HEMOGLOBIN (HGB A1C): Hemoglobin A1C: 6.3 % — AB (ref 4.0–5.6)

## 2020-07-21 MED ORDER — REPAGLINIDE 1 MG PO TABS: 1.0000 mg | ORAL_TABLET | Freq: Every day | ORAL | 3 refills | Status: DC

## 2020-07-21 NOTE — Progress Notes (Signed)
Subjective:    Patient ID: [name] [name], male    DOB: 10-Jan-1935, 86 y.o.   MRN: [id number]  HPI Pt returns for f/u of diabetes mellitus:  DM type: 2 Dx'ed: 4132 Complications: CRI, PAD, and DR Therapy: 2 oral meds DKA: never Severe hypoglycemia: never Pancreatitis: never Pancreatic imaging: normal on 2013 CT SDOH: he stopped Rybelsus, due to cost.  Other: he has never been on insulin, and he declines; renal insuff and edema limit rx options.   Interval history: no cbg record, but states cbg's vary from 66-80.  pt states he feels well in general.  He takes meds as rx'ed (but he does not take glipizide).   Past Medical History:  Diagnosis Date  . Diabetes mellitus 1955   type 2 - fasting 100-120  . Diverticulosis 2009  . GERD (gastroesophageal reflux disease)   . Glaucoma    bilateral  . History of shingles   . Hyperlipidemia    5 yrs  . Hypertension   . 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 Osteoarthritis    "knees" (01/31/2014)  . Prostate cancer ([place])    "no treatments; it's going away by itself w/the pills I take"    Past Surgical History:  Procedure Laterality Date  . ANTERIOR CERVICAL DECOMP/DISCECTOMY FUSION N/A 09/03/2015   Procedure: ANTERIOR CERVICAL DECOMPRESSION FUSION, CERVICAL 5-6 WITH INSTRUMENTATION AND ALLOGRAFT;  Surgeon: Phylliss Bob, MD;  Location: [facility];  Service: Orthopedics;  Laterality: N/A;  ANTERIOR CERVICAL DECOMPRESSION FUSION, CERVICAL 5-6 WITH INSTRUMENTATION AND ALLOGRAFT  . COLONOSCOPY    . FINE NEEDLE ASPIRATION Left 06/10/15   Knee  . FINE NEEDLE ASPIRATION  05/26/15   Back  . JOINT REPLACEMENT    . TONSILLECTOMY    . TOTAL KNEE ARTHROPLASTY Right 01/31/2014  . TOTAL KNEE ARTHROPLASTY Right 01/31/2014   Procedure: RIGHT TOTAL KNEE ARTHROPLASTY;  Surgeon: Kerin Salen, MD;  Location: [facility];  Service: Orthopedics;  Laterality: Right;    Social History   Socioeconomic History  . Marital status:  Widowed    Spouse name: Not on file  . Number of children: Not on file  . Years of education: Not on file  . 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 Smoking status: Former Smoker    Years: 2.00    Types: Cigars    Quit date: 04/12/1995    Years since quitting: 25.3  . Smokeless tobacco: Never Used  . Tobacco comment: chews cigars still   Vaping Use  . Vaping Use: Never used  Substance and Sexual Activity  . Alcohol use: No  . Drug use: No  . Sexual activity: Never  Other Topics Concern  . Not on file  Social History Narrative   Works out in the garden   Social Determinants of Radio broadcast assistant Strain: [place]   . Difficulty of Paying Living Expenses: Not hard at all  Food Insecurity: No Food Insecurity  . Worried About Charity fundraiser in the Last Year: Never true  . Ran Out of Food in the Last Year: Never true  Transportation Needs: No Transportation Needs  . Lack of Transportation (Medical): No  . Lack of Transportation (Non-Medical): No  Physical Activity: Not on file  Stress: Not on file  Social Connections: Not on file  Intimate Partner Violence:  Not on file    Current Outpatient Medications on File Prior to Visit  Medication Sig Dispense Refill  . acetaminophen (TYLENOL) 500 MG tablet Take 500 mg by mouth every 6 (six) hours as needed.    . Ascorbic Acid (VITAMIN C) 1000 MG tablet Take 1,000 mg by mouth daily.    Marland Kitchen aspirin 81 MG chewable tablet Chew by mouth daily.    Marland Kitchen atorvastatin (LIPITOR) 20 MG tablet Take 1 tablet (20 mg total) by mouth daily. 90 tablet 1  . b complex vitamins tablet Take 1 tablet by mouth daily.    . cholecalciferol (VITAMIN D3) 25 MCG (1000 UNIT) tablet Take 1,000 Units by mouth daily.    . Coenzyme Q10 (CO Q 10 PO) Take 50 mg by mouth daily.    . finasteride (PROSCAR) 5 MG tablet Take 5 mg by mouth daily.    . furosemide (LASIX) 20 MG  tablet Take 1 tablet (20 mg total) by mouth every other day. 45 tablet 1  . gabapentin (NEURONTIN) 600 MG tablet Take 1 tablet (600 mg total) by mouth 3 (three) times daily. 270 tablet 1  . glucose blood (ACCU-CHEK AVIVA PLUS) test strip 1 each by Other route daily. And lancets 1/day 100 each 12  . latanoprost (XALATAN) 0.005 % ophthalmic solution Place 1 drop into both eyes at bedtime.    . metFORMIN (GLUCOPHAGE) 1000 MG tablet TAKE 1 TABLET TWICE DAILY WITH MEALS 180 tablet 3  . mirtazapine (REMERON) 15 MG tablet TAKE 1 TABLET BY MOUTH EVERYDAY AT BEDTIME 90 tablet 1  . omeprazole (PRILOSEC) 20 MG capsule Take 1 capsule (20 mg total) by mouth daily. 90 capsule 3  . ramipril (ALTACE) 2.5 MG capsule Take 1 capsule (2.5 mg total) by mouth daily. 90 capsule 1  . traMADol (ULTRAM) 50 MG tablet Take 1 tablet by mouth daily.  0   No current facility-administered medications on file prior to visit.    No Known Allergies  Family History  Problem Relation Age of Onset  . Arthritis Other   . Diabetes Other   . Cancer Other        prostate  . Heart disease Brother        questionable  . Colon cancer Daughter 8  . Diabetes Mother   . Colon polyps Neg Hx   . Esophageal cancer Neg Hx   . Rectal cancer Neg Hx   . Stomach cancer Neg Hx     BP 140/70 (BP Location: Right Arm, Patient Position: Sitting, Cuff Size: Normal)   Pulse 74   Ht 5\' 5"  (1.651 m)   Wt 165 lb 9.6 oz (75.1 kg)   SpO2 94%   BMI 27.56 kg/m    Review of Systems     Objective:   Physical Exam VITAL SIGNS:  See vs page GENERAL: no distress Pulses: dorsalis pedis intact bilat.   MSK: no deformity of the feet CV: no leg edema Skin:  no ulcer on the feet.  normal color and temp on the feet.   Neuro: sensation is intact to touch on the feet.   Ext: there is bilateral onychomycosis of the toenails.    A1c=6.3%  Lab Results  Component Value Date   CREATININE 1.14 (H) 12/26/2019   BUN 18 12/26/2019   NA 139  12/26/2019   K 4.7 12/26/2019   CL 105 12/26/2019   CO2 28 12/26/2019      Assessment & Plan:  Type 2 DM: overcontrolled.  We discussed glipizide.  That refill was an error.  Pt says he is not taking it.   Patient Instructions  I have sent a prescription to your pharmacy, to reduce the repaglinide to 1 mg once a day (with breakfast) Please continue the same metformin.   check your blood sugar once a day.  vary the time of day when you check, between before the 3 meals, and at bedtime.  also check if you have symptoms of your blood sugar being too high or too low.  please keep a record of the readings and bring it to your next appointment here (or you can bring the meter itself).  You can write it on any piece of paper.  please call us sooner if your blood sugar goes below 70, or if you have a lot of readings over 200.  Please come back for a follow-up appointment in 3 months.

## 2020-07-21 NOTE — Patient Instructions (Addendum)
I have sent a prescription to your pharmacy, to reduce the repaglinide to 1 mg once a day (with breakfast) Please continue the same metformin.   check your blood sugar once a day.  vary the time of day when you check, between before the 3 meals, and at bedtime.  also check if you have symptoms of your blood sugar being too high or too low.  please keep a record of the readings and bring it to your next appointment here (or you can bring the meter itself).  You can write it on any piece of paper.  please call us sooner if your blood sugar goes below 70, or if you have a lot of readings over 200.  Please come back for a follow-up appointment in 3 months.

## 2020-07-31 ENCOUNTER — Telehealth: Payer: Self-pay | Admitting: Family Medicine

## 2020-07-31 NOTE — Telephone Encounter (Signed)
Caller Lattie Haw from [employer] # [phone]   pt need a prescription for supplies  True Matrix Glucose test strip TruePlus Lancet 33 [address]

## 2020-08-04 MED ORDER — TRUEPLUS LANCETS 33G MISC: 3 refills | Status: DC

## 2020-08-04 MED ORDER — TRUE METRIX BLOOD GLUCOSE TEST VI STRP: ORAL_STRIP | 12 refills | Status: DC

## 2020-08-04 NOTE — Telephone Encounter (Signed)
Refilled to mail delivery.

## 2020-08-11 ENCOUNTER — Other Ambulatory Visit: Payer: Self-pay

## 2020-08-11 MED ORDER — TRUE METRIX BLOOD GLUCOSE TEST VI STRP: ORAL_STRIP | 12 refills | Status: DC

## 2020-08-11 MED ORDER — TRUEPLUS LANCETS 33G MISC: 3 refills | Status: DC

## 2020-08-11 NOTE — Telephone Encounter (Signed)
Refill sent.

## 2020-08-11 NOTE — Telephone Encounter (Signed)
Per pharmacy they have not received the true metrix glucose meter kit , please send   [place], [place] Phone:  [phone]  Fax:  [phone]

## 2020-08-25 DIAGNOSIS — H2513 Age-related nuclear cataract, bilateral: Secondary | ICD-10-CM | POA: Diagnosis not present

## 2020-08-25 DIAGNOSIS — E119 Type 2 diabetes mellitus without complications: Secondary | ICD-10-CM | POA: Diagnosis not present

## 2020-08-25 DIAGNOSIS — H401131 Primary open-angle glaucoma, bilateral, mild stage: Secondary | ICD-10-CM | POA: Diagnosis not present

## 2020-08-27 DIAGNOSIS — R351 Nocturia: Secondary | ICD-10-CM | POA: Diagnosis not present

## 2020-08-27 DIAGNOSIS — C61 Malignant neoplasm of prostate: Secondary | ICD-10-CM | POA: Diagnosis not present

## 2020-08-27 DIAGNOSIS — N401 Enlarged prostate with lower urinary tract symptoms: Secondary | ICD-10-CM | POA: Diagnosis not present

## 2020-10-07 ENCOUNTER — Telehealth: Payer: Self-pay | Admitting: Family Medicine

## 2020-10-07 NOTE — Telephone Encounter (Signed)
Copied from [place] [phone]. Topic: Medicare AWV >> Oct 07, 2020 11:01 AM Harris-Coley, Hannah Beat wrote: Reason for CRM: Left message for patient to schedule Annual Wellness Visit.  Please schedule with Health Nurse Advisor Augustine Radar. at [facility].

## 2020-10-16 NOTE — Progress Notes (Signed)
Subjective:   [name] [name] is a 86 y.o. male who presents for Medicare Annual/Subsequent preventive examination.  Review of Systems     Cardiac Risk Factors include: advanced age (>37men, >54 women);diabetes mellitus;dyslipidemia;hypertension;male gender     Objective:    Today's Vitals   10/19/20 1025  BP: 130/68  Pulse: 79  Resp: 16  Temp: (!) 97.5 F (36.4 C)  TempSrc: Temporal  SpO2: 99%  Weight: 162 lb 12.8 oz (73.8 kg)  Height: 5\' 5"  (1.651 m)   Body mass index is 27.09 kg/m.  Advanced Directives 10/19/2020 09/02/2019 06/27/2019 06/18/2018 06/13/2016 09/03/2015 08/31/2015  Does Patient Have a Medical Advance Directive? Yes Yes Yes Yes Yes Yes Yes  Type of Paramedic of [place];Living will Living will - Living will;Healthcare Power of [place];Living will [place];Living will [place];Living will  Does patient want to make changes to medical advance directive? - No - Patient declined - - No - Patient declined No - Patient declined No - Patient declined  Copy of [place] in Chart? No - copy requested - - - No - copy requested No - copy requested No - copy requested  Would patient like information on creating a medical advance directive? - - - - - - -    Current Medications (verified) Outpatient Encounter Medications as of 10/19/2020  Medication Sig   acetaminophen (TYLENOL) 500 MG tablet Take 500 mg by mouth every 6 (six) hours as needed.   Ascorbic Acid (VITAMIN C) 1000 MG tablet Take 1,000 mg by mouth daily.   aspirin 81 MG chewable tablet Chew by mouth daily.   atorvastatin (LIPITOR) 20 MG tablet Take 1 tablet (20 mg total) by mouth daily.   b complex vitamins tablet Take 1 tablet by mouth daily.   cholecalciferol (VITAMIN D3) 25 MCG (1000 UNIT) tablet Take 1,000 Units by mouth daily.   Coenzyme Q10 (CO Q 10 PO) Take 50 mg by mouth daily.   finasteride (PROSCAR)  5 MG tablet Take 5 mg by mouth daily.   furosemide (LASIX) 20 MG tablet Take 1 tablet (20 mg total) by mouth every other day.   gabapentin (NEURONTIN) 600 MG tablet Take 1 tablet (600 mg total) by mouth 3 (three) times daily.   glucose blood (ACCU-CHEK AVIVA PLUS) test strip 1 each by Other route daily. And lancets 1/day   glucose blood (TRUE METRIX BLOOD GLUCOSE TEST) test strip Use as instructed   latanoprost (XALATAN) 0.005 % ophthalmic solution Place 1 drop into both eyes at bedtime.   metFORMIN (GLUCOPHAGE) 1000 MG tablet Take 1 tablet (1,000 mg total) by mouth daily with breakfast.   mirtazapine (REMERON) 15 MG tablet TAKE 1 TABLET BY MOUTH EVERYDAY AT BEDTIME   omeprazole (PRILOSEC) 20 MG capsule Take 1 capsule (20 mg total) by mouth daily.   ramipril (ALTACE) 2.5 MG capsule Take 1 capsule (2.5 mg total) by mouth daily.   repaglinide (PRANDIN) 1 MG tablet Take 1 tablet (1 mg total) by mouth daily with breakfast.   traMADol (ULTRAM) 50 MG tablet Take 1 tablet by mouth daily.   TRUEplus Lancets 33G MISC Use as directed to check blood sugar up to two times a day.   [DISCONTINUED] metFORMIN (GLUCOPHAGE) 1000 MG tablet TAKE 1 TABLET TWICE DAILY WITH MEALS   No facility-administered encounter medications on file as of 10/19/2020.    Allergies (verified) Patient has no known allergies.   History: Past  Medical History:  Diagnosis Date   Diabetes mellitus 1955   type 2 - fasting 100-120   Diverticulosis 2009   GERD (gastroesophageal reflux disease)    Glaucoma    bilateral   History of shingles    Hyperlipidemia    5 yrs   Hypertension    Neuromuscular disorder ([place])    due to shingles around abdomen area    Osteoarthritis    "knees" (01/31/2014)   Prostate cancer ([place])    "no treatments; it's going away by itself w/the pills I take"   Past Surgical History:  Procedure Laterality Date   ANTERIOR CERVICAL DECOMP/DISCECTOMY FUSION N/A 09/03/2015   Procedure: ANTERIOR CERVICAL  DECOMPRESSION FUSION, CERVICAL 5-6 WITH INSTRUMENTATION AND ALLOGRAFT;  Surgeon: Phylliss Bob, MD;  Location: [facility];  Service: Orthopedics;  Laterality: N/A;  ANTERIOR CERVICAL DECOMPRESSION FUSION, CERVICAL 5-6 WITH INSTRUMENTATION AND ALLOGRAFT   COLONOSCOPY     FINE NEEDLE ASPIRATION Left 06/10/15   Knee   FINE NEEDLE ASPIRATION  05/26/15   Back   JOINT REPLACEMENT     TONSILLECTOMY     TOTAL KNEE ARTHROPLASTY Right 01/31/2014   TOTAL KNEE ARTHROPLASTY Right 01/31/2014   Procedure: RIGHT TOTAL KNEE ARTHROPLASTY;  Surgeon: Kerin Salen, MD;  Location: [facility];  Service: Orthopedics;  Laterality: Right;   Family History  Problem Relation Age of Onset   Arthritis Other    Diabetes Other    Cancer Other        prostate   Heart disease Brother        questionable   Colon cancer Daughter 28   Diabetes Mother    Colon polyps Neg Hx    Esophageal cancer Neg Hx    Rectal cancer Neg Hx    Stomach cancer Neg Hx    Social History   Socioeconomic History   Marital status: Widowed    Spouse name: Not on file   Number of children: Not on file   Years of education: Not on file   Highest education level: Not on file  Occupational History    Comment: retired Biomedical scientist     Comment: Restaurant manager, fast food  Tobacco Use   Smoking status: Former    Pack years: 0.00    Types: Cigars    Quit date: 04/12/1995    Years since quitting: 25.5   Smokeless tobacco: Never   Tobacco comments:    chews cigars still   Vaping Use   Vaping Use: Never used  Substance and Sexual Activity   Alcohol use: No   Drug use: No   Sexual activity: Never  Other Topics Concern   Not on file  Social History Narrative   Works out in the garden   Social Determinants of Radio broadcast assistant Strain: Low Risk    Difficulty of Paying Living Expenses: Not hard at all  Food Insecurity: No Food Insecurity   Worried About Charity fundraiser in the Last Year: Never true   Arboriculturist in the Last Year: Never true   Transportation Needs: No Transportation Needs   Lack of Transportation (Medical): No   Lack of Transportation (Non-Medical): No  Physical Activity: Inactive   Days of Exercise per Week: 0 days   Minutes of Exercise per Session: 0 min  Stress: No Stress Concern Present   Feeling of Stress : Not at all  Social Connections: Moderately Isolated   Frequency of Communication with Friends and Family: More than three times  a week   Frequency of Social Gatherings with Friends and Family: More than three times a week   Attends Religious Services: More than 4 times per year   Active Member of Clubs or Organizations: No   Attends Archivist Meetings: Never   Marital Status: Widowed    Tobacco Counseling Counseling given: Not Answered Tobacco comments: chews cigars still    Clinical Intake:  Pre-visit preparation completed: Yes  Pain : No/denies pain     Nutritional Status: BMI 25 -29 Overweight Nutritional Risks: None Diabetes: Yes CBG done?: No Did pt. bring in CBG monitor from home?: No  How often do you need to have someone help you when you read instructions, pamphlets, or other written materials from your doctor or pharmacy?: 1 - Never  Diabetes:  Is the patient diabetic?  Yes  If diabetic, was a CBG obtained today?  No  Did the patient bring in their glucometer from home?  No  How often do you monitor your CBG's? daily.   Financial Strains and Diabetes Management:  Are you having any financial strains with the device, your supplies or your medication? No .  Does the patient want to be seen by Chronic Care Management for management of their diabetes?  No  Would the patient like to be referred to a Nutritionist or for Diabetic Management?  No   Diabetic Exams:  Diabetic Eye Exam: Completed 10/28/2019.   Diabetic Foot Exam: Completed 07/21/2020.    Interpreter Needed?: No  Information entered by :: Caroleen Hamman LPN   Activities of Daily Living In  your present state of health, do you have any difficulty performing the following activities: 10/19/2020  Hearing? Y  Comment hearing aids  Vision? N  Difficulty concentrating or making decisions? N  Walking or climbing stairs? N  Dressing or bathing? N  Doing errands, shopping? N  Preparing Food and eating ? N  Using the Toilet? N  In the past six months, have you accidently leaked urine? N  Do you have problems with loss of bowel control? N  Managing your Medications? N  Managing your Finances? N  Housekeeping or managing your Housekeeping? N  Some recent data might be hidden    Patient Care Team: Carollee Herter, Alferd Apa, DO as PCP - General (Family Medicine) Festus Aloe, MD as Consulting Physician (Urology) Marylynn Pearson, MD as Consulting Physician (Ophthalmology) Frederik Pear, MD as Consulting Physician (Orthopedic Surgery) Neldon Newport as Physician Assistant (Orthopedic Surgery) Normajean Glasgow, MD as Attending Physician (Physical Medicine and Rehabilitation) Phylliss Bob, MD as Consulting Physician (Orthopedic Surgery) Renato Shin, MD as Consulting Physician (Endocrinology)  Indicate any recent Medical Services you may have received from other than [facility] providers in the past year (date may be approximate).     Assessment:   This is a routine wellness examination for [employer].  Hearing/Vision screen Hearing Screening - Comments:: Bilateral hearing aids Vision Screening - Comments:: Last eye exam-10/27/20-  Dietary issues and exercise activities discussed: Current Exercise Habits: The patient does not participate in regular exercise at present (pt states he does yard work & gardening), Exercise limited by: None identified   Goals Addressed             This Visit's Progress    Maintain current health   On track      Depression Screen PHQ 2/9 Scores 10/19/2020 09/02/2019 06/27/2019 05/31/2018 12/26/2016 06/23/2015 05/01/2014  PHQ - 2 Score 0 0 0 0 0 0 0  PHQ-  9 Score - - - 0 - - -    Fall Risk Fall Risk  10/19/2020 09/02/2019 06/27/2019 02/27/2018 12/26/2016  Falls in the past year? 0 0 0 0 No  Comment - - - Emmi Telephone Survey: data to providers prior to load -  Number falls in past yr: 0 0 - - -  Injury with Fall? 0 0 - - -  Follow up Falls prevention discussed Education provided;Falls prevention discussed - - -    FALL RISK PREVENTION PERTAINING TO THE HOME:  Any stairs in or around the home? Yes  If so, are there any without handrails? No  Home free of loose throw rugs in walkways, pet beds, electrical cords, etc? Yes  Adequate lighting in your home to reduce risk of falls? Yes   ASSISTIVE DEVICES UTILIZED TO PREVENT FALLS:  Life alert? No  Use of a cane, walker or w/c? No  Grab bars in the bathroom? Yes  Shower chair or bench in shower? No  Elevated toilet seat or a handicapped toilet? No   TIMED UP AND GO:  Was the test performed? Yes .  Length of time to ambulate 10 feet: 10 sec.   Gait steady and fast without use of assistive device  Cognitive Function:Normal cognitive status assessed by direct observation by this Nurse Health Advisor. No abnormalities found.   MMSE - Mini Mental State Exam 06/13/2016  Orientation to time 5  Orientation to Place 5  Registration 3  Attention/ Calculation 5  Recall 3  Language- name 2 objects 2  Language- repeat 1  Language- follow 3 step command 3  Language- read & follow direction 1  Write a sentence 1  Copy design 1  Total score 30        Immunizations Immunization History  Administered Date(s) Administered   Fluad Quad(high Dose 65+) 12/04/2018, 12/26/2019   Influenza Split 12/21/2011   Influenza Whole 12/19/2007, 01/16/2009, 01/27/2010   Influenza, High Dose Seasonal PF 12/15/2014, 12/24/2016, 02/21/2018   Influenza-Unspecified 01/01/2014, 12/24/2016   Moderna Sars-Covid-2 Vaccination 06/01/2019, 06/29/2019, 07/21/2020   Pneumococcal Conjugate-13 05/01/2014, 09/16/2014    Pneumococcal Polysaccharide-23 12/19/2007, 04/25/2019   Td 10/16/2007   Zoster Recombinat (Shingrix) 07/06/2016, 10/05/2016    TDAP status: Due, Education has been provided regarding the importance of this vaccine. Advised may receive this vaccine at local pharmacy or Health Dept. Aware to provide a copy of the vaccination record if obtained from local pharmacy or Health Dept. Verbalized acceptance and understanding.  Flu Vaccine status: Up to date  Pneumococcal vaccine status: Up to date  Covid-19 vaccine status: Completed vaccines  Qualifies for Shingles Vaccine? No   Zostavax completed No   Shingrix Completed?: Yes  Screening Tests Health Maintenance  Topic Date Due   TETANUS/TDAP  10/15/2017   OPHTHALMOLOGY EXAM  10/27/2020   INFLUENZA VACCINE  11/09/2020   COVID-19 Vaccine (4 - Booster for Moderna series) 11/20/2020   HEMOGLOBIN A1C  04/21/2021   FOOT EXAM  10/19/2021   PNA vac Low Risk Adult  Completed   Zoster Vaccines- Shingrix  Completed   HPV VACCINES  Aged Out    Health Maintenance  Health Maintenance Due  Topic Date Due   TETANUS/TDAP  10/15/2017    Colorectal cancer screening: No longer required.   Lung Cancer Screening: (Low Dose CT Chest recommended if Age 59-80 years, 30 pack-year currently smoking OR have quit w/in 15years.) does not qualify.    Additional Screening:  Hepatitis C Screening: does  not qualify  Vision Screening: Recommended annual ophthalmology exams for early detection of glaucoma and other disorders of the eye. Is the patient up to date with their annual eye exam?  Yes  Who is the provider or what is the name of the office in which the patient attends annual eye exams? Dr. Lanell Matar   Dental Screening: Recommended annual dental exams for proper oral hygiene  Community Resource Referral / Chronic Care Management: CRR required this visit?  No   CCM required this visit?  No      Plan:     I have personally reviewed and  noted the following in the patient's chart:   Medical and social history Use of alcohol, tobacco or illicit drugs  Current medications and supplements including opioid prescriptions. Patient is not currently taking opioid prescriptions. Functional ability and status Nutritional status Physical activity Advanced directives List of other physicians Hospitalizations, surgeries, and ER visits in previous 12 months Vitals Screenings to include cognitive, depression, and falls Referrals and appointments  In addition, I have reviewed and discussed with patient certain preventive protocols, quality metrics, and best practice recommendations. A written personalized care plan for preventive services as well as general preventive health recommendations were provided to patient.     Marta Antu, LPN   [id number]  Nurse Health Advisor  Nurse Notes: None

## 2020-10-19 ENCOUNTER — Other Ambulatory Visit: Payer: Self-pay

## 2020-10-19 ENCOUNTER — Ambulatory Visit: Payer: Medicare HMO

## 2020-10-19 ENCOUNTER — Ambulatory Visit (INDEPENDENT_AMBULATORY_CARE_PROVIDER_SITE_OTHER): Payer: Medicare HMO | Admitting: Endocrinology

## 2020-10-19 ENCOUNTER — Ambulatory Visit (INDEPENDENT_AMBULATORY_CARE_PROVIDER_SITE_OTHER): Payer: Medicare HMO

## 2020-10-19 VITALS — BP 160/62 | HR 74 | Ht 65.0 in | Wt 162.0 lb

## 2020-10-19 VITALS — BP 130/68 | HR 79 | Temp 97.5°F | Resp 16 | Ht 65.0 in | Wt 162.8 lb

## 2020-10-19 DIAGNOSIS — IMO0002 Reserved for concepts with insufficient information to code with codable children: Secondary | ICD-10-CM

## 2020-10-19 DIAGNOSIS — Z0000 Encounter for general adult medical examination without abnormal findings: Secondary | ICD-10-CM | POA: Diagnosis not present

## 2020-10-19 DIAGNOSIS — E1139 Type 2 diabetes mellitus with other diabetic ophthalmic complication: Secondary | ICD-10-CM

## 2020-10-19 DIAGNOSIS — E1165 Type 2 diabetes mellitus with hyperglycemia: Secondary | ICD-10-CM

## 2020-10-19 LAB — POCT GLYCOSYLATED HEMOGLOBIN (HGB A1C): Hemoglobin A1C: 6.7 % — AB (ref 4.0–5.6)

## 2020-10-19 MED ORDER — METFORMIN HCL 1000 MG PO TABS: 1000.0000 mg | ORAL_TABLET | Freq: Every day | ORAL | 3 refills | Status: DC

## 2020-10-19 NOTE — Patient Instructions (Signed)
[name] [name] , Thank you for taking time to come for your Medicare Wellness Visit. I appreciate your ongoing commitment to your health goals. Please review the following plan we discussed and let me know if I can assist you in the future.   Screening recommendations/referrals: Colonoscopy: No longer required Recommended yearly ophthalmology/optometry visit for glaucoma screening and checkup Recommended yearly dental visit for hygiene and checkup  Vaccinations: Influenza vaccine: Up to date Pneumococcal vaccine: Up to date Tdap vaccine: Discuss with pharmacy Shingles vaccine: Completed vaccines   Covid-19: Up to date  Advanced directives: Please bring a copy for your chart  Conditions/risks identified: See problem list  Next appointment: Follow up in one year for your annual wellness visit. 10/21/2021 @9 :40  Preventive Care 84 Years and Older, Male Preventive care refers to lifestyle choices and visits with your health care provider that can promote health and wellness. What does preventive care include? A yearly physical exam. This is also called an annual well check. Dental exams once or twice a year. Routine eye exams. Ask your health care provider how often you should have your eyes checked. Personal lifestyle choices, including: Daily care of your teeth and gums. Regular physical activity. Eating a healthy diet. Avoiding tobacco and drug use. Limiting alcohol use. Practicing safe sex. Taking low doses of aspirin every day. Taking vitamin and mineral supplements as recommended by your health care provider. What happens during an annual well check? The services and screenings done by your health care provider during your annual well check will depend on your age, overall health, lifestyle risk factors, and family history of disease. Counseling  Your health care provider may ask you questions about your: Alcohol use. Tobacco use. Drug use. Emotional well-being. Home and  relationship well-being. Sexual activity. Eating habits. History of falls. Memory and ability to understand (cognition). Work and work Statistician. Screening  You may have the following tests or measurements: Height, weight, and BMI. Blood pressure. Lipid and cholesterol levels. These may be checked every 5 years, or more frequently if you are over 36 years old. Skin check. Lung cancer screening. You may have this screening every year starting at age 84 if you have a 30-pack-year history of smoking and currently smoke or have quit within the past 84 years. Fecal occult blood test (FOBT) of the stool. You may have this test every year starting at age 84. Flexible sigmoidoscopy or colonoscopy. You may have a sigmoidoscopy every 5 years or a colonoscopy every 10 years starting at age 84. Prostate cancer screening. Recommendations will vary depending on your family history and other risks. Hepatitis C blood test. Hepatitis B blood test. Sexually transmitted disease (STD) testing. Diabetes screening. This is done by checking your blood sugar (glucose) after you have not eaten for a while (fasting). You may have this done every 1-3 years. Abdominal aortic aneurysm (AAA) screening. You may need this if you are a current or former smoker. Osteoporosis. You may be screened starting at age 84 if you are at high risk. Talk with your health care provider about your test results, treatment options, and if necessary, the need for more tests. Vaccines  Your health care provider may recommend certain vaccines, such as: Influenza vaccine. This is recommended every year. Tetanus, diphtheria, and acellular pertussis (Tdap, Td) vaccine. You may need a Td booster every 10 years. Zoster vaccine. You may need this after age 84. Pneumococcal 13-valent conjugate (PCV13) vaccine. One dose is recommended after age 84. Pneumococcal polysaccharide (PPSV23)  vaccine. One dose is recommended after age 84. Talk to your  health care provider about which screenings and vaccines you need and how often you need them. This information is not intended to replace advice given to you by your health care provider. Make sure you discuss any questions you have with your health care provider. Document Released: 04/24/2015 Document Revised: 12/16/2015 Document Reviewed: 01/27/2015 Elsevier Interactive Patient Education  2017 [place] Prevention in the Home Falls can cause injuries. They can happen to people of all ages. There are many things you can do to make your home safe and to help prevent falls. What can I do on the outside of my home? Regularly fix the edges of walkways and driveways and fix any cracks. Remove anything that might make you trip as you walk through a door, such as a raised step or threshold. Trim any bushes or trees on the path to your home. Use bright outdoor lighting. Clear any walking paths of anything that might make someone trip, such as rocks or tools. Regularly check to see if handrails are loose or broken. Make sure that both sides of any steps have handrails. Any raised decks and porches should have guardrails on the edges. Have any leaves, snow, or ice cleared regularly. Use sand or salt on walking paths during winter. Clean up any spills in your garage right away. This includes oil or grease spills. What can I do in the bathroom? Use night lights. Install grab bars by the toilet and in the tub and shower. Do not use towel bars as grab bars. Use non-skid mats or decals in the tub or shower. If you need to sit down in the shower, use a plastic, non-slip stool. Keep the floor dry. Clean up any water that spills on the floor as soon as it happens. Remove soap buildup in the tub or shower regularly. Attach bath mats securely with double-sided non-slip rug tape. Do not have throw rugs and other things on the floor that can make you trip. What can I do in the bedroom? Use night  lights. Make sure that you have a light by your bed that is easy to reach. Do not use any sheets or blankets that are too big for your bed. They should not hang down onto the floor. Have a firm chair that has side arms. You can use this for support while you get dressed. Do not have throw rugs and other things on the floor that can make you trip. What can I do in the kitchen? Clean up any spills right away. Avoid walking on wet floors. Keep items that you use a lot in easy-to-reach places. If you need to reach something above you, use a strong step stool that has a grab bar. Keep electrical cords out of the way. Do not use floor polish or wax that makes floors slippery. If you must use wax, use non-skid floor wax. Do not have throw rugs and other things on the floor that can make you trip. What can I do with my stairs? Do not leave any items on the stairs. Make sure that there are handrails on both sides of the stairs and use them. Fix handrails that are broken or loose. Make sure that handrails are as long as the stairways. Check any carpeting to make sure that it is firmly attached to the stairs. Fix any carpet that is loose or worn. Avoid having throw rugs at the top or bottom  of the stairs. If you do have throw rugs, attach them to the floor with carpet tape. Make sure that you have a light switch at the top of the stairs and the bottom of the stairs. If you do not have them, ask someone to add them for you. What else can I do to help prevent falls? Wear shoes that: Do not have high heels. Have rubber bottoms. Are comfortable and fit you well. Are closed at the toe. Do not wear sandals. If you use a stepladder: Make sure that it is fully opened. Do not climb a closed stepladder. Make sure that both sides of the stepladder are locked into place. Ask someone to hold it for you, if possible. Clearly mark and make sure that you can see: Any grab bars or handrails. First and last  steps. Where the edge of each step is. Use tools that help you move around (mobility aids) if they are needed. These include: Canes. Walkers. Scooters. Crutches. Turn on the lights when you go into a dark area. Replace any light bulbs as soon as they burn out. Set up your furniture so you have a clear path. Avoid moving your furniture around. If any of your floors are uneven, fix them. If there are any pets around you, be aware of where they are. Review your medicines with your doctor. Some medicines can make you feel dizzy. This can increase your chance of falling. Ask your doctor what other things that you can do to help prevent falls. This information is not intended to replace advice given to you by your health care provider. Make sure you discuss any questions you have with your health care provider. Document Released: 01/22/2009 Document Revised: 09/03/2015 Document Reviewed: 05/02/2014 Elsevier Interactive Patient Education  2017 [employer].

## 2020-10-19 NOTE — Progress Notes (Signed)
Subjective:    Patient ID: [name] [name], male    DOB: [birth year], 86 y.o.   MRN: [id number]  HPI Pt returns for f/u of diabetes mellitus:  DM type: 2 Dx'ed: 9678 Complications: CRI, PAD, and DR Therapy: 2 oral meds DKA: never Severe hypoglycemia: never Pancreatitis: never Pancreatic imaging: normal on 2013 CT SDOH: he stopped Rybelsus, due to cost.  Other: he has never been on insulin, and he declines; renal insuff and edema limit rx options.   Interval history: no cbg record, but states cbg's vary from 80-150.  pt states he feels well in general.  He takes meds as rx'ed (but he does not take glipizide).   Past Medical History:  Diagnosis Date   Diabetes mellitus 1955   type 2 - fasting 100-120   Diverticulosis 2009   GERD (gastroesophageal reflux disease)    Glaucoma    bilateral   History of shingles    Hyperlipidemia    5 yrs   Hypertension    Neuromuscular disorder ([place])    due to shingles around abdomen area    Osteoarthritis    "knees" (01/31/2014)   Prostate cancer ([place])    "no treatments; it's going away by itself w/the pills I take"    Past Surgical History:  Procedure Laterality Date   ANTERIOR CERVICAL DECOMP/DISCECTOMY FUSION N/A 09/03/2015   Procedure: ANTERIOR CERVICAL DECOMPRESSION FUSION, CERVICAL 5-6 WITH INSTRUMENTATION AND ALLOGRAFT;  Surgeon: Phylliss Bob, MD;  Location: [place];  Service: Orthopedics;  Laterality: N/A;  ANTERIOR CERVICAL DECOMPRESSION FUSION, CERVICAL 5-6 WITH INSTRUMENTATION AND ALLOGRAFT   COLONOSCOPY     FINE NEEDLE ASPIRATION Left 06/10/15   Knee   FINE NEEDLE ASPIRATION  05/26/15   Back   JOINT REPLACEMENT     TONSILLECTOMY     TOTAL KNEE ARTHROPLASTY Right 01/31/2014   TOTAL KNEE ARTHROPLASTY Right 01/31/2014   Procedure: RIGHT TOTAL KNEE ARTHROPLASTY;  Surgeon: Kerin Salen, MD;  Location: [place];  Service: Orthopedics;  Laterality: Right;    Social History   Socioeconomic History   Marital status: Widowed    Spouse  name: Not on file   Number of children: Not on file   Years of education: Not on file   Highest education level: Not on file  Occupational History    Comment: retired Biomedical scientist     Comment: Restaurant manager, fast food  Tobacco Use   Smoking status: Former    Pack years: 0.00    Types: Cigars    Quit date: 04/12/1995    Years since quitting: 25.5   Smokeless tobacco: Never   Tobacco comments:    chews cigars still   Vaping Use   Vaping Use: Never used  Substance and Sexual Activity   Alcohol use: No   Drug use: No   Sexual activity: Never  Other Topics Concern   Not on file  Social History Narrative   Works out in the garden   Social Determinants of Radio broadcast assistant Strain: Not on Art therapist Insecurity: Not on file  Transportation Needs: Not on file  Physical Activity: Inactive   Days of Exercise per Week: 0 days   Minutes of Exercise per Session: 0 min  Stress: Not on file  Social Connections: Not on file  Intimate Partner Violence: Not At Risk   Fear of Current or Ex-Partner: No   Emotionally Abused: No   Physically Abused: No   Sexually Abused: No    Current Outpatient Medications on  File Prior to Visit  Medication Sig Dispense Refill   acetaminophen (TYLENOL) 500 MG tablet Take 500 mg by mouth every 6 (six) hours as needed.     Ascorbic Acid (VITAMIN C) 1000 MG tablet Take 1,000 mg by mouth daily.     aspirin 81 MG chewable tablet Chew by mouth daily.     atorvastatin (LIPITOR) 20 MG tablet Take 1 tablet (20 mg total) by mouth daily. 90 tablet 1   b complex vitamins tablet Take 1 tablet by mouth daily.     cholecalciferol (VITAMIN D3) 25 MCG (1000 UNIT) tablet Take 1,000 Units by mouth daily.     Coenzyme Q10 (CO Q 10 PO) Take 50 mg by mouth daily.     finasteride (PROSCAR) 5 MG tablet Take 5 mg by mouth daily.     furosemide (LASIX) 20 MG tablet Take 1 tablet (20 mg total) by mouth every other day. 45 tablet 1   gabapentin (NEURONTIN) 600 MG tablet Take 1 tablet  (600 mg total) by mouth 3 (three) times daily. 270 tablet 1   glucose blood (ACCU-CHEK AVIVA PLUS) test strip 1 each by Other route daily. And lancets 1/day 100 each 12   glucose blood (TRUE METRIX BLOOD GLUCOSE TEST) test strip Use as instructed 100 each 12   latanoprost (XALATAN) 0.005 % ophthalmic solution Place 1 drop into both eyes at bedtime.     mirtazapine (REMERON) 15 MG tablet TAKE 1 TABLET BY MOUTH EVERYDAY AT BEDTIME 90 tablet 1   omeprazole (PRILOSEC) 20 MG capsule Take 1 capsule (20 mg total) by mouth daily. 90 capsule 3   ramipril (ALTACE) 2.5 MG capsule Take 1 capsule (2.5 mg total) by mouth daily. 90 capsule 1   repaglinide (PRANDIN) 1 MG tablet Take 1 tablet (1 mg total) by mouth daily with breakfast. 90 tablet 3   traMADol (ULTRAM) 50 MG tablet Take 1 tablet by mouth daily.  0   TRUEplus Lancets 33G MISC Use as directed to check blood sugar up to two times a day. 100 each 3   No current facility-administered medications on file prior to visit.    No Known Allergies  Family History  Problem Relation Age of Onset   Arthritis Other    Diabetes Other    Cancer Other        prostate   Heart disease Brother        questionable   Colon cancer Daughter 14   Diabetes Mother    Colon polyps Neg Hx    Esophageal cancer Neg Hx    Rectal cancer Neg Hx    Stomach cancer Neg Hx     BP (!) 160/62 (BP Location: Right Arm, Patient Position: Sitting, Cuff Size: Normal)   Pulse 74   Ht 5\' 5"  (1.651 m)   Wt 162 lb (73.5 kg)   SpO2 97%   BMI 26.96 kg/m    Review of Systems He denies hypoglycemia.    Objective:   Physical Exam Pulses: dorsalis pedis intact bilat.   MSK: no deformity of the feet CV: no leg edema Skin:  no ulcer on the feet.  normal color and temp on the feet.   Neuro: sensation is intact to touch on the feet.   Ext: there is bilateral onychomycosis of the toenails.    Lab Results  Component Value Date   HGBA1C 6.7 (A) 10/19/2020   Lab Results   Component Value Date   CREATININE 1.14 (H) 12/26/2019  BUN 18 12/26/2019   NA 139 12/26/2019   K 4.7 12/26/2019   CL 105 12/26/2019   CO2 28 12/26/2019      Assessment & Plan:  Type 2 DM: overcontrolled CRI: reduce metformin  Patient Instructions  I have sent a prescription to your pharmacy, to reduce the metformin once a day (with breakfast) Please continue the same repaglinide.  check your blood sugar once a day.  vary the time of day when you check, between before the 3 meals, and at bedtime.  also check if you have symptoms of your blood sugar being too high or too low.  please keep a record of the readings and bring it to your next appointment here (or you can bring the meter itself).  You can write it on any piece of paper.  please call us sooner if your blood sugar goes below 70, or if you have a lot of readings over 200.  Please come back for a follow-up appointment in 3 months.

## 2020-10-19 NOTE — Patient Instructions (Addendum)
I have sent a prescription to your pharmacy, to reduce the metformin once a day (with breakfast) Please continue the same repaglinide.  check your blood sugar once a day.  vary the time of day when you check, between before the 3 meals, and at bedtime.  also check if you have symptoms of your blood sugar being too high or too low.  please keep a record of the readings and bring it to your next appointment here (or you can bring the meter itself).  You can write it on any piece of paper.  please call us sooner if your blood sugar goes below 70, or if you have a lot of readings over 200.  Please come back for a follow-up appointment in 3 months.

## 2020-12-03 DIAGNOSIS — H401131 Primary open-angle glaucoma, bilateral, mild stage: Secondary | ICD-10-CM | POA: Diagnosis not present

## 2020-12-03 DIAGNOSIS — E119 Type 2 diabetes mellitus without complications: Secondary | ICD-10-CM | POA: Diagnosis not present

## 2020-12-03 DIAGNOSIS — H2513 Age-related nuclear cataract, bilateral: Secondary | ICD-10-CM | POA: Diagnosis not present

## 2020-12-04 ENCOUNTER — Other Ambulatory Visit: Payer: Self-pay | Admitting: Family Medicine

## 2020-12-04 DIAGNOSIS — E1149 Type 2 diabetes mellitus with other diabetic neurological complication: Secondary | ICD-10-CM

## 2020-12-04 DIAGNOSIS — I10 Essential (primary) hypertension: Secondary | ICD-10-CM

## 2021-01-19 ENCOUNTER — Ambulatory Visit (INDEPENDENT_AMBULATORY_CARE_PROVIDER_SITE_OTHER): Payer: Medicare HMO | Admitting: Endocrinology

## 2021-01-19 ENCOUNTER — Other Ambulatory Visit: Payer: Self-pay

## 2021-01-19 DIAGNOSIS — E1165 Type 2 diabetes mellitus with hyperglycemia: Secondary | ICD-10-CM

## 2021-01-19 LAB — POCT GLYCOSYLATED HEMOGLOBIN (HGB A1C): Hemoglobin A1C: 6 % — AB (ref 4.0–5.6)

## 2021-01-19 MED ORDER — METFORMIN HCL ER 500 MG PO TB24: 500.0000 mg | ORAL_TABLET | Freq: Every day | ORAL | 3 refills | Status: DC

## 2021-01-19 MED ORDER — REPAGLINIDE 1 MG PO TABS: 1.0000 mg | ORAL_TABLET | Freq: Every day | ORAL | 3 refills | Status: DC

## 2021-01-19 NOTE — Patient Instructions (Addendum)
Your blood pressure is high today.  Please see your primary care provider soon, to have it rechecked I have sent a prescription to your pharmacy, to reduce the metformin again.  Please change the glipizide back to repaglinide.  check your blood sugar once a day.  vary the time of day when you check, between before the 3 meals, and at bedtime.  also check if you have symptoms of your blood sugar being too high or too low.  please keep a record of the readings and bring it to your next appointment here (or you can bring the meter itself).  You can write it on any piece of paper.  please call us sooner if your blood sugar goes below 70, or if you have a lot of readings over 200.  Please come back for a follow-up appointment in 3 months.

## 2021-01-19 NOTE — Progress Notes (Signed)
Subjective:    Patient ID: [name] [name], male    DOB: 09-Jun-1934, 86 y.o.   MRN: [id number]  HPI Pt returns for f/u of diabetes mellitus:  DM type: 2 Dx'ed: 7356 Complications: CRI, PAD, and DR.  Therapy: 2 oral meds DKA: never Severe hypoglycemia: never Pancreatitis: never Pancreatic imaging: normal on 2013 CT.  SDOH: he stopped Rybelsus, due to cost.  Other: he has never been on insulin, and he declines; renal insuff and edema limit rx options.   Interval history: no cbg record, but states cbg's are in the low-100's.  pt states he feels well in general.  He takes meds as rx'ed.  He has not recently taken repaglinide, as he wants to get from [place].  He instead takes glipizide.   Past Medical History:  Diagnosis Date   Diabetes mellitus 1955   type 2 - fasting 100-120   Diverticulosis 2009   GERD (gastroesophageal reflux disease)    Glaucoma    bilateral   History of shingles    Hyperlipidemia    5 yrs   Hypertension    Neuromuscular disorder ([place])    due to shingles around abdomen area    Osteoarthritis    "knees" (01/31/2014)   Prostate cancer ([place])    "no treatments; it's going away by itself w/the pills I take"    Past Surgical History:  Procedure Laterality Date   ANTERIOR CERVICAL DECOMP/DISCECTOMY FUSION N/A 09/03/2015   Procedure: ANTERIOR CERVICAL DECOMPRESSION FUSION, CERVICAL 5-6 WITH INSTRUMENTATION AND ALLOGRAFT;  Surgeon: Phylliss Bob, MD;  Location: [place];  Service: Orthopedics;  Laterality: N/A;  ANTERIOR CERVICAL DECOMPRESSION FUSION, CERVICAL 5-6 WITH INSTRUMENTATION AND ALLOGRAFT   COLONOSCOPY     FINE NEEDLE ASPIRATION Left 06/10/15   Knee   FINE NEEDLE ASPIRATION  05/26/15   Back   JOINT REPLACEMENT     TONSILLECTOMY     TOTAL KNEE ARTHROPLASTY Right 01/31/2014   TOTAL KNEE ARTHROPLASTY Right 01/31/2014   Procedure: RIGHT TOTAL KNEE ARTHROPLASTY;  Surgeon: Kerin Salen, MD;  Location: [facility];  Service: Orthopedics;  Laterality: Right;     Social History   Socioeconomic History   Marital status: Widowed    Spouse name: Not on file   Number of children: Not on file   Years of education: Not on file   Highest education level: Not on file  Occupational History    Comment: retired Biomedical scientist     Comment: Restaurant manager, fast food  Tobacco Use   Smoking status: Former    Types: Cigars    Quit date: 04/12/1995    Years since quitting: 25.7   Smokeless tobacco: Never   Tobacco comments:    chews cigars still   Vaping Use   Vaping Use: Never used  Substance and Sexual Activity   Alcohol use: No   Drug use: No   Sexual activity: Never  Other Topics Concern   Not on file  Social History Narrative   Works out in the garden   Social Determinants of Radio broadcast assistant Strain: Low Risk    Difficulty of Paying Living Expenses: Not hard at all  Food Insecurity: No Food Insecurity   Worried About Charity fundraiser in the Last Year: Never true   Arboriculturist in the Last Year: Never true  Transportation Needs: No Transportation Needs   Lack of Transportation (Medical): No   Lack of Transportation (Non-Medical): No  Physical Activity: Inactive   Days of  Exercise per Week: 0 days   Minutes of Exercise per Session: 0 min  Stress: No Stress Concern Present   Feeling of Stress : Not at all  Social Connections: Moderately Isolated   Frequency of Communication with Friends and Family: More than three times a week   Frequency of Social Gatherings with Friends and Family: More than three times a week   Attends Religious Services: More than 4 times per year   Active Member of [employer] or Organizations: No   Attends Archivist Meetings: Never   Marital Status: Widowed  Human resources officer Violence: Not At Risk   Fear of Current or Ex-Partner: No   Emotionally Abused: No   Physically Abused: No   Sexually Abused: No    Current Outpatient Medications on File Prior to Visit  Medication Sig Dispense Refill    acetaminophen (TYLENOL) 500 MG tablet Take 500 mg by mouth every 6 (six) hours as needed.     Ascorbic Acid (VITAMIN C) 1000 MG tablet Take 1,000 mg by mouth daily.     aspirin 81 MG chewable tablet Chew by mouth daily.     atorvastatin (LIPITOR) 20 MG tablet Take 1 tablet (20 mg total) by mouth daily. 90 tablet 1   b complex vitamins tablet Take 1 tablet by mouth daily.     cholecalciferol (VITAMIN D3) 25 MCG (1000 UNIT) tablet Take 1,000 Units by mouth daily.     Coenzyme Q10 (CO Q 10 PO) Take 50 mg by mouth daily.     finasteride (PROSCAR) 5 MG tablet Take 5 mg by mouth daily.     furosemide (LASIX) 20 MG tablet TAKE 1 TABLET EVERY OTHER DAY 45 tablet 0   gabapentin (NEURONTIN) 600 MG tablet TAKE 1 TABLET THREE TIMES DAILY 270 tablet 0   glucose blood (ACCU-CHEK AVIVA PLUS) test strip 1 each by Other route daily. And lancets 1/day 100 each 12   glucose blood (TRUE METRIX BLOOD GLUCOSE TEST) test strip Use as instructed 100 each 12   latanoprost (XALATAN) 0.005 % ophthalmic solution Place 1 drop into both eyes at bedtime.     mirtazapine (REMERON) 15 MG tablet TAKE 1 TABLET BY MOUTH EVERYDAY AT BEDTIME 90 tablet 1   omeprazole (PRILOSEC) 20 MG capsule Take 1 capsule (20 mg total) by mouth daily. 90 capsule 3   ramipril (ALTACE) 2.5 MG capsule Take 1 capsule (2.5 mg total) by mouth daily. 90 capsule 1   traMADol (ULTRAM) 50 MG tablet Take 1 tablet by mouth daily.  0   TRUEplus Lancets 33G MISC Use as directed to check blood sugar up to two times a day. 100 each 3   No current facility-administered medications on file prior to visit.    No Known Allergies  Family History  Problem Relation Age of Onset   Arthritis Other    Diabetes Other    Cancer Other        prostate   Heart disease Brother        questionable   Colon cancer Daughter 73   Diabetes Mother    Colon polyps Neg Hx    Esophageal cancer Neg Hx    Rectal cancer Neg Hx    Stomach cancer Neg Hx     There were no  vitals taken for this visit.   Review of Systems He denies hypoglycemia.      Objective:   Physical Exam Pulses: dorsalis pedis intact bilat.   MSK: no deformity of the  feet CV: no leg edema Skin:  no ulcer on the feet.  normal color and temp on the feet.  Neuro: sensation is intact to touch on the feet.    Lab Results  Component Value Date   CREATININE 1.14 (H) 12/26/2019   BUN 18 12/26/2019   NA 139 12/26/2019   K 4.7 12/26/2019   CL 105 12/26/2019   CO2 28 12/26/2019   A1c=6.0%    Assessment & Plan:  Type 2 DM. Overcontrolled.  CRI: we should reduce metformin.   Patient Instructions  Your blood pressure is high today.  Please see your primary care provider soon, to have it rechecked I have sent a prescription to your pharmacy, to reduce the metformin again.  Please change the glipizide back to repaglinide.  check your blood sugar once a day.  vary the time of day when you check, between before the 3 meals, and at bedtime.  also check if you have symptoms of your blood sugar being too high or too low.  please keep a record of the readings and bring it to your next appointment here (or you can bring the meter itself).  You can write it on any piece of paper.  please call us sooner if your blood sugar goes below 70, or if you have a lot of readings over 200.  Please come back for a follow-up appointment in 3 months.

## 2021-01-29 ENCOUNTER — Telehealth: Payer: Self-pay | Admitting: Family Medicine

## 2021-01-29 NOTE — Telephone Encounter (Signed)
Left message for patient to call back and reschedule Medicare Annual Wellness Visit (AWV) in office.   Patient has appointment scheduled on 10/21/21. [facility] has a meeting.  Left office number and my jabber [phone].  Last AWV:10/19/2020  Please schedule at anytime with Nurse Health Advisor.

## 2021-04-16 ENCOUNTER — Other Ambulatory Visit: Payer: Self-pay | Admitting: Family Medicine

## 2021-04-22 ENCOUNTER — Ambulatory Visit (INDEPENDENT_AMBULATORY_CARE_PROVIDER_SITE_OTHER): Payer: Medicare HMO | Admitting: Endocrinology

## 2021-04-22 ENCOUNTER — Other Ambulatory Visit: Payer: Self-pay

## 2021-04-22 VITALS — BP 190/94 | HR 64 | Ht 65.0 in | Wt 165.6 lb

## 2021-04-22 DIAGNOSIS — E1151 Type 2 diabetes mellitus with diabetic peripheral angiopathy without gangrene: Secondary | ICD-10-CM

## 2021-04-22 DIAGNOSIS — E1165 Type 2 diabetes mellitus with hyperglycemia: Secondary | ICD-10-CM

## 2021-04-22 LAB — POCT GLYCOSYLATED HEMOGLOBIN (HGB A1C): Hemoglobin A1C: 7 % — AB (ref 4.0–5.6)

## 2021-04-22 LAB — BASIC METABOLIC PANEL
BUN: 13 mg/dL (ref 6–23)
CO2: 29 mEq/L (ref 19–32)
Calcium: 8.8 mg/dL (ref 8.4–10.5)
Chloride: 103 mEq/L (ref 96–112)
Creatinine, Ser: 1.23 mg/dL (ref 0.40–1.50)
GFR: 53.87 mL/min — ABNORMAL LOW (ref >60.00)
Glucose, Bld: 116 mg/dL — ABNORMAL HIGH (ref 70–99)
Potassium: 4.3 mEq/L (ref 3.5–5.1)
Sodium: 139 mEq/L (ref 135–145)

## 2021-04-22 LAB — T4, FREE: Free T4: 1.06 ng/dL (ref 0.60–1.60)

## 2021-04-22 LAB — TSH: TSH: 2.69 u[IU]/mL (ref 0.35–5.50)

## 2021-04-22 MED ORDER — REPAGLINIDE 1 MG PO TABS: 1.0000 mg | ORAL_TABLET | Freq: Every day | ORAL | 3 refills | Status: DC

## 2021-04-22 NOTE — Patient Instructions (Addendum)
Your blood pressure is high today.  Please see your primary care provider soon, to have it rechecked Please continue the same 2 diabetes medications check your blood sugar once a day.  vary the time of day when you check, between before the 3 meals, and at bedtime.  also check if you have symptoms of your blood sugar being too high or too low.  please keep a record of the readings and bring it to your next appointment here (or you can bring the meter itself).  You can write it on any piece of paper.  please call us sooner if your blood sugar goes below 70, or if you have a lot of readings over 200.   Blood tests are requested for you today.  We'll let you know about the results.  Please come back for a follow-up appointment in 3-4 months.

## 2021-04-22 NOTE — Progress Notes (Signed)
Subjective:    Patient ID: [name] [name], male    DOB: 10-21-1934, 87 y.o.   MRN: [id number]  HPI Pt returns for f/u of diabetes mellitus:  DM type: 2 Dx'ed: 7939 Complications: CRI, PAD, and DR.  Therapy: 2 oral meds DKA: never Severe hypoglycemia: never Pancreatitis: never Pancreatic imaging: normal on 2013 CT.  SDOH: he stopped Rybelsus, due to cost.  Other: he has never been on insulin, and he declines; renal insuff and edema limit rx options.   Interval history: no cbg record, but states cbg's vary from 130-180.  pt states he feels well in general.  He takes meds as rx'ed.   Past Medical History:  Diagnosis Date   Diabetes mellitus 1955   type 2 - fasting 100-120   Diverticulosis 2009   GERD (gastroesophageal reflux disease)    Glaucoma    bilateral   History of shingles    Hyperlipidemia    5 yrs   Hypertension    Neuromuscular disorder ([place])    due to shingles around abdomen area    Osteoarthritis    "knees" (01/31/2014)   Prostate cancer ([place])    "no treatments; it's going away by itself w/the pills I take"    Past Surgical History:  Procedure Laterality Date   ANTERIOR CERVICAL DECOMP/DISCECTOMY FUSION N/A 09/03/2015   Procedure: ANTERIOR CERVICAL DECOMPRESSION FUSION, CERVICAL 5-6 WITH INSTRUMENTATION AND ALLOGRAFT;  Surgeon: Phylliss Bob, MD;  Location: [place];  Service: Orthopedics;  Laterality: N/A;  ANTERIOR CERVICAL DECOMPRESSION FUSION, CERVICAL 5-6 WITH INSTRUMENTATION AND ALLOGRAFT   COLONOSCOPY     FINE NEEDLE ASPIRATION Left 06/10/15   Knee   FINE NEEDLE ASPIRATION  05/26/15   Back   JOINT REPLACEMENT     TONSILLECTOMY     TOTAL KNEE ARTHROPLASTY Right 01/31/2014   TOTAL KNEE ARTHROPLASTY Right 01/31/2014   Procedure: RIGHT TOTAL KNEE ARTHROPLASTY;  Surgeon: Kerin Salen, MD;  Location: [place];  Service: Orthopedics;  Laterality: Right;    Social History   Socioeconomic History   Marital status: Widowed    Spouse name: Not on file   Number of  children: Not on file   Years of education: Not on file   Highest education level: Not on file  Occupational History    Comment: retired Biomedical scientist     Comment: Restaurant manager, fast food  Tobacco Use   Smoking status: Former    Types: Cigars    Quit date: 04/12/1995    Years since quitting: 26.0   Smokeless tobacco: Never   Tobacco comments:    chews cigars still   Vaping Use   Vaping Use: Never used  Substance and Sexual Activity   Alcohol use: No   Drug use: No   Sexual activity: Never  Other Topics Concern   Not on file  Social History Narrative   Works out in the garden   Social Determinants of Radio broadcast assistant Strain: Low Risk    Difficulty of Paying Living Expenses: Not hard at all  Food Insecurity: No Food Insecurity   Worried About Charity fundraiser in the Last Year: Never true   Arboriculturist in the Last Year: Never true  Transportation Needs: No Transportation Needs   Lack of Transportation (Medical): No   Lack of Transportation (Non-Medical): No  Physical Activity: Inactive   Days of Exercise per Week: 0 days   Minutes of Exercise per Session: 0 min  Stress: No Stress Concern Present  Feeling of Stress : Not at all  Social Connections: Moderately Isolated   Frequency of Communication with Friends and Family: More than three times a week   Frequency of Social Gatherings with Friends and Family: More than three times a week   Attends Religious Services: More than 4 times per year   Active Member of [employer] or Organizations: No   Attends Archivist Meetings: Never   Marital Status: Widowed  Human resources officer Violence: Not At Risk   Fear of Current or Ex-Partner: No   Emotionally Abused: No   Physically Abused: No   Sexually Abused: No    Current Outpatient Medications on File Prior to Visit  Medication Sig Dispense Refill   acetaminophen (TYLENOL) 500 MG tablet Take 500 mg by mouth every 6 (six) hours as needed.     Ascorbic Acid (VITAMIN C) 1000  MG tablet Take 1,000 mg by mouth daily.     aspirin 81 MG chewable tablet Chew by mouth daily.     atorvastatin (LIPITOR) 20 MG tablet Take 1 tablet (20 mg total) by mouth daily. 90 tablet 1   b complex vitamins tablet Take 1 tablet by mouth daily.     cholecalciferol (VITAMIN D3) 25 MCG (1000 UNIT) tablet Take 1,000 Units by mouth daily.     Coenzyme Q10 (CO Q 10 PO) Take 50 mg by mouth daily.     finasteride (PROSCAR) 5 MG tablet Take 5 mg by mouth daily.     furosemide (LASIX) 20 MG tablet TAKE 1 TABLET EVERY OTHER DAY 45 tablet 0   gabapentin (NEURONTIN) 600 MG tablet TAKE 1 TABLET THREE TIMES DAILY 270 tablet 0   glucose blood (ACCU-CHEK AVIVA PLUS) test strip 1 each by Other route daily. And lancets 1/day 100 each 12   glucose blood (TRUE METRIX BLOOD GLUCOSE TEST) test strip Use as instructed 100 each 12   latanoprost (XALATAN) 0.005 % ophthalmic solution Place 1 drop into both eyes at bedtime.     metFORMIN (GLUCOPHAGE-XR) 500 MG 24 hr tablet Take 1 tablet (500 mg total) by mouth daily with breakfast. 90 tablet 3   mirtazapine (REMERON) 15 MG tablet TAKE 1 TABLET BY MOUTH EVERYDAY AT BEDTIME 90 tablet 1   omeprazole (PRILOSEC) 20 MG capsule Take 1 capsule (20 mg total) by mouth daily. 90 capsule 3   ramipril (ALTACE) 2.5 MG capsule Take 1 capsule (2.5 mg total) by mouth daily. 90 capsule 1   traMADol (ULTRAM) 50 MG tablet Take 1 tablet by mouth daily.  0   TRUEplus Lancets 33G MISC Use as directed to check blood sugar up to two times a day. 100 each 3   No current facility-administered medications on file prior to visit.    No Known Allergies  Family History  Problem Relation Age of Onset   Arthritis Other    Diabetes Other    Cancer Other        prostate   Heart disease Brother        questionable   Colon cancer Daughter 60   Diabetes Mother    Colon polyps Neg Hx    Esophageal cancer Neg Hx    Rectal cancer Neg Hx    Stomach cancer Neg Hx     BP (!) 190/94    Pulse  64    Ht 5\' 5"  (1.651 m)    Wt 165 lb 9.6 oz (75.1 kg)    SpO2 99%    BMI 27.56 kg/m  Review of Systems He denies hypoglycemia.      Objective:   Physical Exam VITAL SIGNS:  See vs page.   GENERAL: no distress Pulses: dorsalis pedis intact bilat.   MSK: no deformity of the feet CV: trace bilat leg edema.   Skin:  no ulcer on the feet.  normal color and temp on the feet.   Neuro: sensation is intact to touch on the feet.     Lab Results  Component Value Date   TSH 1.19 12/26/2019   T4TOTAL 10.5 12/26/2019   Lab Results  Component Value Date   CREATININE 1.14 (H) 12/26/2019   BUN 18 12/26/2019   NA 139 12/26/2019   K 4.7 12/26/2019   CL 105 12/26/2019   CO2 28 12/26/2019   Lab Results  Component Value Date   HGBA1C 7.0 (A) 04/22/2021      Assessment & Plan:  Type 2 DM.  well-controlled Dyslipidemia: check TFT  Patient Instructions  Your blood pressure is high today.  Please see your primary care provider soon, to have it rechecked Please continue the same 2 diabetes medications check your blood sugar once a day.  vary the time of day when you check, between before the 3 meals, and at bedtime.  also check if you have symptoms of your blood sugar being too high or too low.  please keep a record of the readings and bring it to your next appointment here (or you can bring the meter itself).  You can write it on any piece of paper.  please call us sooner if your blood sugar goes below 70, or if you have a lot of readings over 200.   Blood tests are requested for you today.  We'll let you know about the results.  Please come back for a follow-up appointment in 3-4 months.

## 2021-04-28 ENCOUNTER — Ambulatory Visit: Payer: Medicare HMO

## 2021-04-29 ENCOUNTER — Other Ambulatory Visit: Payer: Self-pay | Admitting: *Deleted

## 2021-04-29 ENCOUNTER — Ambulatory Visit (INDEPENDENT_AMBULATORY_CARE_PROVIDER_SITE_OTHER): Payer: Medicare HMO | Admitting: Family Medicine

## 2021-04-29 ENCOUNTER — Encounter: Payer: Self-pay | Admitting: Family Medicine

## 2021-04-29 VITALS — BP 142/78 | HR 78 | Temp 98.4°F | Resp 18 | Ht 65.0 in | Wt 167.2 lb

## 2021-04-29 DIAGNOSIS — Z23 Encounter for immunization: Secondary | ICD-10-CM

## 2021-04-29 DIAGNOSIS — R350 Frequency of micturition: Secondary | ICD-10-CM | POA: Diagnosis not present

## 2021-04-29 DIAGNOSIS — M5412 Radiculopathy, cervical region: Secondary | ICD-10-CM

## 2021-04-29 DIAGNOSIS — I10 Essential (primary) hypertension: Secondary | ICD-10-CM | POA: Diagnosis not present

## 2021-04-29 DIAGNOSIS — E1169 Type 2 diabetes mellitus with other specified complication: Secondary | ICD-10-CM | POA: Diagnosis not present

## 2021-04-29 DIAGNOSIS — R972 Elevated prostate specific antigen [PSA]: Secondary | ICD-10-CM

## 2021-04-29 DIAGNOSIS — C61 Malignant neoplasm of prostate: Secondary | ICD-10-CM | POA: Insufficient documentation

## 2021-04-29 DIAGNOSIS — E1151 Type 2 diabetes mellitus with diabetic peripheral angiopathy without gangrene: Secondary | ICD-10-CM

## 2021-04-29 DIAGNOSIS — E785 Hyperlipidemia, unspecified: Secondary | ICD-10-CM | POA: Diagnosis not present

## 2021-04-29 DIAGNOSIS — G959 Disease of spinal cord, unspecified: Secondary | ICD-10-CM

## 2021-04-29 LAB — POC URINALSYSI DIPSTICK (AUTOMATED)
Bilirubin, UA: NEGATIVE
Blood, UA: NEGATIVE
Glucose, UA: NEGATIVE
Ketones, UA: NEGATIVE
Leukocytes, UA: NEGATIVE
Nitrite, UA: NEGATIVE
Protein, UA: NEGATIVE
Spec Grav, UA: 1.015 (ref 1.010–1.025)
Urobilinogen, UA: 0.2 E.U./dL
pH, UA: 7 (ref 5.0–8.0)

## 2021-04-29 MED ORDER — RAMIPRIL 5 MG PO CAPS: 5.0000 mg | ORAL_CAPSULE | Freq: Every day | ORAL | 3 refills | Status: DC

## 2021-04-29 NOTE — Assessment & Plan Note (Signed)
Per u rology 

## 2021-04-29 NOTE — Progress Notes (Signed)
Subjective:   By signing my name below, I, [name], attest that this documentation has been prepared under the direction and in the presence of Dr. Roma Schanz, DO. 04/29/2021    Patient ID: [name] [name], male    DOB: [birth year], 87 y.o.   MRN: [id number]  Chief Complaint  Patient presents with   Kidney Concerns    HPI Patient is in today for a office visit.  He complains of urinary frequency. His blood pressure is elevated during this visit. His blood pressure was elevated while seeing his endocrinologist--- Dr Loanne Drilling asked the pt to f/u here for his bp  BP Readings from Last 3 Encounters:  04/29/21 (!) 142/78  04/22/21 (!) 190/94  10/19/20 130/68   Pulse Readings from Last 3 Encounters:  04/29/21 78  04/22/21 64  10/19/20 79   He continues seeing a specialist to manage his prostate. He has an upcomming appointment next month.  He is interested in receiving a flu vaccine during this visit.    Past Medical History:  Diagnosis Date   Diabetes mellitus 1955   type 2 - fasting 100-120   Diverticulosis 2009   GERD (gastroesophageal reflux disease)    Glaucoma    bilateral   History of shingles    Hyperlipidemia    5 yrs   Hypertension    Neuromuscular disorder ([place])    due to shingles around abdomen area    Osteoarthritis    "knees" (01/31/2014)   Prostate cancer ([place])    "no treatments; it's going away by itself w/the pills I take"    Past Surgical History:  Procedure Laterality Date   ANTERIOR CERVICAL DECOMP/DISCECTOMY FUSION N/A 09/03/2015   Procedure: ANTERIOR CERVICAL DECOMPRESSION FUSION, CERVICAL 5-6 WITH INSTRUMENTATION AND ALLOGRAFT;  Surgeon: Phylliss Bob, MD;  Location: [facility];  Service: Orthopedics;  Laterality: N/A;  ANTERIOR CERVICAL DECOMPRESSION FUSION, CERVICAL 5-6 WITH INSTRUMENTATION AND ALLOGRAFT   COLONOSCOPY     FINE NEEDLE ASPIRATION Left 06/10/15   Knee   FINE NEEDLE ASPIRATION  05/26/15   Back   JOINT REPLACEMENT      TONSILLECTOMY     TOTAL KNEE ARTHROPLASTY Right 01/31/2014   TOTAL KNEE ARTHROPLASTY Right 01/31/2014   Procedure: RIGHT TOTAL KNEE ARTHROPLASTY;  Surgeon: Kerin Salen, MD;  Location: [facility];  Service: Orthopedics;  Laterality: Right;    Family History  Problem Relation Age of Onset   Arthritis Other    Diabetes Other    Cancer Other        prostate   Heart disease Brother        questionable   Colon cancer Daughter 51   Diabetes Mother    Colon polyps Neg Hx    Esophageal cancer Neg Hx    Rectal cancer Neg Hx    Stomach cancer Neg Hx     Social History   Socioeconomic History   Marital status: Widowed    Spouse name: Not on file   Number of children: Not on file   Years of education: Not on file   Highest education level: Not on file  Occupational History    Comment: retired Biomedical scientist     Comment: Restaurant manager, fast food  Tobacco Use   Smoking status: Former    Types: Cigars    Quit date: 04/12/1995    Years since quitting: 26.0   Smokeless tobacco: Never   Tobacco comments:    chews cigars still   Vaping Use   Vaping Use: Never  used  Substance and Sexual Activity   Alcohol use: No   Drug use: No   Sexual activity: Never  Other Topics Concern   Not on file  Social History Narrative   Works out in the garden   Social Determinants of Radio broadcast assistant Strain: Low Risk    Difficulty of Paying Living Expenses: Not hard at all  Food Insecurity: No Food Insecurity   Worried About Charity fundraiser in the Last Year: Never true   Arboriculturist in the Last Year: Never true  Transportation Needs: No Transportation Needs   Lack of Transportation (Medical): No   Lack of Transportation (Non-Medical): No  Physical Activity: Inactive   Days of Exercise per Week: 0 days   Minutes of Exercise per Session: 0 min  Stress: No Stress Concern Present   Feeling of Stress : Not at all  Social Connections: Moderately Isolated   Frequency of Communication with Friends and  Family: More than three times a week   Frequency of Social Gatherings with Friends and Family: More than three times a week   Attends Religious Services: More than 4 times per year   Active Member of [employer] or Organizations: No   Attends Archivist Meetings: Never   Marital Status: Widowed  Human resources officer Violence: Not At Risk   Fear of Current or Ex-Partner: No   Emotionally Abused: No   Physically Abused: No   Sexually Abused: No    Outpatient Medications Prior to Visit  Medication Sig Dispense Refill   acetaminophen (TYLENOL) 500 MG tablet Take 500 mg by mouth every 6 (six) hours as needed.     Ascorbic Acid (VITAMIN C) 1000 MG tablet Take 1,000 mg by mouth daily.     aspirin 81 MG chewable tablet Chew by mouth daily.     atorvastatin (LIPITOR) 20 MG tablet Take 1 tablet (20 mg total) by mouth daily. 90 tablet 1   b complex vitamins tablet Take 1 tablet by mouth daily.     cholecalciferol (VITAMIN D3) 25 MCG (1000 UNIT) tablet Take 1,000 Units by mouth daily.     Coenzyme Q10 (CO Q 10 PO) Take 50 mg by mouth daily.     finasteride (PROSCAR) 5 MG tablet Take 5 mg by mouth daily.     furosemide (LASIX) 20 MG tablet TAKE 1 TABLET EVERY OTHER DAY 45 tablet 0   gabapentin (NEURONTIN) 600 MG tablet TAKE 1 TABLET THREE TIMES DAILY 270 tablet 0   glucose blood (ACCU-CHEK AVIVA PLUS) test strip 1 each by Other route daily. And lancets 1/day 100 each 12   glucose blood (TRUE METRIX BLOOD GLUCOSE TEST) test strip Use as instructed 100 each 12   latanoprost (XALATAN) 0.005 % ophthalmic solution Place 1 drop into both eyes at bedtime.     metFORMIN (GLUCOPHAGE-XR) 500 MG 24 hr tablet Take 1 tablet (500 mg total) by mouth daily with breakfast. 90 tablet 3   mirtazapine (REMERON) 15 MG tablet TAKE 1 TABLET BY MOUTH EVERYDAY AT BEDTIME 90 tablet 1   omeprazole (PRILOSEC) 20 MG capsule Take 1 capsule (20 mg total) by mouth daily. 90 capsule 3   repaglinide (PRANDIN) 1 MG tablet Take 1  tablet (1 mg total) by mouth daily with breakfast. 90 tablet 3   traMADol (ULTRAM) 50 MG tablet Take 1 tablet by mouth daily.  0   TRUEplus Lancets 33G MISC Use as directed to check blood sugar up to  two times a day. 100 each 3   ramipril (ALTACE) 2.5 MG capsule Take 1 capsule (2.5 mg total) by mouth daily. 90 capsule 1   No facility-administered medications prior to visit.    No Known Allergies  Review of Systems  Constitutional:  Negative for fever and malaise/fatigue.  HENT:  Negative for congestion.   Eyes:  Negative for blurred vision.  Respiratory:  Negative for cough and shortness of breath.   Cardiovascular:  Negative for chest pain, palpitations and leg swelling.  Gastrointestinal:  Negative for vomiting.  Genitourinary:  Positive for frequency.  Musculoskeletal:  Negative for back pain.  Skin:  Negative for rash.  Neurological:  Negative for loss of consciousness and headaches.      Objective:    Physical Exam Vitals and nursing note reviewed.  Constitutional:      General: He is not in acute distress.    Appearance: Normal appearance. He is well-developed. He is not ill-appearing.  HENT:     Head: Normocephalic and atraumatic.     Right Ear: External ear normal.     Left Ear: External ear normal.  Eyes:     Extraocular Movements: Extraocular movements intact.     Pupils: Pupils are equal, round, and reactive to light.  Neck:     Thyroid: No thyromegaly.  Cardiovascular:     Rate and Rhythm: Normal rate and regular rhythm.     Heart sounds: Normal heart sounds. No murmur heard.   No gallop.  Pulmonary:     Effort: Pulmonary effort is normal. No respiratory distress.     Breath sounds: Normal breath sounds. No wheezing or rales.  Chest:     Chest wall: No tenderness.  Musculoskeletal:     Cervical back: Normal range of motion and neck supple.     Right hip: Tenderness present. Normal range of motion. Normal strength.     Left hip: Tenderness present. Normal  range of motion. Normal strength.     Right foot: Bony tenderness present. No swelling.     Left foot: Bony tenderness present. No swelling.  Skin:    General: Skin is warm and dry.  Neurological:     Mental Status: He is alert and oriented to person, place, and time.  Psychiatric:        Behavior: Behavior normal.        Thought Content: Thought content normal.        Judgment: Judgment normal.    BP (!) 142/78 (BP Location: Left Arm, Patient Position: Sitting, Cuff Size: Normal)    Pulse 78    Temp 98.4 F (36.9 C) (Oral)    Resp 18    Ht 5\' 5"  (1.651 m)    Wt 167 lb 3.2 oz (75.8 kg)    SpO2 96%    BMI 27.82 kg/m  Wt Readings from Last 3 Encounters:  04/29/21 167 lb 3.2 oz (75.8 kg)  04/22/21 165 lb 9.6 oz (75.1 kg)  10/19/20 162 lb 12.8 oz (73.8 kg)    Diabetic Foot Exam - Simple   No data filed    Lab Results  Component Value Date   WBC 4.4 12/26/2019   HGB 11.3 (L) 12/26/2019   HCT 32.8 (L) 12/26/2019   PLT 333 12/26/2019   GLUCOSE 116 (H) 04/22/2021   CHOL 121 12/26/2019   TRIG 95 12/26/2019   HDL 42 12/26/2019   LDLCALC 61 12/26/2019   ALT 7 (L) 12/26/2019   AST 12 12/26/2019  NA 139 04/22/2021   K 4.3 04/22/2021   CL 103 04/22/2021   CREATININE 1.23 04/22/2021   BUN 13 04/22/2021   CO2 29 04/22/2021   TSH 2.69 04/22/2021   PSA 1.20 12/26/2019   INR 0.95 08/31/2015   HGBA1C 7.0 (A) 04/22/2021   MICROALBUR 0.9 06/30/2015    Lab Results  Component Value Date   TSH 2.69 04/22/2021   Lab Results  Component Value Date   WBC 4.4 12/26/2019   HGB 11.3 (L) 12/26/2019   HCT 32.8 (L) 12/26/2019   MCV 95.1 12/26/2019   PLT 333 12/26/2019   Lab Results  Component Value Date   NA 139 04/22/2021   K 4.3 04/22/2021   CO2 29 04/22/2021   GLUCOSE 116 (H) 04/22/2021   BUN 13 04/22/2021   CREATININE 1.23 04/22/2021   BILITOT 0.5 12/26/2019   ALKPHOS 55 12/10/2019   AST 12 12/26/2019   ALT 7 (L) 12/26/2019   PROT 6.3 12/26/2019   ALBUMIN 4.1 12/10/2019    CALCIUM 8.8 04/22/2021   ANIONGAP 5 08/31/2015   GFR 53.87 (L) 04/22/2021   Lab Results  Component Value Date   CHOL 121 12/26/2019   Lab Results  Component Value Date   HDL 42 12/26/2019   Lab Results  Component Value Date   LDLCALC 61 12/26/2019   Lab Results  Component Value Date   TRIG 95 12/26/2019   Lab Results  Component Value Date   CHOLHDL 2.9 12/26/2019   Lab Results  Component Value Date   HGBA1C 7.0 (A) 04/22/2021       Assessment & Plan:   Problem List Items Addressed This Visit       Unprioritized   Myelopathy of cervical spinal cord with cervical radiculopathy (HCC)   DM (diabetes mellitus) type II, controlled, with peripheral vascular disorder ([place])    Per endo      Relevant Medications   ramipril (ALTACE) 5 MG capsule   ELEVATED PROSTATE SPECIFIC ANTIGEN    Per urology      Essential hypertension    Poorly controlled will alter medications, encouraged DASH diet, minimize caffeine and obtain adequate sleep. Report concerning symptoms and follow up as directed and as needed      Relevant Medications   ramipril (ALTACE) 5 MG capsule   Hyperlipidemia associated with type 2 diabetes mellitus ([place]) - Primary    Tolerating statin, encouraged heart healthy diet, avoid trans fats, minimize simple carbs and saturated fats. Increase exercise as tolerated      Relevant Medications   ramipril (ALTACE) 5 MG capsule   Other Relevant Orders   Lipid panel   Comprehensive metabolic panel   Microalbumin / creatinine urine ratio   Prostate cancer ([place])   Other Visit Diagnoses     Primary hypertension       Relevant Medications   ramipril (ALTACE) 5 MG capsule   Other Relevant Orders   Lipid panel   Comprehensive metabolic panel   Microalbumin / creatinine urine ratio   Urinary frequency       Relevant Orders   POCT Urinalysis Dipstick (Automated) (Completed)   PSA   Need for influenza vaccination       Relevant Orders   Flu Vaccine QUAD  High Dose(Fluad)        Meds ordered this encounter  Medications   DISCONTD: ramipril (ALTACE) 5 MG capsule    Sig: Take 1 capsule (5 mg total) by mouth daily.    Dispense:  90 capsule    Refill:  3   ramipril (ALTACE) 5 MG capsule    Sig: Take 1 capsule (5 mg total) by mouth daily.    Dispense:  90 capsule    Refill:  3    I, Dr. Roma Schanz, DO, personally preformed the services described in this documentation.  All medical record entries made by the scribe were at my direction and in my presence.  I have reviewed the chart and discharge instructions (if applicable) and agree that the record reflects my personal performance and is accurate and complete. 04/29/2021   I,[name],acting as a scribe for Ann Held, DO.,have documented all relevant documentation on the behalf of Ann Held, DO,as directed by  Ann Held, DO while in the presence of Ann Held, DO.   Ann Held, DO

## 2021-04-29 NOTE — Patient Instructions (Signed)
Urinary Frequency, Adult Urinary frequency means urinating more often than usual. You may urinate every 1-2 hours even though you drink a normal amount of fluid and do not have a bladder infection or condition. Although you urinate more often than normal, the total amount of urine produced in a day is normal. With urinary frequency, you may have an urgent need to urinate often. The stress and anxiety of needing to find a bathroom quickly can make this urge worse. This condition may go away on its own, or you may need treatment at home. Home treatment may include bladder training, exercises, taking medicines, or making changes to your diet. Follow these instructions at home: Bladder health Your health care provider will tell you what to do to improve bladder health. You may be told to: Keep a bladder diary. Keep track of: What you eat and drink. How often you urinate. How much you urinate. Follow a bladder training program. This may include: Learning to delay going to the bathroom. Double urinating, also called voiding. This helps if you are not completely emptying your bladder. Scheduled voiding. Do Kegel exercises. Kegel exercises strengthen the muscles that help control urination, which may help the condition.  Eating and drinking Follow instructions from your health care provider about eating or drinking restrictions. You may be told to: Avoid caffeine. Drink fewer fluids, especially alcohol. Avoid drinking in the evening. Avoid foods or drinks that may irritate the bladder. These include coffee, tea, soda, artificial sweeteners, citrus, tomato-based foods, and chocolate. Eat foods that help prevent or treat constipation. Constipation can make urinary frequency worse. You may need to take these actions to prevent or treat constipation: Drink enough fluid to keep your urine pale yellow. Take over-the-counter or prescription medicines. Eat foods that are high in fiber, such as beans, whole  grains, and fresh fruits and vegetables. Limit foods that are high in fat and processed sugars, such as fried or sweet foods. General instructions Take over-the-counter and prescription medicines only as told by your health care provider. Keep all follow-up visits. This is important. Contact a health care provider if: You start urinating more often. You feel pain or irritation when you urinate. You notice blood in your urine. Your urine looks cloudy. You develop a fever. You begin vomiting. Get help right away if: You are unable to urinate. Summary Urinary frequency means urinating more often than usual. With urinary frequency, you may urinate every 1-2 hours even though you drink a normal amount of fluid and do not have a bladder infection or other bladder condition. Your health care provider may recommend that you keep a bladder diary, follow a bladder training program, or make dietary changes. If told by your health care provider, do Kegel exercises to strengthen the muscles that help control urination. Take over-the-counter and prescription medicines only as told by your health care provider. Contact a health care provider if your symptoms do not improve or get worse. This information is not intended to replace advice given to you by your health care provider. Make sure you discuss any questions you have with your health care provider. Document Revised: 11/01/2019 Document Reviewed: 11/01/2019 Elsevier Patient Education  [place].

## 2021-04-29 NOTE — Assessment & Plan Note (Signed)
Tolerating statin, encouraged heart healthy diet, avoid trans fats, minimize simple carbs and saturated fats. Increase exercise as tolerated 

## 2021-04-29 NOTE — Assessment & Plan Note (Signed)
Per endo °

## 2021-04-29 NOTE — Assessment & Plan Note (Signed)
Poorly controlled will alter medications, encouraged DASH diet, minimize caffeine and obtain adequate sleep. Report concerning symptoms and follow up as directed and as needed 

## 2021-04-30 LAB — COMPREHENSIVE METABOLIC PANEL
ALT: 11 U/L (ref 0–53)
AST: 12 U/L (ref 0–37)
Albumin: 3.8 g/dL (ref 3.5–5.2)
Alkaline Phosphatase: 67 U/L (ref 39–117)
BUN: 14 mg/dL (ref 6–23)
CO2: 31 mEq/L (ref 19–32)
Calcium: 9 mg/dL (ref 8.4–10.5)
Chloride: 103 mEq/L (ref 96–112)
Creatinine, Ser: 1.25 mg/dL (ref 0.40–1.50)
GFR: 52.83 mL/min — ABNORMAL LOW (ref >60.00)
Glucose, Bld: 113 mg/dL — ABNORMAL HIGH (ref 70–99)
Potassium: 4.4 mEq/L (ref 3.5–5.1)
Sodium: 141 mEq/L (ref 135–145)
Total Bilirubin: 0.4 mg/dL (ref 0.2–1.2)
Total Protein: 6.1 g/dL (ref 6.0–8.3)

## 2021-04-30 LAB — MICROALBUMIN / CREATININE URINE RATIO
Creatinine,U: 113.4 mg/dL
Microalb Creat Ratio: 0.6 mg/g (ref 0.0–30.0)
Microalb, Ur: <0.7 mg/dL (ref 0.0–1.9)

## 2021-04-30 LAB — LIPID PANEL
Cholesterol: 186 mg/dL (ref 0–200)
HDL: 47.4 mg/dL (ref >39.00)
LDL Cholesterol: 111 mg/dL — ABNORMAL HIGH (ref 0–99)
NonHDL: 138.12
Total CHOL/HDL Ratio: 4
Triglycerides: 135 mg/dL (ref 0.0–149.0)
VLDL: 27 mg/dL (ref 0.0–40.0)

## 2021-04-30 LAB — PSA: PSA: 1.71 ng/mL (ref 0.10–4.00)

## 2021-05-05 DIAGNOSIS — R972 Elevated prostate specific antigen [PSA]: Secondary | ICD-10-CM | POA: Diagnosis not present

## 2021-05-05 DIAGNOSIS — R351 Nocturia: Secondary | ICD-10-CM | POA: Diagnosis not present

## 2021-05-05 DIAGNOSIS — N401 Enlarged prostate with lower urinary tract symptoms: Secondary | ICD-10-CM | POA: Diagnosis not present

## 2021-05-05 DIAGNOSIS — C61 Malignant neoplasm of prostate: Secondary | ICD-10-CM | POA: Diagnosis not present

## 2021-06-27 ENCOUNTER — Other Ambulatory Visit: Payer: Self-pay | Admitting: Family Medicine

## 2021-08-04 DIAGNOSIS — R972 Elevated prostate specific antigen [PSA]: Secondary | ICD-10-CM | POA: Diagnosis not present

## 2021-08-11 DIAGNOSIS — C61 Malignant neoplasm of prostate: Secondary | ICD-10-CM | POA: Diagnosis not present

## 2021-08-11 DIAGNOSIS — N401 Enlarged prostate with lower urinary tract symptoms: Secondary | ICD-10-CM | POA: Diagnosis not present

## 2021-08-11 DIAGNOSIS — R351 Nocturia: Secondary | ICD-10-CM | POA: Diagnosis not present

## 2021-08-24 ENCOUNTER — Ambulatory Visit: Payer: Medicare HMO | Admitting: Endocrinology

## 2021-10-04 ENCOUNTER — Other Ambulatory Visit: Payer: Self-pay | Admitting: Family Medicine

## 2021-10-04 DIAGNOSIS — E1149 Type 2 diabetes mellitus with other diabetic neurological complication: Secondary | ICD-10-CM

## 2021-10-04 DIAGNOSIS — I10 Essential (primary) hypertension: Secondary | ICD-10-CM

## 2021-10-20 NOTE — Progress Notes (Unsigned)
Name: [name] [name]  Age/ Sex: 87 y.o., male   MRN/ DOB: [id number], 1934-08-30     PCP: Ann Held, DO   Reason for Endocrinology Evaluation: Type 2 Diabetes Mellitus  Initial Endocrine Consultative Visit: 06/03/2019    PATIENT IDENTIFIER: [name] is a 87 y.o. male with a past medical history of T2DM, HTN, Hx prostate ca. The patient has followed with Endocrinology clinic since 06/03/2019 for consultative assistance with management of his diabetes.  DIABETIC HISTORY:  [name] [name] was diagnosed with DM 1985. Rybelsus was cost prohibitive. His hemoglobin A1c has ranged from 5.4% in 2021, peaking at 10.3% in 2021.   SUBJECTIVE:   During the last visit (04/22/2021): A1c 7.0%  Today (10/21/2021): [name] [name]  is here for a follow up on diabetes management. He is accompanied by his daughter Cindi. He checks his blood sugars 2 times daily. The patient has  had hypoglycemic episodes since the last clinic visit, he is not symptomatic .   Denies nausea, vomiting or diarrhea    HOME DIABETES REGIMEN:  Metformin 500 mg XR BID Repaglinide 1 mg with breakfast time     Statin: yes ACE-I/ARB: yes    METER DOWNLOAD SUMMARY: did not bring    DIABETIC COMPLICATIONS: Microvascular complications:   Denies: CKD, neuropathy , retinopathy Last Eye Exam: Completed 11/2021  Macrovascular complications:   Denies: CAD, CVA, PVD   HISTORY:  Past Medical History:  Past Medical History:  Diagnosis Date   Diabetes mellitus 1955   type 2 - fasting 100-120   Diverticulosis 2009   GERD (gastroesophageal reflux disease)    Glaucoma    bilateral   History of shingles    Hyperlipidemia    5 yrs   Hypertension    Neuromuscular disorder ([place])    due to shingles around abdomen area    Osteoarthritis    "knees" (01/31/2014)   Prostate cancer ([place])    "no treatments; it's going away by itself w/the pills I take"   Past Surgical History:  Past Surgical History:  Procedure  Laterality Date   ANTERIOR CERVICAL DECOMP/DISCECTOMY FUSION N/A 09/03/2015   Procedure: ANTERIOR CERVICAL DECOMPRESSION FUSION, CERVICAL 5-6 WITH INSTRUMENTATION AND ALLOGRAFT;  Surgeon: Phylliss Bob, MD;  Location: [facility];  Service: Orthopedics;  Laterality: N/A;  ANTERIOR CERVICAL DECOMPRESSION FUSION, CERVICAL 5-6 WITH INSTRUMENTATION AND ALLOGRAFT   COLONOSCOPY     FINE NEEDLE ASPIRATION Left 06/10/15   Knee   FINE NEEDLE ASPIRATION  05/26/15   Back   JOINT REPLACEMENT     TONSILLECTOMY     TOTAL KNEE ARTHROPLASTY Right 01/31/2014   TOTAL KNEE ARTHROPLASTY Right 01/31/2014   Procedure: RIGHT TOTAL KNEE ARTHROPLASTY;  Surgeon: Kerin Salen, MD;  Location: [facility];  Service: Orthopedics;  Laterality: Right;   Social History:  reports that he quit smoking about 26 years ago. His smoking use included cigars. He has never used smokeless tobacco. He reports that he does not drink alcohol and does not use drugs. Family History:  Family History  Problem Relation Age of Onset   Arthritis Other    Diabetes Other    Cancer Other        prostate   Heart disease Brother        questionable   Colon cancer Daughter 59   Diabetes Mother    Colon polyps Neg Hx    Esophageal cancer Neg Hx    Rectal cancer Neg Hx    Stomach cancer Neg Hx  HOME MEDICATIONS: Allergies as of 10/21/2021   No Known Allergies      Medication List        Accurate as of October 21, 2021  1:21 PM. If you have any questions, ask your nurse or doctor.          Accu-Chek Aviva Plus test strip Generic drug: glucose blood 1 each by Other route daily. And lancets 1/day   True Metrix Blood Glucose Test test strip Generic drug: glucose blood USE AS INSTRUCTED   acetaminophen 500 MG tablet Commonly known as: TYLENOL Take 500 mg by mouth every 6 (six) hours as needed.   aspirin 81 MG chewable tablet Chew by mouth daily.   atorvastatin 20 MG tablet Commonly known as: LIPITOR TAKE 1 TABLET EVERY DAY   b  complex vitamins tablet Take 1 tablet by mouth daily.   cholecalciferol 25 MCG (1000 UNIT) tablet Commonly known as: VITAMIN D3 Take 1,000 Units by mouth daily.   CO Q 10 PO Take 50 mg by mouth daily.   finasteride 5 MG tablet Commonly known as: PROSCAR Take 5 mg by mouth daily.   furosemide 20 MG tablet Commonly known as: LASIX Take 1 tablet (20 mg total) by mouth every other day.   gabapentin 600 MG tablet Commonly known as: NEURONTIN TAKE 1 TABLET THREE TIMES DAILY   latanoprost 0.005 % ophthalmic solution Commonly known as: XALATAN Place 1 drop into both eyes at bedtime.   metFORMIN 500 MG 24 hr tablet Commonly known as: GLUCOPHAGE-XR Take 1 tablet (500 mg total) by mouth daily with breakfast.   mirtazapine 15 MG tablet Commonly known as: REMERON TAKE 1 TABLET BY MOUTH EVERYDAY AT BEDTIME   omeprazole 20 MG capsule Commonly known as: PRILOSEC TAKE 1 CAPSULE EVERY DAY   ramipril 5 MG capsule Commonly known as: ALTACE Take 1 capsule (5 mg total) by mouth daily.   repaglinide 1 MG tablet Commonly known as: PRANDIN Take 1 tablet (1 mg total) by mouth daily with breakfast.   tamsulosin 0.4 MG Caps capsule Commonly known as: FLOMAX Take 0.4 mg by mouth daily.   traMADol 50 MG tablet Commonly known as: ULTRAM Take 1 tablet by mouth daily.   TRUEplus Lancets 33G Misc USE AS DIRECTED TO CHECK BLOOD SUGAR UP TO TWO TIMES A DAY.   vitamin C 1000 MG tablet Take 1,000 mg by mouth daily.         OBJECTIVE:   Vital Signs: BP 136/80 (BP Location: Left Arm, Patient Position: Sitting, Cuff Size: Small)   Pulse 68   Ht '5\' 5"'$  (1.651 m)   Wt 154 lb 6.4 oz (70 kg)   SpO2 94%   BMI 25.69 kg/m   Wt Readings from Last 3 Encounters:  10/21/21 154 lb 6.4 oz (70 kg)  04/29/21 167 lb 3.2 oz (75.8 kg)  04/22/21 165 lb 9.6 oz (75.1 kg)     Exam: General: Pt appears well and is in NAD  Neck: General: Supple without adenopathy. Thyroid: Thyroid size normal.  No  goiter or nodules appreciated.  Lungs: Clear with good BS bilat with no rales, rhonchi, or wheezes  Heart: RRR with normal S1 and S2 and no gallops; no murmurs; no rub  Extremities: No pretibial edema.   Neuro: MS is good with appropriate affect, pt is alert and Ox3    DM foot exam: 10/21/2021  The skin of the feet is intact without sores or ulcerations. The pedal pulses are 2+ on right  and 2+ on left. The sensation is decreased  to a screening 5.07, 10 gram monofilament bilaterally at the toes           DATA REVIEWED:  Lab Results  Component Value Date   HGBA1C 6.5 (A) 10/21/2021   HGBA1C 7.0 (A) 04/22/2021   HGBA1C 6.0 (A) 01/19/2021   Lab Results  Component Value Date   MICROALBUR <0.7 04/29/2021   LDLCALC 111 (H) 04/29/2021   CREATININE 1.25 04/29/2021   Lab Results  Component Value Date   MICRALBCREAT 0.6 04/29/2021     Lab Results  Component Value Date   CHOL 186 04/29/2021   HDL 47.40 04/29/2021   LDLCALC 111 (H) 04/29/2021   TRIG 135.0 04/29/2021   CHOLHDL 4 04/29/2021         ASSESSMENT / PLAN / RECOMMENDATIONS:   1) Type 2 Diabetes Mellitus, optimally controlled, With neuropathic  complications - Most recent A1c of 6.5%. Goal A1c < 8.0 %.     -His A1c seems to be optimal, but I am concerned about hypoglycemic episode which he does endorse some in the 50s mg/DL -I have asked the patient to bring his glucose meter on his next visit -He takes repaglinide 1 mg before breakfast which is his biggest meal of the day, we will reduce this as below -No changes to his metformin dose  MEDICATIONS: Continue metformin 500 mg XR twice daily Decrease repaglinide 0.5 mg, 1 tablet before breakfast  EDUCATION / INSTRUCTIONS: BG monitoring instructions: Patient is instructed to check his blood sugars 1 times a day, fasting . Call [facility] if: BG persistently < 70 I reviewed the Rule of 15 for the treatment of hypoglycemia in detail with the  patient. Literature supplied.   2) Diabetic complications:  Eye: Does not have known diabetic retinopathy.  Neuro/ Feet: Does  have known diabetic peripheral neuropathy .  Renal: Patient does not have known baseline CKD. He   is  on an ACEI/ARB at present.       F/U in 6 months     Signed electronically by: Mack Guise, MD  [facility]  [facility] [address] [place], [place] [address] Phone: [phone] FAX: [phone]   CC: [address] [place] [address] Phone: [phone]  Fax: [phone]  Return to Endocrinology clinic as below: Future Appointments  Date Time Provider [place]  10/22/2021  9:40 AM [facility]

## 2021-10-21 ENCOUNTER — Ambulatory Visit (INDEPENDENT_AMBULATORY_CARE_PROVIDER_SITE_OTHER): Payer: Medicare HMO | Admitting: Internal Medicine

## 2021-10-21 ENCOUNTER — Encounter: Payer: Self-pay | Admitting: Internal Medicine

## 2021-10-21 ENCOUNTER — Ambulatory Visit: Payer: Medicare HMO

## 2021-10-21 VITALS — BP 136/80 | HR 68 | Ht 65.0 in | Wt 154.4 lb

## 2021-10-21 DIAGNOSIS — E1165 Type 2 diabetes mellitus with hyperglycemia: Secondary | ICD-10-CM | POA: Diagnosis not present

## 2021-10-21 DIAGNOSIS — E1142 Type 2 diabetes mellitus with diabetic polyneuropathy: Secondary | ICD-10-CM

## 2021-10-21 LAB — POCT GLYCOSYLATED HEMOGLOBIN (HGB A1C): Hemoglobin A1C: 6.5 % — AB (ref 4.0–5.6)

## 2021-10-21 LAB — POCT GLUCOSE (DEVICE FOR HOME USE): POC Glucose: 116 mg/dl — AB (ref 70–99)

## 2021-10-21 MED ORDER — METFORMIN HCL ER 500 MG PO TB24
500.0000 mg | ORAL_TABLET | Freq: Two times a day (BID) | ORAL | 3 refills | Status: DC
Start: 2021-10-21 — End: 2022-04-25

## 2021-10-21 MED ORDER — REPAGLINIDE 0.5 MG PO TABS
0.5000 mg | ORAL_TABLET | Freq: Every day | ORAL | 3 refills | Status: DC
Start: 2021-10-21 — End: 2022-04-22

## 2021-10-21 NOTE — Patient Instructions (Addendum)
-   Continue Metformin 500 mg 1 tablet twice a day  - Decrease Repaglinide to 0.5 mg , 1 tablet before breakfast       HOW TO TREAT LOW BLOOD SUGARS (Blood sugar LESS THAN 70 MG/DL) Please follow the RULE OF 15 for the treatment of hypoglycemia treatment (when your (blood sugars are less than 70 mg/dL)   STEP 1: Take 15 grams of carbohydrates when your blood sugar is low, which includes:  3-4 GLUCOSE TABS  OR 3-4 OZ OF JUICE OR REGULAR SODA OR ONE TUBE OF GLUCOSE GEL    STEP 2: RECHECK blood sugar in 15 MINUTES STEP 3: If your blood sugar is still low at the 15 minute recheck --> then, go back to STEP 1 and treat AGAIN with another 15 grams of carbohydrates.

## 2021-10-22 ENCOUNTER — Ambulatory Visit (INDEPENDENT_AMBULATORY_CARE_PROVIDER_SITE_OTHER): Payer: Medicare HMO

## 2021-10-22 VITALS — BP 156/56 | HR 58 | Temp 98.2°F | Resp 16 | Ht 65.0 in | Wt 153.8 lb

## 2021-10-22 DIAGNOSIS — Z0000 Encounter for general adult medical examination without abnormal findings: Secondary | ICD-10-CM | POA: Diagnosis not present

## 2021-10-22 NOTE — Progress Notes (Signed)
Subjective:   Ayomikun Starling is a 87 y.o. male who presents for Medicare Annual/Subsequent preventive examination.  Review of Systems     Cardiac Risk Factors include: advanced age (>40mn, >>110women);male gender;hypertension;diabetes mellitus;dyslipidemia    Objective:    Today's Vitals   10/22/21 0942  BP: (!) 156/56  Pulse: (!) 58  Resp: 16  Temp: 98.2 F (36.8 C)  SpO2: 98%  Weight: 153 lb 12.8 oz (69.8 kg)  Height: '5\' 5"'$  (1.651 m)   Body mass index is 25.59 kg/m.     10/22/2021    9:40 AM 10/19/2020   10:35 AM 09/02/2019   11:39 AM 06/27/2019   11:03 AM 06/18/2018    8:31 AM 06/13/2016    8:53 AM 09/03/2015    8:00 PM  Advanced Directives  Does Patient Have a Medical Advance Directive? Yes Yes Yes Yes Yes Yes Yes  Type of AParamedicof AAnton ChicoLiving will [place] will Living will  Living will;Healthcare Power of [place]boroLiving will HDix HillsLiving will  Does patient want to make changes to medical advance directive? No - Patient declined  No - Patient declined   No - Patient declined No - Patient declined  Copy of [place] Chart? No - copy requested No - copy requested    No - copy requested No - copy requested    Current Medications (verified) Outpatient Encounter Medications as of 10/22/2021  Medication Sig   acetaminophen (TYLENOL) 500 MG tablet Take 500 mg by mouth every 6 (six) hours as needed.   Ascorbic Acid (VITAMIN C) 1000 MG tablet Take 1,000 mg by mouth daily.   aspirin 81 MG chewable tablet Chew by mouth daily.   atorvastatin (LIPITOR) 20 MG tablet TAKE 1 TABLET EVERY DAY   b complex vitamins tablet Take 1 tablet by mouth daily.   cholecalciferol (VITAMIN D3) 25 MCG (1000 UNIT) tablet Take 1,000 Units by mouth daily.   Coenzyme Q10 (CO Q 10 PO) Take 50 mg by mouth daily.   finasteride (PROSCAR) 5 MG tablet Take 5 mg by mouth daily.    furosemide (LASIX) 20 MG tablet Take 1 tablet (20 mg total) by mouth every other day.   gabapentin (NEURONTIN) 600 MG tablet TAKE 1 TABLET THREE TIMES DAILY   glucose blood (ACCU-CHEK AVIVA PLUS) test strip 1 each by Other route daily. And lancets 1/day   latanoprost (XALATAN) 0.005 % ophthalmic solution Place 1 drop into both eyes at bedtime.   metFORMIN (GLUCOPHAGE-XR) 500 MG 24 hr tablet Take 1 tablet (500 mg total) by mouth in the morning and at bedtime.   mirtazapine (REMERON) 15 MG tablet TAKE 1 TABLET BY MOUTH EVERYDAY AT BEDTIME   omeprazole (PRILOSEC) 20 MG capsule TAKE 1 CAPSULE EVERY DAY   ramipril (ALTACE) 5 MG capsule Take 1 capsule (5 mg total) by mouth daily.   repaglinide (PRANDIN) 0.5 MG tablet Take 1 tablet (0.5 mg total) by mouth daily before breakfast.   tamsulosin (FLOMAX) 0.4 MG CAPS capsule Take 0.4 mg by mouth daily.   traMADol (ULTRAM) 50 MG tablet Take 1 tablet by mouth daily.   TRUE METRIX BLOOD GLUCOSE TEST test strip USE AS INSTRUCTED   TRUEplus Lancets 33G MISC USE AS DIRECTED TO CHECK BLOOD SUGAR UP TO TWO TIMES A DAY.   No facility-administered encounter medications on file as of 10/22/2021.    Allergies (verified) Patient has no known allergies.  History: Past Medical History:  Diagnosis Date   Diabetes mellitus 1955   type 2 - fasting 100-120   Diverticulosis 2009   GERD (gastroesophageal reflux disease)    Glaucoma    bilateral   History of shingles    Hyperlipidemia    5 yrs   Hypertension    Neuromuscular disorder ([place])    due to shingles around abdomen area    Osteoarthritis    "knees" (01/31/2014)   Prostate cancer ([place])    "no treatments; it's going away by itself w/the pills I take"   Past Surgical History:  Procedure Laterality Date   ANTERIOR CERVICAL DECOMP/DISCECTOMY FUSION N/A 09/03/2015   Procedure: ANTERIOR CERVICAL DECOMPRESSION FUSION, CERVICAL 5-6 WITH INSTRUMENTATION AND ALLOGRAFT;  Surgeon: Phylliss Bob, MD;  Location: [facility];  Service: Orthopedics;  Laterality: N/A;  ANTERIOR CERVICAL DECOMPRESSION FUSION, CERVICAL 5-6 WITH INSTRUMENTATION AND ALLOGRAFT   COLONOSCOPY     FINE NEEDLE ASPIRATION Left 06/10/15   Knee   FINE NEEDLE ASPIRATION  05/26/15   Back   JOINT REPLACEMENT     TONSILLECTOMY     TOTAL KNEE ARTHROPLASTY Right 01/31/2014   TOTAL KNEE ARTHROPLASTY Right 01/31/2014   Procedure: RIGHT TOTAL KNEE ARTHROPLASTY;  Surgeon: Kerin Salen, MD;  Location: [facility];  Service: Orthopedics;  Laterality: Right;   Family History  Problem Relation Age of Onset   Arthritis Other    Diabetes Other    Cancer Other        prostate   Heart disease Brother        questionable   Colon cancer Daughter 49   Diabetes Mother    Colon polyps Neg Hx    Esophageal cancer Neg Hx    Rectal cancer Neg Hx    Stomach cancer Neg Hx    Social History   Socioeconomic History   Marital status: Widowed    Spouse name: Not on file   Number of children: Not on file   Years of education: Not on file   Highest education level: Not on file  Occupational History    Comment: retired Biomedical scientist     Comment: Restaurant manager, fast food  Tobacco Use   Smoking status: Former    Types: Cigars    Quit date: 04/12/1995    Years since quitting: 26.5   Smokeless tobacco: Never   Tobacco comments:    chews cigars still   Vaping Use   Vaping Use: Never used  Substance and Sexual Activity   Alcohol use: No   Drug use: No   Sexual activity: Never  Other Topics Concern   Not on file  Social History Narrative   Works out in the garden   Social Determinants of Radio broadcast assistant Strain: [place]  (10/19/2020)   Overall Financial Resource Strain (CARDIA)    Difficulty of Paying Living Expenses: Not hard at all  Food Insecurity: No [place] (10/19/2020)   Hunger Vital Sign    Worried About Running Out of Food in the Last Year: Never true    [place] in the Last Year: Never true  Transportation Needs: No Transportation  Needs (10/19/2020)   PRAPARE - Hydrologist (Medical): No    Lack of Transportation (Non-Medical): No  Physical Activity: Inactive (10/19/2020)   Exercise Vital Sign    Days of Exercise per Week: 0 days    Minutes of Exercise per Session: 0 min  Stress: No Stress  Concern Present (10/19/2020)   [place]    Feeling of Stress : Not at all  Social Connections: Moderately Isolated (10/19/2020)   Social Connection and Isolation Panel [NHANES]    Frequency of Communication with Friends and Family: More than three times a week    Frequency of Social Gatherings with Friends and Family: More than three times a week    Attends Religious Services: More than 4 times per year    Active Member of [employer] or Organizations: No    Attends Archivist Meetings: Never    Marital Status: Widowed    Tobacco Counseling Counseling given: Not Answered Tobacco comments: chews cigars still    Clinical Intake:  Pre-visit preparation completed: Yes  Pain : No/denies pain     BMI - recorded: 25.59 Nutritional Status: BMI 25 -29 Overweight Nutritional Risks: None Diabetes: Yes CBG done?: No Did pt. bring in CBG monitor from home?: No  How often do you need to have someone help you when you read instructions, pamphlets, or other written materials from your doctor or pharmacy?: 1 - Never  Diabetic?yes Nutrition Risk Assessment:  Has the patient had any N/V/D within the last 2 months?  No  Does the patient have any non-healing wounds?  No  Has the patient had any unintentional weight loss or weight gain?  No   Diabetes:  Is the patient diabetic?  Yes  If diabetic, was a CBG obtained today?  No  Did the patient bring in their glucometer from home?  No  How often do you monitor your CBG's? Twice daily.   Financial Strains and Diabetes Management:  Are you having any financial strains with the  device, your supplies or your medication? No .  Does the patient want to be seen by Chronic Care Management for management of their diabetes?  No  Would the patient like to be referred to a Nutritionist or for Diabetic Management?  No   Diabetic Exams:  Diabetic Eye Exam: Overdue for diabetic eye exam. Pt has been advised about the importance in completing this exam. Patient advised to call and schedule an eye exam. Diabetic Foot Exam: Completed 01/19/21    Interpreter Needed?: No  Information entered by :: [facility] of Daily Living    10/22/2021    9:45 AM  In your present state of health, do you have any difficulty performing the following activities:  Hearing? 0  Vision? 0  Difficulty concentrating or making decisions? 0  Walking or climbing stairs? 0  Dressing or bathing? 0  Doing errands, shopping? 0  Preparing Food and eating ? N  Using the Toilet? N  In the past six months, have you accidently leaked urine? N  Do you have problems with loss of bowel control? Y  Managing your Medications? N  Managing your Finances? N  Housekeeping or managing your Housekeeping? N    Patient Care Team: Carollee Herter, Alferd Apa, DO as PCP - General (Family Medicine) Festus Aloe, MD as Consulting Physician (Urology) Marylynn Pearson, MD as Consulting Physician (Ophthalmology) Frederik Pear, MD as Consulting Physician (Orthopedic Surgery) Neldon Newport as Physician Assistant (Orthopedic Surgery) Normajean Glasgow, MD as Attending Physician (Physical Medicine and Rehabilitation) Phylliss Bob, MD as Consulting Physician (Orthopedic Surgery) Renato Shin, MD (Inactive) as Consulting Physician (Endocrinology)  Indicate any recent Medical Services you may have received from other than [facility] providers in the past year (date may  be approximate).     Assessment:   This is a routine wellness examination for [employer].  Hearing/Vision screen No results found.  Dietary  issues and exercise activities discussed: Current Exercise Habits: Home exercise routine, Type of exercise: walking, Time (Minutes): 30, Frequency (Times/Week): 7, Weekly Exercise (Minutes/Week): 210, Intensity: Mild, Exercise limited by: None identified   Goals Addressed             This Visit's Progress    Maintain current health   On track      Depression Screen    10/22/2021    9:41 AM 10/19/2020   10:40 AM 09/02/2019   11:43 AM 06/27/2019   11:03 AM 05/31/2018   11:31 AM 12/26/2016   11:33 AM 06/23/2015    3:26 PM  PHQ 2/9 Scores  PHQ - 2 Score 0 0 0 0 0 0 0  PHQ- 9 Score     0      Fall Risk    10/22/2021    9:40 AM 10/19/2020   10:37 AM 09/02/2019   11:42 AM 06/27/2019   11:03 AM 02/27/2018    3:07 PM  Fall Risk   Falls in the past year? 0 0 0 0 0  Comment     Emmi Telephone Survey: data to providers prior to load  Number falls in past yr: 0 0 0    Injury with Fall? 0 0 0    Risk for fall due to : History of fall(s)      Follow up Falls evaluation completed Falls prevention discussed Education provided;Falls prevention discussed      FALL RISK PREVENTION PERTAINING TO THE HOME:  Any stairs in or around the home? No  If so, are there any without handrails?  N/a Home free of loose throw rugs in walkways, pet beds, electrical cords, etc? Yes  Adequate lighting in your home to reduce risk of falls? Yes   ASSISTIVE DEVICES UTILIZED TO PREVENT FALLS:  Life alert? Yes  Use of a cane, walker or w/c? No  Grab bars in the bathroom? Yes  Shower chair or bench in shower? Yes  Elevated toilet seat or a handicapped toilet? No   TIMED UP AND GO:  Was the test performed? Yes .  Length of time to ambulate 10 feet: 10 sec.   Gait steady and fast without use of assistive device  Cognitive Function:    06/13/2016    8:55 AM  MMSE - Mini Mental State Exam  Orientation to time 5  Orientation to Place 5  Registration 3  Attention/ Calculation 5  Recall 3  Language-  name 2 objects 2  Language- repeat 1  Language- follow 3 step command 3  Language- read & follow direction 1  Write a sentence 1  Copy design 1  Total score 30        10/22/2021    9:53 AM  6CIT Screen  What Year? 0 points  What month? 0 points  What time? 0 points  Count back from 20 0 points  Months in reverse 4 points  Repeat phrase 6 points  Total Score 10 points    Immunizations Immunization History  Administered Date(s) Administered   Fluad Quad(high Dose 65+) 12/04/2018, 12/26/2019, 04/29/2021   Influenza Split 12/21/2011   Influenza Whole 12/19/2007, 01/16/2009, 01/27/2010   Influenza, High Dose Seasonal PF 12/15/2014, 12/24/2016, 02/21/2018   Influenza-Unspecified 01/01/2014, 12/24/2016   Moderna Sars-Covid-2 Vaccination 06/01/2019, 06/29/2019, 02/04/2020, 07/21/2020   Pneumococcal Conjugate-13 05/01/2014,  09/16/2014   Pneumococcal Polysaccharide-23 12/19/2007, 04/25/2019   Td 10/16/2007   Zoster Recombinat (Shingrix) 07/06/2016, 10/05/2016    TDAP status: Due, Education has been provided regarding the importance of this vaccine. Advised may receive this vaccine at local pharmacy or Health Dept. Aware to provide a copy of the vaccination record if obtained from local pharmacy or Health Dept. Verbalized acceptance and understanding.  Flu Vaccine status: Up to date  Pneumococcal vaccine status: Up to date  Covid-19 vaccine status: Information provided on how to obtain vaccines.   Qualifies for Shingles Vaccine? Yes   Zostavax completed No   Shingrix Completed?: Yes  Screening Tests Health Maintenance  Topic Date Due   TETANUS/TDAP  10/15/2017   COVID-19 Vaccine (5 - Booster for Moderna series) 09/15/2020   OPHTHALMOLOGY EXAM  10/27/2020   INFLUENZA VACCINE  11/09/2021   FOOT EXAM  01/19/2022   HEMOGLOBIN A1C  04/23/2022   Pneumonia Vaccine 45+ Years old  Completed   Zoster Vaccines- Shingrix  Completed   HPV VACCINES  Aged Out    Health  Maintenance  Health Maintenance Due  Topic Date Due   TETANUS/TDAP  10/15/2017   COVID-19 Vaccine (5 - Booster for Moderna series) 09/15/2020   OPHTHALMOLOGY EXAM  10/27/2020    Colorectal cancer screening: No longer required.   Lung Cancer Screening: (Low Dose CT Chest recommended if Age 30-80 years, 30 pack-year currently smoking OR have quit w/in 15years.) does not qualify.   Lung Cancer Screening Referral: n/a  Additional Screening:  Hepatitis C Screening: does not qualify; Completed aged out  Vision Screening: Recommended annual ophthalmology exams for early detection of glaucoma and other disorders of the eye. Is the patient up to date with their annual eye exam?  Yes  Who is the provider or what is the name of the office in which the patient attends annual eye exams? Dr. Percell Miller If pt is not established with a provider, would they like to be referred to a provider to establish care? No .   Dental Screening: Recommended annual dental exams for proper oral hygiene  Community Resource Referral / Chronic Care Management: CRR required this visit?  No   CCM required this visit?  No      Plan:     I have personally reviewed and noted the following in the patient's chart:   Medical and social history Use of alcohol, tobacco or illicit drugs  Current medications and supplements including opioid prescriptions. Patient is currently taking opioid prescriptions. Information provided to patient regarding non-opioid alternatives. Patient advised to discuss non-opioid treatment plan with their provider. Functional ability and status Nutritional status Physical activity Advanced directives List of other physicians Hospitalizations, surgeries, and ER visits in previous 12 months Vitals Screenings to include cognitive, depression, and falls Referrals and appointments  In addition, I have reviewed and discussed with patient certain preventive protocols, quality metrics, and best  practice recommendations. A written personalized care plan for preventive services as well as general preventive health recommendations were provided to patient.     Duard Brady Nemesio Castrillon, [place]   10/22/2021   Nurse Notes: none

## 2021-10-22 NOTE — Patient Instructions (Signed)
[name] [name] , Thank you for taking time to come for your Medicare Wellness Visit. I appreciate your ongoing commitment to your health goals. Please review the following plan we discussed and let me know if I can assist you in the future.   Screening recommendations/referrals: Colonoscopy: no longer needed Recommended yearly ophthalmology/optometry visit for glaucoma screening and checkup Recommended yearly dental visit for hygiene and checkup  Vaccinations: Influenza vaccine: up to date Pneumococcal vaccine: up to date Tdap vaccine: Due-May obtain vaccine at our office or your local pharmacy.  Shingles vaccine: up to date   Covid-19: Due-May obtain vaccine at our office or your local pharmacy.   Advanced directives: yes , not on file  Conditions/risks identified: see problem list   Next appointment: Follow up in one year for your annual wellness visit. 10/26/22  Preventive Care 65 Years and Older, Male Preventive care refers to lifestyle choices and visits with your health care provider that can promote health and wellness. What does preventive care include? A yearly physical exam. This is also called an annual well check. Dental exams once or twice a year. Routine eye exams. Ask your health care provider how often you should have your eyes checked. Personal lifestyle choices, including: Daily care of your teeth and gums. Regular physical activity. Eating a healthy diet. Avoiding tobacco and drug use. Limiting alcohol use. Practicing safe sex. Taking low doses of aspirin every day. Taking vitamin and mineral supplements as recommended by your health care provider. What happens during an annual well check? The services and screenings done by your health care provider during your annual well check will depend on your age, overall health, lifestyle risk factors, and family history of disease. Counseling  Your health care provider may ask you questions about your: Alcohol  use. Tobacco use. Drug use. Emotional well-being. Home and relationship well-being. Sexual activity. Eating habits. History of falls. Memory and ability to understand (cognition). Work and work Statistician. Screening  You may have the following tests or measurements: Height, weight, and BMI. Blood pressure. Lipid and cholesterol levels. These may be checked every 5 years, or more frequently if you are over 85 years old. Skin check. Lung cancer screening. You may have this screening every year starting at age 85 if you have a 30-pack-year history of smoking and currently smoke or have quit within the past 15 years. Fecal occult blood test (FOBT) of the stool. You may have this test every year starting at age 2. Flexible sigmoidoscopy or colonoscopy. You may have a sigmoidoscopy every 5 years or a colonoscopy every 10 years starting at age 85. Prostate cancer screening. Recommendations will vary depending on your family history and other risks. Hepatitis C blood test. Hepatitis B blood test. Sexually transmitted disease (STD) testing. Diabetes screening. This is done by checking your blood sugar (glucose) after you have not eaten for a while (fasting). You may have this done every 1-3 years. Abdominal aortic aneurysm (AAA) screening. You may need this if you are a current or former smoker. Osteoporosis. You may be screened starting at age 85 if you are at high risk. Talk with your health care provider about your test results, treatment options, and if necessary, the need for more tests. Vaccines  Your health care provider may recommend certain vaccines, such as: Influenza vaccine. This is recommended every year. Tetanus, diphtheria, and acellular pertussis (Tdap, Td) vaccine. You may need a Td booster every 10 years. Zoster vaccine. You may need this after age 85.  Pneumococcal 13-valent conjugate (PCV13) vaccine. One dose is recommended after age 85. Pneumococcal polysaccharide  (PPSV23) vaccine. One dose is recommended after age 85. Talk to your health care provider about which screenings and vaccines you need and how often you need them. This information is not intended to replace advice given to you by your health care provider. Make sure you discuss any questions you have with your health care provider. Document Released: 04/24/2015 Document Revised: 12/16/2015 Document Reviewed: 01/27/2015 Elsevier Interactive Patient Education  2017 [place] Prevention in the Home Falls can cause injuries. They can happen to people of all ages. There are many things you can do to make your home safe and to help prevent falls. What can I do on the outside of my home? Regularly fix the edges of walkways and driveways and fix any cracks. Remove anything that might make you trip as you walk through a door, such as a raised step or threshold. Trim any bushes or trees on the path to your home. Use bright outdoor lighting. Clear any walking paths of anything that might make someone trip, such as rocks or tools. Regularly check to see if handrails are loose or broken. Make sure that both sides of any steps have handrails. Any raised decks and porches should have guardrails on the edges. Have any leaves, snow, or ice cleared regularly. Use sand or salt on walking paths during winter. Clean up any spills in your garage right away. This includes oil or grease spills. What can I do in the bathroom? Use night lights. Install grab bars by the toilet and in the tub and shower. Do not use towel bars as grab bars. Use non-skid mats or decals in the tub or shower. If you need to sit down in the shower, use a plastic, non-slip stool. Keep the floor dry. Clean up any water that spills on the floor as soon as it happens. Remove soap buildup in the tub or shower regularly. Attach bath mats securely with double-sided non-slip rug tape. Do not have throw rugs and other things on the  floor that can make you trip. What can I do in the bedroom? Use night lights. Make sure that you have a light by your bed that is easy to reach. Do not use any sheets or blankets that are too big for your bed. They should not hang down onto the floor. Have a firm chair that has side arms. You can use this for support while you get dressed. Do not have throw rugs and other things on the floor that can make you trip. What can I do in the kitchen? Clean up any spills right away. Avoid walking on wet floors. Keep items that you use a lot in easy-to-reach places. If you need to reach something above you, use a strong step stool that has a grab bar. Keep electrical cords out of the way. Do not use floor polish or wax that makes floors slippery. If you must use wax, use non-skid floor wax. Do not have throw rugs and other things on the floor that can make you trip. What can I do with my stairs? Do not leave any items on the stairs. Make sure that there are handrails on both sides of the stairs and use them. Fix handrails that are broken or loose. Make sure that handrails are as long as the stairways. Check any carpeting to make sure that it is firmly attached to the stairs. Fix any  carpet that is loose or worn. Avoid having throw rugs at the top or bottom of the stairs. If you do have throw rugs, attach them to the floor with carpet tape. Make sure that you have a light switch at the top of the stairs and the bottom of the stairs. If you do not have them, ask someone to add them for you. What else can I do to help prevent falls? Wear shoes that: Do not have high heels. Have rubber bottoms. Are comfortable and fit you well. Are closed at the toe. Do not wear sandals. If you use a stepladder: Make sure that it is fully opened. Do not climb a closed stepladder. Make sure that both sides of the stepladder are locked into place. Ask someone to hold it for you, if possible. Clearly mark and make  sure that you can see: Any grab bars or handrails. First and last steps. Where the edge of each step is. Use tools that help you move around (mobility aids) if they are needed. These include: Canes. Walkers. Scooters. Crutches. Turn on the lights when you go into a dark area. Replace any light bulbs as soon as they burn out. Set up your furniture so you have a clear path. Avoid moving your furniture around. If any of your floors are uneven, fix them. If there are any pets around you, be aware of where they are. Review your medicines with your doctor. Some medicines can make you feel dizzy. This can increase your chance of falling. Ask your doctor what other things that you can do to help prevent falls. This information is not intended to replace advice given to you by your health care provider. Make sure you discuss any questions you have with your health care provider. Document Released: 01/22/2009 Document Revised: 09/03/2015 Document Reviewed: 05/02/2014 Elsevier Interactive Patient Education  2017 [employer].

## 2021-11-16 DIAGNOSIS — H2513 Age-related nuclear cataract, bilateral: Secondary | ICD-10-CM | POA: Diagnosis not present

## 2021-11-16 DIAGNOSIS — E119 Type 2 diabetes mellitus without complications: Secondary | ICD-10-CM | POA: Diagnosis not present

## 2021-11-16 DIAGNOSIS — H401131 Primary open-angle glaucoma, bilateral, mild stage: Secondary | ICD-10-CM | POA: Diagnosis not present

## 2022-01-04 ENCOUNTER — Telehealth: Payer: Self-pay | Admitting: Family Medicine

## 2022-01-04 NOTE — Telephone Encounter (Signed)
Pt called to schedule his flu shot and wanted to know if he is due for any other shots. Please advise, pt scheduled 9/29.

## 2022-01-05 NOTE — Telephone Encounter (Signed)
Pt called. LDVM 

## 2022-01-07 ENCOUNTER — Ambulatory Visit (INDEPENDENT_AMBULATORY_CARE_PROVIDER_SITE_OTHER): Payer: Medicare HMO

## 2022-01-07 DIAGNOSIS — Z23 Encounter for immunization: Secondary | ICD-10-CM

## 2022-01-07 NOTE — Progress Notes (Signed)
Patient here today for flu shot , given in left deltoid , patient tolerated immunization well

## 2022-02-17 ENCOUNTER — Other Ambulatory Visit: Payer: Self-pay | Admitting: Family Medicine

## 2022-02-17 DIAGNOSIS — I10 Essential (primary) hypertension: Secondary | ICD-10-CM

## 2022-03-22 DIAGNOSIS — H2513 Age-related nuclear cataract, bilateral: Secondary | ICD-10-CM | POA: Diagnosis not present

## 2022-03-22 DIAGNOSIS — H401131 Primary open-angle glaucoma, bilateral, mild stage: Secondary | ICD-10-CM | POA: Diagnosis not present

## 2022-03-22 DIAGNOSIS — E119 Type 2 diabetes mellitus without complications: Secondary | ICD-10-CM | POA: Diagnosis not present

## 2022-04-22 ENCOUNTER — Other Ambulatory Visit: Payer: Self-pay

## 2022-04-22 DIAGNOSIS — E1165 Type 2 diabetes mellitus with hyperglycemia: Secondary | ICD-10-CM

## 2022-04-22 MED ORDER — REPAGLINIDE 0.5 MG PO TABS: 0.5000 mg | ORAL_TABLET | Freq: Every day | ORAL | 3 refills | Status: DC

## 2022-04-25 ENCOUNTER — Other Ambulatory Visit: Payer: Self-pay

## 2022-04-25 ENCOUNTER — Ambulatory Visit (INDEPENDENT_AMBULATORY_CARE_PROVIDER_SITE_OTHER): Payer: Medicare HMO | Admitting: Internal Medicine

## 2022-04-25 ENCOUNTER — Encounter: Payer: Self-pay | Admitting: Internal Medicine

## 2022-04-25 VITALS — BP 130/70 | HR 76 | Ht 65.0 in | Wt 162.0 lb

## 2022-04-25 DIAGNOSIS — E1142 Type 2 diabetes mellitus with diabetic polyneuropathy: Secondary | ICD-10-CM

## 2022-04-25 LAB — POCT GLYCOSYLATED HEMOGLOBIN (HGB A1C): Hemoglobin A1C: 7.1 % — AB (ref 4.0–5.6)

## 2022-04-25 LAB — POCT GLUCOSE (DEVICE FOR HOME USE): POC Glucose: 103 mg/dl — AB (ref 70–99)

## 2022-04-25 MED ORDER — REPAGLINIDE 0.5 MG PO TABS
0.5000 mg | ORAL_TABLET | Freq: Every day | ORAL | 3 refills | Status: DC
Start: 2022-04-25 — End: 2022-04-25

## 2022-04-25 MED ORDER — METFORMIN HCL ER 500 MG PO TB24
500.0000 mg | ORAL_TABLET | Freq: Two times a day (BID) | ORAL | 3 refills | Status: DC
Start: 2022-04-25 — End: 2022-04-25

## 2022-04-25 MED ORDER — METFORMIN HCL ER 500 MG PO TB24
500.0000 mg | ORAL_TABLET | Freq: Two times a day (BID) | ORAL | 3 refills | Status: DC
Start: 2022-04-25 — End: 2022-05-30

## 2022-04-25 MED ORDER — REPAGLINIDE 0.5 MG PO TABS: 0.5000 mg | ORAL_TABLET | Freq: Every day | ORAL | 3 refills | Status: DC

## 2022-04-25 NOTE — Progress Notes (Signed)
Name: [name] [name]  Age/ Sex: 88 y.o., male   MRN/ DOB: [id number], 12-01-1934     PCP: Ann Held, DO   Reason for Endocrinology Evaluation: Type 2 Diabetes Mellitus  Initial Endocrine Consultative Visit: 06/03/2019    PATIENT IDENTIFIER: [name] [name] is a 88 y.o. male with a past medical history of T2DM, HTN, Hx prostate ca. The patient has followed with Endocrinology clinic since 06/03/2019 for consultative assistance with management of his diabetes.  DIABETIC HISTORY:  [name] [name] was diagnosed with DM 1985. Rybelsus was cost prohibitive. His hemoglobin A1c has ranged from 5.4% in 2021, peaking at 10.3% in 2021.   SUBJECTIVE:   During the last visit (10/21/2021): A1c 6.5%  Today (04/25/2022): [name] [name]  is here for a follow up on diabetes management. He checks his blood sugars 1-2 times daily. The patient has not had hypoglycemic episodes since the last clinic visit  Denies nausea, vomiting or diarrhea, except on christmas morning he had vomiting but his grand kids had gastroenteritis as well and where with him   HOME DIABETES REGIMEN:  Metformin 500 mg XR BID Repaglinide 0.5 mg before breakfast    Statin: yes ACE-I/ARB: yes    METER DOWNLOAD SUMMARY: did not bring       DIABETIC COMPLICATIONS: Microvascular complications:   Denies: CKD, neuropathy , retinopathy Last Eye Exam: Completed 11/16/2021  Macrovascular complications:   Denies: CAD, CVA, PVD   HISTORY:  Past Medical History:  Past Medical History:  Diagnosis Date   Diabetes mellitus 1955   type 2 - fasting 100-120   Diverticulosis 2009   GERD (gastroesophageal reflux disease)    Glaucoma    bilateral   History of shingles    Hyperlipidemia    5 yrs   Hypertension    Neuromuscular disorder ([place])    due to shingles around abdomen area    Osteoarthritis    "knees" (01/31/2014)   Prostate cancer ([place])    "no treatments; it's going away by itself w/the pills I take"   Past  Surgical History:  Past Surgical History:  Procedure Laterality Date   ANTERIOR CERVICAL DECOMP/DISCECTOMY FUSION N/A 09/03/2015   Procedure: ANTERIOR CERVICAL DECOMPRESSION FUSION, CERVICAL 5-6 WITH INSTRUMENTATION AND ALLOGRAFT;  Surgeon: Phylliss Bob, MD;  Location: [facility];  Service: Orthopedics;  Laterality: N/A;  ANTERIOR CERVICAL DECOMPRESSION FUSION, CERVICAL 5-6 WITH INSTRUMENTATION AND ALLOGRAFT   COLONOSCOPY     FINE NEEDLE ASPIRATION Left 06/10/15   Knee   FINE NEEDLE ASPIRATION  05/26/15   Back   JOINT REPLACEMENT     TONSILLECTOMY     TOTAL KNEE ARTHROPLASTY Right 01/31/2014   TOTAL KNEE ARTHROPLASTY Right 01/31/2014   Procedure: RIGHT TOTAL KNEE ARTHROPLASTY;  Surgeon: Kerin Salen, MD;  Location: [facility];  Service: Orthopedics;  Laterality: Right;   Social History:  reports that he quit smoking about 27 years ago. His smoking use included cigars. He has never used smokeless tobacco. He reports that he does not drink alcohol and does not use drugs. Family History:  Family History  Problem Relation Age of Onset   Arthritis Other    Diabetes Other    Cancer Other        prostate   Heart disease Brother        questionable   Colon cancer Daughter 68   Diabetes Mother    Colon polyps Neg Hx    Esophageal cancer Neg Hx    Rectal cancer Neg Hx  Stomach cancer Neg Hx      HOME MEDICATIONS: Allergies as of 04/25/2022   No Known Allergies      Medication List        Accurate as of April 25, 2022  7:48 AM. If you have any questions, ask your nurse or doctor.          Accu-Chek Aviva Plus test strip Generic drug: glucose blood 1 each by Other route daily. And lancets 1/day   True Metrix Blood Glucose Test test strip Generic drug: glucose blood USE AS INSTRUCTED   acetaminophen 500 MG tablet Commonly known as: TYLENOL Take 500 mg by mouth every 6 (six) hours as needed.   aspirin 81 MG chewable tablet Chew by mouth daily.   atorvastatin 20 MG  tablet Commonly known as: LIPITOR TAKE 1 TABLET EVERY DAY   b complex vitamins tablet Take 1 tablet by mouth daily.   cholecalciferol 25 MCG (1000 UNIT) tablet Commonly known as: VITAMIN D3 Take 1,000 Units by mouth daily.   CO Q 10 PO Take 50 mg by mouth daily.   finasteride 5 MG tablet Commonly known as: PROSCAR Take 5 mg by mouth daily.   furosemide 20 MG tablet Commonly known as: LASIX TAKE 1 TABLET EVERY OTHER DAY (NEED APPOINTMENT AND LABS)   gabapentin 600 MG tablet Commonly known as: NEURONTIN TAKE 1 TABLET THREE TIMES DAILY   latanoprost 0.005 % ophthalmic solution Commonly known as: XALATAN Place 1 drop into both eyes at bedtime.   metFORMIN 500 MG 24 hr tablet Commonly known as: GLUCOPHAGE-XR Take 1 tablet (500 mg total) by mouth in the morning and at bedtime.   mirtazapine 15 MG tablet Commonly known as: REMERON TAKE 1 TABLET BY MOUTH EVERYDAY AT BEDTIME   omeprazole 20 MG capsule Commonly known as: PRILOSEC TAKE 1 CAPSULE EVERY DAY   ramipril 5 MG capsule Commonly known as: ALTACE Take 1 capsule (5 mg total) by mouth daily.   repaglinide 0.5 MG tablet Commonly known as: PRANDIN Take 1 tablet (0.5 mg total) by mouth daily before breakfast.   tamsulosin 0.4 MG Caps capsule Commonly known as: FLOMAX Take 0.4 mg by mouth daily.   traMADol 50 MG tablet Commonly known as: ULTRAM Take 1 tablet by mouth daily.   TRUEplus Lancets 33G Misc USE AS DIRECTED TO CHECK BLOOD SUGAR UP TO TWO TIMES A DAY.   vitamin C 1000 MG tablet Take 1,000 mg by mouth daily.         OBJECTIVE:   Vital Signs: BP 130/70 (BP Location: Left Arm, Patient Position: Sitting, Cuff Size: Large)   Pulse 76   Ht '5\' 5"'$  (1.651 m)   Wt 162 lb (73.5 kg)   SpO2 98%   BMI 26.96 kg/m   Wt Readings from Last 3 Encounters:  04/25/22 162 lb (73.5 kg)  10/22/21 153 lb 12.8 oz (69.8 kg)  10/21/21 154 lb 6.4 oz (70 kg)     Exam: General: Pt appears well and is in NAD   Neck: General: Supple without adenopathy. Thyroid: Thyroid size normal.  No goiter or nodules appreciated.  Lungs: Clear with good BS bilat   Heart: RRR   Extremities: No pretibial edema.   Neuro: MS is good with appropriate affect, pt is alert and Ox3    DM foot exam: 10/21/2021  The skin of the feet is intact without sores or ulcerations. The pedal pulses are 2+ on right and 2+ on left. The sensation is decreased  to a screening 5.07, 10 gram monofilament bilaterally at the toes           DATA REVIEWED:  Lab Results  Component Value Date   HGBA1C 7.1 (A) 04/25/2022   HGBA1C 6.5 (A) 10/21/2021   HGBA1C 7.0 (A) 04/22/2021   Lab Results  Component Value Date   MICROALBUR <0.7 04/29/2021   LDLCALC 111 (H) 04/29/2021   CREATININE 1.25 04/29/2021   Lab Results  Component Value Date   MICRALBCREAT 0.6 04/29/2021     Lab Results  Component Value Date   CHOL 186 04/29/2021   HDL 47.40 04/29/2021   LDLCALC 111 (H) 04/29/2021   TRIG 135.0 04/29/2021   CHOLHDL 4 04/29/2021         ASSESSMENT / PLAN / RECOMMENDATIONS:   1) Type 2 Diabetes Mellitus, optimally controlled, With neuropathic  complications - Most recent A1c of 7.1%. Goal A1c < 8.0 %.     -He was endorsing hypoglycemia in the 50's which have resolved.  - Pt was eating cakes around the holiday, suspect this will improve  -No changes   MEDICATIONS: Continue metformin 500 mg XR twice daily Continue  repaglinide 0.5 mg, 1 tablet before breakfast  EDUCATION / INSTRUCTIONS: BG monitoring instructions: Patient is instructed to check his blood sugars 1 times a day, fasting . Call [facility] if: BG persistently < 70 I reviewed the Rule of 15 for the treatment of hypoglycemia in detail with the patient. Literature supplied.   2) Diabetic complications:  Eye: Does not have known diabetic retinopathy.  Neuro/ Feet: Does  have known diabetic peripheral neuropathy .  Renal: Patient does not  have known baseline CKD. He   is  on an ACEI/ARB at present.       F/U in 6 months     Signed electronically by: Mack Guise, MD  [facility]  [facility] [address] [place], [place] [address] Phone: [phone] FAX: [phone]   CC: [address] [place] [address] Phone: [phone]  Fax: [phone]  Return to Endocrinology clinic as below: Future Appointments  Date Time Provider [place]  10/26/2022  8:20 AM [facility]

## 2022-04-25 NOTE — Patient Instructions (Signed)
-  Continue Metformin 500 mg 1 tablet twice a day  - Continue Repaglinide 0.5 mg , 1 tablet before breakfast       HOW TO TREAT LOW BLOOD SUGARS (Blood sugar LESS THAN 70 MG/DL) Please follow the RULE OF 15 for the treatment of hypoglycemia treatment (when your (blood sugars are less than 70 mg/dL)   STEP 1: Take 15 grams of carbohydrates when your blood sugar is low, which includes:  3-4 GLUCOSE TABS  OR 3-4 OZ OF JUICE OR REGULAR SODA OR ONE TUBE OF GLUCOSE GEL    STEP 2: RECHECK blood sugar in 15 MINUTES STEP 3: If your blood sugar is still low at the 15 minute recheck --> then, go back to STEP 1 and treat AGAIN with another 15 grams of carbohydrates.

## 2022-05-30 ENCOUNTER — Other Ambulatory Visit: Payer: Self-pay

## 2022-05-30 DIAGNOSIS — E1142 Type 2 diabetes mellitus with diabetic polyneuropathy: Secondary | ICD-10-CM

## 2022-05-30 MED ORDER — REPAGLINIDE 0.5 MG PO TABS: 0.5000 mg | ORAL_TABLET | Freq: Every day | ORAL | 3 refills | Status: AC

## 2022-05-30 MED ORDER — METFORMIN HCL ER 500 MG PO TB24: 500.0000 mg | ORAL_TABLET | Freq: Two times a day (BID) | ORAL | 3 refills | Status: AC

## 2022-06-09 ENCOUNTER — Telehealth: Payer: Self-pay

## 2022-06-09 NOTE — Telephone Encounter (Signed)
[facility] want to make sure patient is suppose to be on Metformin XR '500mg'$  2 times daily.  They have a different script on file from previous.

## 2022-07-07 ENCOUNTER — Telehealth: Payer: Self-pay | Admitting: Family Medicine

## 2022-07-07 DIAGNOSIS — E1149 Type 2 diabetes mellitus with other diabetic neurological complication: Secondary | ICD-10-CM

## 2022-07-07 DIAGNOSIS — R634 Abnormal weight loss: Secondary | ICD-10-CM

## 2022-07-07 DIAGNOSIS — I10 Essential (primary) hypertension: Secondary | ICD-10-CM

## 2022-07-07 MED ORDER — RAMIPRIL 5 MG PO CAPS: 5.0000 mg | ORAL_CAPSULE | Freq: Every day | ORAL | 3 refills | Status: DC

## 2022-07-07 MED ORDER — OMEPRAZOLE 20 MG PO CPDR: 20.0000 mg | DELAYED_RELEASE_CAPSULE | Freq: Every day | ORAL | 3 refills | Status: DC

## 2022-07-07 MED ORDER — MIRTAZAPINE 15 MG PO TABS: ORAL_TABLET | ORAL | 1 refills | Status: DC

## 2022-07-07 MED ORDER — FUROSEMIDE 20 MG PO TABS: ORAL_TABLET | ORAL | 10 refills | Status: DC

## 2022-07-07 MED ORDER — TAMSULOSIN HCL 0.4 MG PO CAPS: 0.4000 mg | ORAL_CAPSULE | Freq: Every day | ORAL | 2 refills | Status: DC

## 2022-07-07 MED ORDER — GABAPENTIN 600 MG PO TABS: 600.0000 mg | ORAL_TABLET | Freq: Three times a day (TID) | ORAL | 0 refills | Status: DC

## 2022-07-07 NOTE — Telephone Encounter (Signed)
Prescription Request  07/07/2022  Is this a "Controlled Substance" medicine? No  LOV: Visit date not found  What is the name of the medication or equipment?  furosemide (LASIX) 20 MG tablet   gabapentin (NEURONTIN) 600 MG tablet   omeprazole (PRILOSEC) 20 MG capsule   mirtazapine (REMERON) 15 MG tablet   ramipril (ALTACE) 5 MG capsule   tamsulosin (FLOMAX) 0.4 MG CAPS capsule   *PT asked for some eye drops but don't see that on his medication list. *  Have you contacted your pharmacy to request a refill? Yes - first time using them so a new prescription has to be sent in   Which pharmacy would you like this sent to?   [facility] - [place], [place] [address] Phone: [phone] Fax: [phone]    Patient notified that their request is being sent to the clinical staff for review and that they should receive a response within 2 business days.   Please advise at Mobile [phone] (mobile)

## 2022-07-07 NOTE — Addendum Note (Signed)
Addended by: Sanda Linger on: 07/07/2022 01:52 PM   Modules accepted: Orders

## 2022-07-07 NOTE — Telephone Encounter (Signed)
Refills sent

## 2022-07-27 ENCOUNTER — Telehealth: Payer: Self-pay | Admitting: Family Medicine

## 2022-07-27 NOTE — Telephone Encounter (Signed)
Pt calling to advise that he has gradually had some pain that shoots up all over when he coughs or moves. He said he thought it "was the pies(hemorrhoids?)" and he got preparation h to help and it did a little. Pt noticed this over the last week getting worse. Please call to advise what can be done (OTC or rx).

## 2022-07-28 ENCOUNTER — Encounter: Payer: Self-pay | Admitting: Physician Assistant

## 2022-07-28 ENCOUNTER — Ambulatory Visit (INDEPENDENT_AMBULATORY_CARE_PROVIDER_SITE_OTHER): Payer: Medicare HMO | Admitting: Family Medicine

## 2022-07-28 ENCOUNTER — Encounter: Payer: Self-pay | Admitting: Family Medicine

## 2022-07-28 VITALS — BP 162/80 | HR 74 | Ht 65.0 in | Wt 159.4 lb

## 2022-07-28 DIAGNOSIS — E785 Hyperlipidemia, unspecified: Secondary | ICD-10-CM | POA: Diagnosis not present

## 2022-07-28 DIAGNOSIS — K649 Unspecified hemorrhoids: Secondary | ICD-10-CM | POA: Diagnosis not present

## 2022-07-28 DIAGNOSIS — K644 Residual hemorrhoidal skin tags: Secondary | ICD-10-CM | POA: Insufficient documentation

## 2022-07-28 DIAGNOSIS — I10 Essential (primary) hypertension: Secondary | ICD-10-CM

## 2022-07-28 DIAGNOSIS — K6289 Other specified diseases of anus and rectum: Secondary | ICD-10-CM

## 2022-07-28 DIAGNOSIS — C61 Malignant neoplasm of prostate: Secondary | ICD-10-CM | POA: Diagnosis not present

## 2022-07-28 DIAGNOSIS — E1169 Type 2 diabetes mellitus with other specified complication: Secondary | ICD-10-CM | POA: Diagnosis not present

## 2022-07-28 LAB — LIPID PANEL
Cholesterol: 205 mg/dL — ABNORMAL HIGH (ref 0–200)
HDL: 49.5 mg/dL (ref >39.00)
LDL Cholesterol: 136 mg/dL — ABNORMAL HIGH (ref 0–99)
NonHDL: 155.53
Total CHOL/HDL Ratio: 4
Triglycerides: 96 mg/dL (ref 0.0–149.0)
VLDL: 19.2 mg/dL (ref 0.0–40.0)

## 2022-07-28 LAB — COMPREHENSIVE METABOLIC PANEL
ALT: 8 U/L (ref 0–53)
AST: 13 U/L (ref 0–37)
Albumin: 4 g/dL (ref 3.5–5.2)
Alkaline Phosphatase: 66 U/L (ref 39–117)
BUN: 12 mg/dL (ref 6–23)
CO2: 28 mEq/L (ref 19–32)
Calcium: 8.7 mg/dL (ref 8.4–10.5)
Chloride: 104 mEq/L (ref 96–112)
Creatinine, Ser: 1.14 mg/dL (ref 0.40–1.50)
GFR: 58.49 mL/min — ABNORMAL LOW (ref >60.00)
Glucose, Bld: 154 mg/dL — ABNORMAL HIGH (ref 70–99)
Potassium: 4.3 mEq/L (ref 3.5–5.1)
Sodium: 139 mEq/L (ref 135–145)
Total Bilirubin: 0.5 mg/dL (ref 0.2–1.2)
Total Protein: 6.2 g/dL (ref 6.0–8.3)

## 2022-07-28 LAB — PSA: PSA: 2.22 ng/mL (ref 0.10–4.00)

## 2022-07-28 LAB — CBC WITH DIFFERENTIAL/PLATELET
Basophils Absolute: 0 10*3/uL (ref 0.0–0.1)
Basophils Relative: 0.5 % (ref 0.0–3.0)
Eosinophils Absolute: 0.2 10*3/uL (ref 0.0–0.7)
Eosinophils Relative: 5 % (ref 0.0–5.0)
HCT: 32.4 % — ABNORMAL LOW (ref 39.0–52.0)
Hemoglobin: 10.8 g/dL — ABNORMAL LOW (ref 13.0–17.0)
Lymphocytes Relative: 35 % (ref 12.0–46.0)
Lymphs Abs: 1.2 10*3/uL (ref 0.7–4.0)
MCHC: 33.2 g/dL (ref 30.0–36.0)
MCV: 98 fl (ref 78.0–100.0)
Monocytes Absolute: 0.4 10*3/uL (ref 0.1–1.0)
Monocytes Relative: 11.9 % (ref 3.0–12.0)
Neutro Abs: 1.6 10*3/uL (ref 1.4–7.7)
Neutrophils Relative %: 47.6 % (ref 43.0–77.0)
Platelets: 245 10*3/uL (ref 150.0–400.0)
RBC: 3.31 Mil/uL — ABNORMAL LOW (ref 4.22–5.81)
RDW: 14.7 % (ref 11.5–15.5)
WBC: 3.4 10*3/uL — ABNORMAL LOW (ref 4.0–10.5)

## 2022-07-28 LAB — POC HEMOCCULT BLD/STL (OFFICE/1-CARD/DIAGNOSTIC)
Card #1 Date: NEGATIVE
Fecal Occult Blood, POC: NEGATIVE

## 2022-07-28 MED ORDER — HYDROCORTISONE ACETATE 25 MG RE SUPP: 25.0000 mg | Freq: Two times a day (BID) | RECTAL | 0 refills | Status: DC

## 2022-07-28 NOTE — Assessment & Plan Note (Signed)
Pt has appointment with urology next month

## 2022-07-28 NOTE — Assessment & Plan Note (Signed)
Well controlled, no changes to meds. Encouraged heart healthy diet such as the DASH diet and exercise as tolerated.  °

## 2022-07-28 NOTE — Patient Instructions (Signed)
Hemorrhoids Hemorrhoids are swollen veins in and around the rectum or the opening of the butt (anus). There are two types of hemorrhoids: Internal. These occur in the veins just inside the rectum. They may poke through to the outside and become irritated and painful. External. These occur in the veins outside the anus. They can be felt as a painful swelling or hard lump near the anus. Most hemorrhoids do not cause severe problems. Often, they can be treated at home with diet and lifestyle changes. If home treatments do not help, you may need a procedure to shrink or remove the hemorrhoids. What are the causes? Hemorrhoids are caused by pressure near the anus. This pressure may be caused by: Constipation or diarrhea. Straining to poop. Pregnancy. Obesity. Sitting or riding a bike for a long time. Heavy lifting or other things that cause you to strain. Anal sex. What are the signs or symptoms? Symptoms of this condition include: Pain. Anal itching or irritation. Bleeding from the rectum. Leakage of poop (stool). Swelling of the anus. One or more lumps around the anus. How is this diagnosed? Hemorrhoids can often be diagnosed through a visual exam. Other exams or tests may also be done, such as: A digital rectal exam. This is when your health care provider feels inside your rectum with a gloved finger. Anoscope. This is an exam of the anus using a small tube. A blood test, if you have lost a lot of blood. A sigmoidoscopy or colonoscopy. These are tests to look inside the colon using a tube with a camera on the end. How is this treated? In most cases, hemorrhoids can be treated at home with diet and lifestyle changes. If these changes do not help, you may need to have a procedure done. These procedures can make the hemorrhoids smaller or fully remove them. Common procedures include: Rubber band ligation. Rubber bands are placed at the base of the hemorrhoids to cut off their blood  supply. Sclerotherapy. Medicine is put into the hemorrhoids to shrink them. Infrared coagulation. A type of light energy is used to get rid of the hemorrhoids. Hemorrhoidectomy surgery. The hemorrhoids are removed during surgery. Then, the veins that supply them are tied off. Stapled hemorrhoidopexy surgery. The base of the hemorrhoid is stapled to the wall of the rectum. Follow these instructions at home: Medicines Take over-the-counter and prescription medicines only as told by your provider. Use medicated creams or medicines that are put in the rectum (suppositories) as told by your provider. Eating and drinking  Eat foods that are high in fiber, such as beans, whole grains, and fresh fruits and vegetables. Ask your provider about taking products that have fiber added to them (fiber supplements). Reduce the amount of fat in your diet. You can do this by eating low-fat dairy products, eating less red meat, and avoiding processed foods. Drink enough fluid to keep your pee (urine) pale yellow. Managing pain and swelling  Take warm sitz baths for 20 minutes, 3-4 times a day. This can help ease pain and discomfort. You may do this in a bathtub or you can use a portable sitz bath that fits over the toilet. If told, put ice on the affected area. It may help to use ice packs between sitz baths. Put ice in a plastic bag. Place a towel between your skin and the bag. Leave the ice on for 20 minutes, 2-3 times a day. If your skin turns bright red, remove the ice right away to prevent   skin damage. The risk of damage is higher if you cannot feel pain, heat, or cold. General instructions Exercise. Ask your provider how much and what kind of exercise is best for you. In general, you should do moderate exercise for at least 30 minutes on most days of the week (150 minutes each week). You may want to try walking, biking, or yoga. Go to the bathroom when you have the urge to poop. Do not wait. Avoid  straining to poop. Keep the anus dry and clean. Use wet toilet paper or moist towelettes after you poop. Do not sit on the toilet for a long time. This can increase blood pooling and pain. Where to find more information National Institute of Diabetes and Digestive and Kidney Diseases: niddk.nih.gov Contact a health care provider if: You have more pain and swelling that do not get better with treatment. You have trouble pooping or you are not able to poop. You have pain or inflammation outside the area of the hemorrhoids. Get help right away if: You are bleeding from your rectum and you cannot get it to stop. This information is not intended to replace advice given to you by your health care provider. Make sure you discuss any questions you have with your health care provider. Document Revised: 12/08/2021 Document Reviewed: 12/08/2021 Elsevier Patient Education  2023 [employer]  

## 2022-07-28 NOTE — Progress Notes (Addendum)
Subjective:   By signing my name below, I, [name], attest that this documentation has been prepared under the direction and in the presence of [name], DO. 07/28/2022   Patient ID: [name] [name], male    DOB: 1934-10-25, 88 y.o.   MRN: [id number]  Chief Complaint  Patient presents with   Hemorrhoids    Onset 07/24/2022   Osteoarthritis    HPI Patient is in today for a office visit.   He complains of rectum pain that radiates dow his leg and lower abdomen since 07/25/2022. He is not straining while having bowel movements. He did not feel an hemorrhoid around his rectum. He denies having blood in his stool. He is applying preparation-H regularly and finds relief while using it.    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    Osteoarthritis    "knees" (01/31/2014)   Prostate cancer    "no treatments; it's going away by itself w/the pills I take"    Past Surgical History:  Procedure Laterality Date   ANTERIOR CERVICAL DECOMP/DISCECTOMY FUSION N/A 09/03/2015   Procedure: ANTERIOR CERVICAL DECOMPRESSION FUSION, CERVICAL 5-6 WITH INSTRUMENTATION AND ALLOGRAFT;  Surgeon: Estill Bamberg, MD;  Location: [facility] OR;  Service: Orthopedics;  Laterality: N/A;  ANTERIOR CERVICAL DECOMPRESSION FUSION, CERVICAL 5-6 WITH INSTRUMENTATION AND ALLOGRAFT   COLONOSCOPY     FINE NEEDLE ASPIRATION Left 06/10/15   Knee   FINE NEEDLE ASPIRATION  05/26/15   Back   JOINT REPLACEMENT     TONSILLECTOMY     TOTAL KNEE ARTHROPLASTY Right 01/31/2014   TOTAL KNEE ARTHROPLASTY Right 01/31/2014   Procedure: RIGHT TOTAL KNEE ARTHROPLASTY;  Surgeon: Nestor Lewandowsky, MD;  Location: [facility] OR;  Service: Orthopedics;  Laterality: Right;     Family History  Problem Relation Age of Onset   Arthritis Other    Diabetes Other    Cancer Other        prostate   Heart disease Brother        questionable   Colon cancer Daughter 50   Diabetes Mother    Colon polyps Neg Hx    Esophageal cancer Neg Hx    Rectal cancer Neg Hx    Stomach cancer Neg Hx     Social History   Socioeconomic History   Marital status: Widowed    Spouse name: Not on file   Number of children: Not on file   Years of education: Not on file   Highest education level: Not on file  Occupational History    Comment: retired Investment banker, operational     Comment: Psychiatrist  Tobacco Use   Smoking status: Former    Types: Cigars    Quit date: 04/12/1995    Years since quitting: 27.3   Smokeless tobacco: Never   Tobacco comments:    chews cigars still   Vaping Use   Vaping Use: Never used  Substance and Sexual Activity   Alcohol use: No   Drug use: No   Sexual activity: Never  Other Topics Concern   Not on file  Social History Narrative   Works out in the garden  Social Determinants of Health   Financial Resource Strain: Low Risk  (10/19/2020)   Overall Financial Resource Strain (CARDIA)    Difficulty of Paying Living Expenses: Not hard at all  Food Insecurity: No Food Insecurity (10/19/2020)   Hunger Vital Sign    Worried About Running Out of Food in the Last Year: Never true    Ran Out of Food in the Last Year: Never true  Transportation Needs: No Transportation Needs (10/19/2020)   PRAPARE - Administrator, Civil Service (Medical): No    Lack of Transportation (Non-Medical): No  Physical Activity: Inactive (10/19/2020)   Exercise Vital Sign    Days of Exercise per Week: 0 days    Minutes of Exercise per Session: 0 min  Stress: No Stress Concern Present (10/19/2020)   [employer] of Occupational Health - Occupational Stress Questionnaire    Feeling of Stress : Not at all  Social Connections: Moderately Isolated (10/19/2020)   Social  Connection and Isolation Panel [NHANES]    Frequency of Communication with Friends and Family: More than three times a week    Frequency of Social Gatherings with Friends and Family: More than three times a week    Attends Religious Services: More than 4 times per year    Active Member of [employer] or Organizations: No    Attends Banker Meetings: Never    Marital Status: Widowed  Intimate Partner Violence: Not At Risk (10/19/2020)   Humiliation, Afraid, Rape, and Kick questionnaire    Fear of Current or Ex-Partner: No    Emotionally Abused: No    Physically Abused: No    Sexually Abused: No    Outpatient Medications Prior to Visit  Medication Sig Dispense Refill   acetaminophen (TYLENOL) 500 MG tablet Take 500 mg by mouth every 6 (six) hours as needed.     Ascorbic Acid (VITAMIN C) 1000 MG tablet Take 1,000 mg by mouth daily.     aspirin 81 MG chewable tablet Chew by mouth daily.     atorvastatin (LIPITOR) 20 MG tablet TAKE 1 TABLET EVERY DAY 90 tablet 0   b complex vitamins tablet Take 1 tablet by mouth daily.     cholecalciferol (VITAMIN D3) 25 MCG (1000 UNIT) tablet Take 1,000 Units by mouth daily.     Coenzyme Q10 (CO Q 10 PO) Take 50 mg by mouth daily.     finasteride (PROSCAR) 5 MG tablet Take 5 mg by mouth daily.     furosemide (LASIX) 20 MG tablet TAKE 1 TABLET EVERY OTHER DAY (NEED APPOINTMENT AND LABS) 45 tablet 10   gabapentin (NEURONTIN) 600 MG tablet Take 1 tablet (600 mg total) by mouth 3 (three) times daily. 270 tablet 0   glucose blood (ACCU-CHEK AVIVA PLUS) test strip 1 each by Other route daily. And lancets 1/day 100 each 12   latanoprost (XALATAN) 0.005 % ophthalmic solution Place 1 drop into both eyes at bedtime.     metFORMIN (GLUCOPHAGE-XR) 500 MG 24 hr tablet Take 1 tablet (500 mg total) by mouth in the morning and at bedtime. 180 tablet 3   mirtazapine (REMERON) 15 MG tablet TAKE 1 TABLET BY MOUTH EVERYDAY AT BEDTIME 90 tablet 1   omeprazole (PRILOSEC)  20 MG capsule Take 1 capsule (20 mg total) by mouth daily. 90 capsule 3   ramipril (ALTACE) 5 MG capsule Take 1 capsule (5 mg total) by mouth daily. 90 capsule 3   repaglinide (PRANDIN) 0.5 MG tablet Take 1  tablet (0.5 mg total) by mouth daily before breakfast. 90 tablet 3   tamsulosin (FLOMAX) 0.4 MG CAPS capsule Take 1 capsule (0.4 mg total) by mouth daily. 30 capsule 2   traMADol (ULTRAM) 50 MG tablet Take 1 tablet by mouth daily.  0   TRUE METRIX BLOOD GLUCOSE TEST test strip USE AS INSTRUCTED 200 strip 2   TRUEplus Lancets 33G MISC USE AS DIRECTED TO CHECK BLOOD SUGAR UP TO TWO TIMES A DAY. 200 each 2   No facility-administered medications prior to visit.    No Known Allergies  Review of Systems  Constitutional:  Negative for chills, fever and malaise/fatigue.  HENT:  Negative for congestion and hearing loss.   Eyes:  Negative for discharge.  Respiratory:  Negative for cough, sputum production and shortness of breath.   Cardiovascular:  Negative for chest pain, palpitations and leg swelling.  Gastrointestinal:  Negative for abdominal pain (lower abdominal pain), blood in stool, constipation, diarrhea, heartburn, nausea and vomiting.       (+)rectum pain  Genitourinary:  Negative for dysuria, frequency, hematuria and urgency.  Musculoskeletal:  Negative for back pain, falls and myalgias.       (+)bilateral leg pain  Skin:  Negative for rash.  Neurological:  Negative for dizziness, sensory change, loss of consciousness, weakness and headaches.  Endo/Heme/Allergies:  Negative for environmental allergies. Does not bruise/bleed easily.  Psychiatric/Behavioral:  Negative for depression and suicidal ideas. The patient is not nervous/anxious and does not have insomnia.        Objective:    Physical Exam Vitals and nursing note reviewed.  Constitutional:      General: He is not in acute distress.    Appearance: Normal appearance. He is not ill-appearing.  HENT:     Head:  Normocephalic and atraumatic.     Right Ear: External ear normal.     Left Ear: External ear normal.  Eyes:     Extraocular Movements: Extraocular movements intact.     Pupils: Pupils are equal, round, and reactive to light.  Cardiovascular:     Rate and Rhythm: Normal rate and regular rhythm.     Heart sounds: Normal heart sounds. No murmur heard.    No gallop.  Pulmonary:     Effort: Pulmonary effort is normal. No respiratory distress.     Breath sounds: Normal breath sounds. No wheezing or rales.  Genitourinary:    Rectum: Guaiac result negative. Tenderness and internal hemorrhoid (large internal hemorrhoids noted) present.     Comments: Pain with rectal exam----  Int hemrrhoid L side rectum Skin:    General: Skin is warm and dry.  Neurological:     Mental Status: He is alert and oriented to person, place, and time.  Psychiatric:        Judgment: Judgment normal.     BP (!) 162/80 (BP Location: Left Arm, Patient Position: Sitting, Cuff Size: Normal)   Pulse 74   Ht  (1.651 m)   Wt 159 lb 6.4 oz (72.3 kg)   SpO2 98%   BMI 26.53 kg/m  Wt Readings from Last 3 Encounters:  07/28/22 159 lb 6.4 oz (72.3 kg)  04/25/22 162 lb (73.5 kg)  10/22/21 153 lb 12.8 oz (69.8 kg)       Assessment & Plan:  Hemorrhoids, unspecified hemorrhoid type -     Hydrocortisone Acetate; Place 1 suppository (25 mg total) rectally 2 (two) times daily.  Dispense: 12 suppository; Refill: 0 -  Ambulatory referral to Gastroenterology -     POC Hemoccult Bld/Stl (1-Cd Office Dx)  Rectal pain Assessment & Plan: Painful exam  Heme neg    Orders: -     Ambulatory referral to Gastroenterology  Essential hypertension Assessment & Plan: Well controlled, no changes to meds. Encouraged heart healthy diet such as the DASH diet and exercise as tolerated.    Orders: -     Comprehensive metabolic panel -     CBC with Differential/Platelet  Hyperlipidemia associated with type 2 diabetes  mellitus Assessment & Plan: Encourage heart healthy diet such as MIND or DASH diet, increase exercise, avoid trans fats, simple carbohydrates and processed foods, consider a krill or fish or flaxseed oil cap daily.    Orders: -     Lipid panel -     Comprehensive metabolic panel -     CBC with Differential/Platelet  Prostate cancer Assessment & Plan: Pt has appointment with urology next month  Orders: -     PSA    I, [name], DO, personally preformed the services described in this documentation.  All medical record entries made by the scribe were at my direction and in my presence.  I have reviewed the chart and discharge instructions (if applicable) and agree that the record reflects my personal performance and is accurate and complete. 07/28/2022   I,[name],acting as a scribe for [name], DO.,have documented all relevant documentation on the behalf of [name], DO,as directed by  [name], DO while in the presence of [name], DO.   [name], DO

## 2022-07-28 NOTE — Assessment & Plan Note (Signed)
Encourage heart healthy diet such as MIND or DASH diet, increase exercise, avoid trans fats, simple carbohydrates and processed foods, consider a krill or fish or flaxseed oil cap daily.  °

## 2022-07-28 NOTE — Assessment & Plan Note (Signed)
Painful exam  Heme neg

## 2022-08-01 ENCOUNTER — Other Ambulatory Visit: Payer: Self-pay | Admitting: Family Medicine

## 2022-08-01 DIAGNOSIS — D649 Anemia, unspecified: Secondary | ICD-10-CM

## 2022-08-02 ENCOUNTER — Ambulatory Visit
Admission: RE | Admit: 2022-08-02 | Discharge: 2022-08-02 | Disposition: A | Discharge status: Home / Self Care | Payer: Medicare HMO | Source: Ambulatory Visit | Attending: Urgent Care | Admitting: Urgent Care

## 2022-08-02 VITALS — BP 176/83 | HR 64 | Temp 98.6°F | Resp 16

## 2022-08-02 DIAGNOSIS — K644 Residual hemorrhoidal skin tags: Secondary | ICD-10-CM | POA: Diagnosis not present

## 2022-08-02 MED ORDER — HYDROCORTISONE (PERIANAL) 2.5 % EX CREA: 1.0000 | TOPICAL_CREAM | Freq: Two times a day (BID) | CUTANEOUS | 0 refills | Status: AC

## 2022-08-02 MED ORDER — DOCUSATE SODIUM 100 MG PO CAPS: 100.0000 mg | ORAL_CAPSULE | Freq: Two times a day (BID) | ORAL | 0 refills | Status: AC

## 2022-08-02 NOTE — ED Provider Notes (Signed)
[facility]  Note:  This document was prepared using Conservation officer, historic buildings and may include unintentional dictation errors.  MRN: [id number] DOB: 15-May-1934  Subjective:   [name] [name] is a 88 y.o. male presenting for 1 week history of persistent perianal pain, concern for hemorrhoids. Was referred by his PCP to a specialty.  Has a history of diverticulitis. No abdominal pain, blood in his stools. Has been using cream, sitz baths. Reports that generally he does not have constipation but he did the week before his symptoms started.   No current facility-administered medications for this encounter.  Current Outpatient Medications:    acetaminophen (TYLENOL) 500 MG tablet, Take 500 mg by mouth every 6 (six) hours as needed., Disp: , Rfl:    Ascorbic Acid (VITAMIN C) 1000 MG tablet, Take 1,000 mg by mouth daily., Disp: , Rfl:    aspirin 81 MG chewable tablet, Chew by mouth daily., Disp: , Rfl:    atorvastatin (LIPITOR) 20 MG tablet, TAKE 1 TABLET EVERY DAY, Disp: 90 tablet, Rfl: 0   b complex vitamins tablet, Take 1 tablet by mouth daily., Disp: , Rfl:    cholecalciferol (VITAMIN D3) 25 MCG (1000 UNIT) tablet, Take 1,000 Units by mouth daily., Disp: , Rfl:    Coenzyme Q10 (CO Q 10 PO), Take 50 mg by mouth daily., Disp: , Rfl:    finasteride (PROSCAR) 5 MG tablet, Take 5 mg by mouth daily., Disp: , Rfl:    furosemide (LASIX) 20 MG tablet, TAKE 1 TABLET EVERY OTHER DAY (NEED APPOINTMENT AND LABS), Disp: 45 tablet, Rfl: 10   gabapentin (NEURONTIN) 600 MG tablet, Take 1 tablet (600 mg total) by mouth 3 (three) times daily., Disp: 270 tablet, Rfl: 0   glucose blood (ACCU-CHEK AVIVA PLUS) test strip, 1 each by Other route daily. And lancets 1/day, Disp: 100 each, Rfl: 12   hydrocortisone (ANUSOL-HC) 25 MG suppository, Place 1 suppository (25 mg total) rectally 2 (two) times daily., Disp: 12 suppository, Rfl: 0   latanoprost (XALATAN) 0.005 % ophthalmic solution,  Place 1 drop into both eyes at bedtime., Disp: , Rfl:    metFORMIN (GLUCOPHAGE-XR) 500 MG 24 hr tablet, Take 1 tablet (500 mg total) by mouth in the morning and at bedtime., Disp: 180 tablet, Rfl: 3   mirtazapine (REMERON) 15 MG tablet, TAKE 1 TABLET BY MOUTH EVERYDAY AT BEDTIME, Disp: 90 tablet, Rfl: 1   omeprazole (PRILOSEC) 20 MG capsule, Take 1 capsule (20 mg total) by mouth daily., Disp: 90 capsule, Rfl: 3   ramipril (ALTACE) 5 MG capsule, Take 1 capsule (5 mg total) by mouth daily., Disp: 90 capsule, Rfl: 3   repaglinide (PRANDIN) 0.5 MG tablet, Take 1 tablet (0.5 mg total) by mouth daily before breakfast., Disp: 90 tablet, Rfl: 3   tamsulosin (FLOMAX) 0.4 MG CAPS capsule, Take 1 capsule (0.4 mg total) by mouth daily., Disp: 30 capsule, Rfl: 2   traMADol (ULTRAM) 50 MG tablet, Take 1 tablet by mouth daily., Disp: , Rfl: 0   TRUE METRIX BLOOD GLUCOSE TEST test strip, USE AS INSTRUCTED, Disp: 200 strip, Rfl: 2   TRUEplus Lancets 33G MISC, USE AS DIRECTED TO CHECK BLOOD SUGAR UP TO TWO TIMES A DAY., Disp: 200 each, Rfl: 2   No Known Allergies  Past Medical History:  Diagnosis Date   Diabetes mellitus 1955   type 2 - fasting 100-120   Diverticulosis 2009   GERD (gastroesophageal reflux disease)    Glaucoma  bilateral   History of shingles    Hyperlipidemia    5 yrs   Hypertension    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 area    Osteoarthritis    "knees" (01/31/2014)   Prostate cancer    "no treatments; it's going away by itself w/the pills I take"     Past Surgical History:  Procedure Laterality Date   ANTERIOR CERVICAL DECOMP/DISCECTOMY FUSION N/A 09/03/2015   Procedure: ANTERIOR CERVICAL DECOMPRESSION FUSION, CERVICAL 5-6 WITH INSTRUMENTATION AND ALLOGRAFT;  Surgeon: Estill Bamberg, MD;  Location: [facility];  Service: Orthopedics;  Laterality: N/A;  ANTERIOR CERVICAL DECOMPRESSION FUSION, CERVICAL 5-6 WITH INSTRUMENTATION AND ALLOGRAFT   COLONOSCOPY     FINE  NEEDLE ASPIRATION Left 06/10/15   Knee   FINE NEEDLE ASPIRATION  05/26/15   Back   JOINT REPLACEMENT     TONSILLECTOMY     TOTAL KNEE ARTHROPLASTY Right 01/31/2014   TOTAL KNEE ARTHROPLASTY Right 01/31/2014   Procedure: RIGHT TOTAL KNEE ARTHROPLASTY;  Surgeon: Nestor Lewandowsky, MD;  Location: [facility];  Service: Orthopedics;  Laterality: Right;    Family History  Problem Relation Age of Onset   Arthritis Other    Diabetes Other    Cancer Other        prostate   Heart disease Brother        questionable   Colon cancer Daughter 25   Diabetes Mother    Colon polyps Neg Hx    Esophageal cancer Neg Hx    Rectal cancer Neg Hx    Stomach cancer Neg Hx     Social History   Tobacco Use   Smoking status: Former    Types: Cigars    Quit date: 04/12/1995    Years since quitting: 27.3   Smokeless tobacco: Never   Tobacco comments:    chews cigars still   Vaping Use   Vaping Use: Never used  Substance Use Topics   Alcohol use: No   Drug use: No    ROS   Objective:   Vitals: BP (!) 176/83   Pulse 64   Temp 98.6 F (37 C) (Oral)   Resp 16   SpO2 97%   Physical Exam Constitutional:      General: He is not in acute distress.    Appearance: Normal appearance. He is well-developed and normal weight. He is not ill-appearing, toxic-appearing or diaphoretic.  HENT:     Head: Normocephalic and atraumatic.     Right Ear: External ear normal.     Left Ear: External ear normal.     Nose: Nose normal.     Mouth/Throat:     Pharynx: Oropharynx is clear.  Eyes:     General: No scleral icterus.       Right eye: No discharge.        Left eye: No discharge.     Extraocular Movements: Extraocular movements intact.  Cardiovascular:     Rate and Rhythm: Normal rate.  Pulmonary:     Effort: Pulmonary effort is normal.  Abdominal:     General: Bowel sounds are normal. There is no distension.     Palpations: Abdomen is soft. There is no mass.     Tenderness: There is no abdominal  tenderness. There is no right CVA tenderness, left CVA tenderness, guarding or rebound.  Genitourinary:    Rectum: Tenderness and external hemorrhoid present. No anal fissure or internal hemorrhoid.    Musculoskeletal:  Cervical back: Normal range of motion.  Neurological:     Mental Status: He is alert and oriented to person, place, and time.  Psychiatric:        Mood and Affect: Mood normal.        Behavior: Behavior normal.        Thought Content: Thought content normal.        Judgment: Judgment normal.     Assessment and Plan :   PDMP not reviewed this encounter.  1. External hemorrhoid    Has an obvious hemorrhoid, non-thrombosed. Recommended topical therapy, stool softener. Follow up with PCP and keep the referral appointment. Counseled patient on potential for adverse effects with medications prescribed/recommended today, ER and return-to-clinic precautions discussed, patient verbalized understanding.    Wallis Bamberg, PA-C 08/02/22 1046

## 2022-08-02 NOTE — ED Triage Notes (Signed)
Pt c/o hemorrhoids x1 weeks. PCP referred to specialist, but cannot get appt until August. Has been using cream and sitz baths.

## 2022-08-23 ENCOUNTER — Encounter: Payer: Self-pay | Admitting: Family Medicine

## 2022-08-23 ENCOUNTER — Ambulatory Visit (INDEPENDENT_AMBULATORY_CARE_PROVIDER_SITE_OTHER): Payer: Medicare HMO | Admitting: Family Medicine

## 2022-08-23 VITALS — BP 150/90 | HR 72 | Temp 97.6°F | Resp 18 | Ht 65.0 in | Wt 161.4 lb

## 2022-08-23 DIAGNOSIS — C61 Malignant neoplasm of prostate: Secondary | ICD-10-CM | POA: Diagnosis not present

## 2022-08-23 DIAGNOSIS — D649 Anemia, unspecified: Secondary | ICD-10-CM | POA: Diagnosis not present

## 2022-08-23 DIAGNOSIS — Z0000 Encounter for general adult medical examination without abnormal findings: Secondary | ICD-10-CM

## 2022-08-23 DIAGNOSIS — I10 Essential (primary) hypertension: Secondary | ICD-10-CM | POA: Diagnosis not present

## 2022-08-23 DIAGNOSIS — E1169 Type 2 diabetes mellitus with other specified complication: Secondary | ICD-10-CM

## 2022-08-23 DIAGNOSIS — D509 Iron deficiency anemia, unspecified: Secondary | ICD-10-CM | POA: Diagnosis not present

## 2022-08-23 DIAGNOSIS — E785 Hyperlipidemia, unspecified: Secondary | ICD-10-CM | POA: Diagnosis not present

## 2022-08-23 DIAGNOSIS — K644 Residual hemorrhoidal skin tags: Secondary | ICD-10-CM | POA: Diagnosis not present

## 2022-08-23 LAB — CBC WITH DIFFERENTIAL/PLATELET
Basophils Absolute: 0 10*3/uL (ref 0.0–0.1)
Basophils Relative: 0.3 % (ref 0.0–3.0)
Eosinophils Absolute: 0.2 10*3/uL (ref 0.0–0.7)
Eosinophils Relative: 3.8 % (ref 0.0–5.0)
HCT: 33.6 % — ABNORMAL LOW (ref 39.0–52.0)
Hemoglobin: 11.2 g/dL — ABNORMAL LOW (ref 13.0–17.0)
Lymphocytes Relative: 40 % (ref 12.0–46.0)
Lymphs Abs: 1.7 10*3/uL (ref 0.7–4.0)
MCHC: 33.5 g/dL (ref 30.0–36.0)
MCV: 96.4 fl (ref 78.0–100.0)
Monocytes Absolute: 0.5 10*3/uL (ref 0.1–1.0)
Monocytes Relative: 12.5 % — ABNORMAL HIGH (ref 3.0–12.0)
Neutro Abs: 1.8 10*3/uL (ref 1.4–7.7)
Neutrophils Relative %: 43.4 % (ref 43.0–77.0)
Platelets: 274 10*3/uL (ref 150.0–400.0)
RBC: 3.48 Mil/uL — ABNORMAL LOW (ref 4.22–5.81)
RDW: 14 % (ref 11.5–15.5)
WBC: 4.2 10*3/uL (ref 4.0–10.5)

## 2022-08-23 NOTE — Assessment & Plan Note (Signed)
Recheck labs today  Lab Results  Component Value Date   WBC 3.4 (L) 07/28/2022   HGB 10.8 (L) 07/28/2022   HCT 32.4 (L) 07/28/2022   MCV 98.0 07/28/2022   PLT 245.0 07/28/2022

## 2022-08-23 NOTE — Assessment & Plan Note (Addendum)
Well controlled, no changes to meds. Encouraged heart healthy diet such as the DASH diet and exercise as tolerated.  °

## 2022-08-23 NOTE — Assessment & Plan Note (Signed)
Per u rology 

## 2022-08-23 NOTE — Assessment & Plan Note (Signed)
Ghm utd Check labs  See AVS  Health Maintenance  Topic Date Due   DTaP/Tdap/Td (2 - Tdap) 10/15/2017   OPHTHALMOLOGY EXAM  10/27/2020   COVID-19 Vaccine (5 - 2023-24 season) 12/10/2021   Diabetic kidney evaluation - Urine ACR  04/29/2022   FOOT EXAM  10/22/2022   Medicare Annual Wellness (AWV)  10/23/2022   HEMOGLOBIN A1C  10/24/2022   INFLUENZA VACCINE  11/10/2022   Diabetic kidney evaluation - eGFR measurement  07/28/2023   Pneumonia Vaccine 5+ Years old  Completed   Zoster Vaccines- Shingrix  Completed   HPV VACCINES  Aged Out

## 2022-08-23 NOTE — Assessment & Plan Note (Signed)
Tolerating statin, encouraged heart healthy diet, avoid trans fats, minimize simple carbs and saturated fats. Increase exercise as tolerated 

## 2022-08-23 NOTE — Progress Notes (Signed)
Subjective:   By signing my name below, I, [name] [name], attest that this documentation has been prepared under the direction and in the presence of [name] Schultz, DO. 08/23/2022   Patient ID: [name] [name], male    DOB: June 04, 1934, 88 y.o.   MRN: [id number]  Chief Complaint  Patient presents with   Annual Exam    Pt states fasting     HPI Patient is in today for a comprehensive physical exam.   He was admitted to the ED following his last visit here due to his hemorrhoid pain not improving while taking 25 mg hydrocortisone acetate suppository 2x daily. He reports his pain has improved and his bowel movements are normal following his ED visit.  ED gave in colace as well He continues having right knee pain. His knee feels like it "gives out" on him. He is applying icy hot cream to the area and finds pain relief after a couple minutes. He continues following up with his sports medicine specialist and has an upcomming injection with them to manage his knee pain.  He continues following up with his urologist for his prostate and GI specialist for his hemorrhoids.  He denies fever, new moles, congestion, sinus pain, sore throat, chest pain, palpitations, cough, shortness of breath, wheezing, nausea, vomiting, abdominal pain, diarrhea, constipation, dysuria, frequency, hematuria, new muscle pain, new joint pain, or headaches at this time.  Colonoscopy was last completed 08/21/2018. Results showed one 3 mm polyp in the hepatic flexure which was removed, diverticulosis in the sigmoid colon and ascending colon, otherwise results are normal.  PSA was last completed 07/28/2022 and results showed 2.22.  He is UTD on vision care. He is due for dental care. He has lower set of dentures and reports he needs an upper set in the future.    Past Medical History:  Diagnosis Date   Diabetes mellitus 1955   type 2 - fasting 100-120   Diverticulosis 2009   GERD (gastroesophageal reflux disease)     Glaucoma    bilateral   History of shingles    Hyperlipidemia    5 yrs   Hypertension    Neuromuscular disorder (HCC)    due to shingles around abdomen area    Osteoarthritis    "knees" (01/31/2014)   Prostate cancer (HCC)    "no treatments; it's going away by itself w/the pills I take"    Past Surgical History:  Procedure Laterality Date   ANTERIOR CERVICAL DECOMP/DISCECTOMY FUSION N/A 09/03/2015   Procedure: ANTERIOR CERVICAL DECOMPRESSION FUSION, CERVICAL 5-6 WITH INSTRUMENTATION AND ALLOGRAFT;  Surgeon: Estill Bamberg, MD;  Location: [facility] OR;  Service: Orthopedics;  Laterality: N/A;  ANTERIOR CERVICAL DECOMPRESSION FUSION, CERVICAL 5-6 WITH INSTRUMENTATION AND ALLOGRAFT   COLONOSCOPY     FINE NEEDLE ASPIRATION Left 06/10/15   Knee   FINE NEEDLE ASPIRATION  05/26/15   Back   JOINT REPLACEMENT     TONSILLECTOMY     TOTAL KNEE ARTHROPLASTY Right 01/31/2014   TOTAL KNEE ARTHROPLASTY Right 01/31/2014   Procedure: RIGHT TOTAL KNEE ARTHROPLASTY;  Surgeon: Nestor Lewandowsky, MD;  Location: [facility] OR;  Service: Orthopedics;  Laterality: Right;    Family History  Problem Relation Age of Onset   Arthritis Other    Diabetes Other    Cancer Other        prostate   Heart disease Brother        questionable   Colon cancer Daughter 76   Diabetes Mother  Colon polyps Neg Hx    Esophageal cancer Neg Hx    Rectal cancer Neg Hx    Stomach cancer Neg Hx     Social History   Socioeconomic History   Marital status: Widowed    Spouse name: Not on file   Number of children: Not on file   Years of education: Not on file   Highest education level: Not on file  Occupational History    Comment: retired Investment banker, operational     Comment: Psychiatrist  Tobacco Use   Smoking status: Former    Types: Cigars    Quit date: 04/12/1995    Years since quitting: 27.3   Smokeless tobacco: Never   Tobacco comments:    chews cigars still   Vaping Use   Vaping Use: Never used  Substance and Sexual Activity   Alcohol  use: No   Drug use: No   Sexual activity: Never  Other Topics Concern   Not on file  Social History Narrative   Works out in the garden   Social Determinants of Corporate investment banker Strain: Low Risk  (10/19/2020)   Overall Financial Resource Strain (CARDIA)    Difficulty of Paying Living Expenses: Not hard at all  Food Insecurity: No Food Insecurity (10/19/2020)   Hunger Vital Sign    Worried About Running Out of Food in the Last Year: Never true    Ran Out of Food in the Last Year: Never true  Transportation Needs: No Transportation Needs (10/19/2020)   PRAPARE - Administrator, Civil Service (Medical): No    Lack of Transportation (Non-Medical): No  Physical Activity: Inactive (10/19/2020)   Exercise Vital Sign    Days of Exercise per Week: 0 days    Minutes of Exercise per Session: 0 min  Stress: No Stress Concern Present (10/19/2020)   [employer] of Occupational Health - Occupational Stress Questionnaire    Feeling of Stress : Not at all  Social Connections: Moderately Isolated (10/19/2020)   Social Connection and Isolation Panel [NHANES]    Frequency of Communication with Friends and Family: More than three times a week    Frequency of Social Gatherings with Friends and Family: More than three times a week    Attends Religious Services: More than 4 times per year    Active Member of [employer] or Organizations: No    Attends Banker Meetings: Never    Marital Status: Widowed  Intimate Partner Violence: Not At Risk (10/19/2020)   Humiliation, Afraid, Rape, and Kick questionnaire    Fear of Current or Ex-Partner: No    Emotionally Abused: No    Physically Abused: No    Sexually Abused: No    Outpatient Medications Prior to Visit  Medication Sig Dispense Refill   acetaminophen (TYLENOL) 500 MG tablet Take 500 mg by mouth every 6 (six) hours as needed.     Ascorbic Acid (VITAMIN C) 1000 MG tablet Take 1,000 mg by mouth daily.     aspirin 81  MG chewable tablet Chew by mouth daily.     atorvastatin (LIPITOR) 20 MG tablet TAKE 1 TABLET EVERY DAY 90 tablet 0   b complex vitamins tablet Take 1 tablet by mouth daily.     cholecalciferol (VITAMIN D3) 25 MCG (1000 UNIT) tablet Take 1,000 Units by mouth daily.     Coenzyme Q10 (CO Q 10 PO) Take 50 mg by mouth daily.     docusate sodium (COLACE) 100  MG capsule Take 1 capsule (100 mg total) by mouth every 12 (twelve) hours. 60 capsule 0   finasteride (PROSCAR) 5 MG tablet Take 5 mg by mouth daily.     furosemide (LASIX) 20 MG tablet TAKE 1 TABLET EVERY OTHER DAY (NEED APPOINTMENT AND LABS) 45 tablet 10   gabapentin (NEURONTIN) 600 MG tablet Take 1 tablet (600 mg total) by mouth 3 (three) times daily. 270 tablet 0   glucose blood (ACCU-CHEK AVIVA PLUS) test strip 1 each by Other route daily. And lancets 1/day 100 each 12   hydrocortisone (ANUSOL-HC) 2.5 % rectal cream Place 1 Application rectally 2 (two) times daily. 30 g 0   latanoprost (XALATAN) 0.005 % ophthalmic solution Place 1 drop into both eyes at bedtime.     metFORMIN (GLUCOPHAGE-XR) 500 MG 24 hr tablet Take 1 tablet (500 mg total) by mouth in the morning and at bedtime. 180 tablet 3   mirtazapine (REMERON) 15 MG tablet TAKE 1 TABLET BY MOUTH EVERYDAY AT BEDTIME 90 tablet 1   omeprazole (PRILOSEC) 20 MG capsule Take 1 capsule (20 mg total) by mouth daily. 90 capsule 3   ramipril (ALTACE) 5 MG capsule Take 1 capsule (5 mg total) by mouth daily. 90 capsule 3   repaglinide (PRANDIN) 0.5 MG tablet Take 1 tablet (0.5 mg total) by mouth daily before breakfast. 90 tablet 3   tamsulosin (FLOMAX) 0.4 MG CAPS capsule Take 1 capsule (0.4 mg total) by mouth daily. 30 capsule 2   traMADol (ULTRAM) 50 MG tablet Take 1 tablet by mouth daily.  0   TRUE METRIX BLOOD GLUCOSE TEST test strip USE AS INSTRUCTED 200 strip 2   TRUEplus Lancets 33G MISC USE AS DIRECTED TO CHECK BLOOD SUGAR UP TO TWO TIMES A DAY. 200 each 2   No facility-administered  medications prior to visit.    No Known Allergies  Review of Systems  Constitutional:  Negative for fever and malaise/fatigue.  HENT:  Negative for congestion, sinus pain and sore throat.   Eyes:  Negative for blurred vision.  Respiratory:  Negative for cough, shortness of breath and wheezing.   Cardiovascular:  Negative for chest pain, palpitations and leg swelling.  Gastrointestinal:  Negative for abdominal pain, blood in stool, constipation, diarrhea, nausea and vomiting.  Genitourinary:  Negative for dysuria, frequency and hematuria.  Musculoskeletal:  Negative for back pain and falls.       (-)new muscle pain (-)new joint pain (+)right knee pain  Skin:  Negative for rash.       (-)New moles  Neurological:  Negative for dizziness, loss of consciousness and headaches.  Endo/Heme/Allergies:  Negative for environmental allergies.  Psychiatric/Behavioral:  Negative for depression. The patient is not nervous/anxious.        Objective:    Physical Exam Vitals and nursing note reviewed.  Constitutional:      General: He is not in acute distress.    Appearance: Normal appearance. He is well-developed. He is not ill-appearing.  HENT:     Head: Normocephalic and atraumatic.     Right Ear: Tympanic membrane, ear canal and external ear normal.     Left Ear: Tympanic membrane, ear canal and external ear normal.  Eyes:     Extraocular Movements: Extraocular movements intact.     Pupils: Pupils are equal, round, and reactive to light.  Neck:     Thyroid: No thyromegaly.  Cardiovascular:     Rate and Rhythm: Normal rate and regular rhythm.  Heart sounds: Normal heart sounds. No murmur heard.    No gallop.  Pulmonary:     Effort: Pulmonary effort is normal. No respiratory distress.     Breath sounds: Normal breath sounds. No wheezing or rales.  Chest:     Chest wall: No tenderness.  Abdominal:     General: Bowel sounds are normal. There is no distension.     Palpations:  Abdomen is soft.     Tenderness: There is no abdominal tenderness. There is no guarding.  Musculoskeletal:        General: Swelling and tenderness present.     Cervical back: Normal range of motion and neck supple.     Right hip: Tenderness present. Normal range of motion. Normal strength.     Left hip: Tenderness present. Normal range of motion. Normal strength.     Right knee: Swelling present. No erythema. Tenderness present.     Left knee: No swelling or erythema. Tenderness present.     Right foot: Bony tenderness present. No swelling.     Left foot: Bony tenderness present. No swelling.  Skin:    General: Skin is warm and dry.  Neurological:     General: No focal deficit present.     Mental Status: He is alert and oriented to person, place, and time.  Psychiatric:        Behavior: Behavior normal.        Thought Content: Thought content normal.        Judgment: Judgment normal.     BP (!) 150/90 (BP Location: Left Arm, Patient Position: Sitting, Cuff Size: Normal)   Pulse 72   Temp 97.6 F (36.4 C) (Oral)   Resp 18   Ht 5\' 5"  (1.651 m)   Wt 161 lb 6.4 oz (73.2 kg)   SpO2 99%   BMI 26.86 kg/m  Wt Readings from Last 3 Encounters:  08/23/22 161 lb 6.4 oz (73.2 kg)  07/28/22 159 lb 6.4 oz (72.3 kg)  04/25/22 162 lb (73.5 kg)       Assessment & Plan:  Preventative health care Assessment & Plan: Ghm utd Check labs  See AVS  Health Maintenance  Topic Date Due   DTaP/Tdap/Td (2 - Tdap) 10/15/2017   OPHTHALMOLOGY EXAM  10/27/2020   COVID-19 Vaccine (5 - 2023-24 season) 12/10/2021   Diabetic kidney evaluation - Urine ACR  04/29/2022   FOOT EXAM  10/22/2022   Medicare Annual Wellness (AWV)  10/23/2022   HEMOGLOBIN A1C  10/24/2022   INFLUENZA VACCINE  11/10/2022   Diabetic kidney evaluation - eGFR measurement  07/28/2023   Pneumonia Vaccine 7+ Years old  Completed   Zoster Vaccines- Shingrix  Completed   HPV VACCINES  Aged Out      Essential  hypertension Assessment & Plan: Well controlled, no changes to meds. Encouraged heart healthy diet such as the DASH diet and exercise as tolerated.     Hyperlipidemia associated with type 2 diabetes mellitus (HCC) Assessment & Plan: Tolerating statin, encouraged heart healthy diet, avoid trans fats, minimize simple carbs and saturated fats. Increase exercise as tolerated    Iron deficiency anemia, unspecified iron deficiency anemia type Assessment & Plan: Recheck labs today  Lab Results  Component Value Date   WBC 3.4 (L) 07/28/2022   HGB 10.8 (L) 07/28/2022   HCT 32.4 (L) 07/28/2022   MCV 98.0 07/28/2022   PLT 245.0 07/28/2022      Prostate cancer [facility]) Assessment & Plan: Per urology  Anemia, unspecified type -     Iron, TIBC and Ferritin Panel -     CBC with Differential/Platelet  External hemorrhoid Assessment & Plan: Resolved with hydrocortisone supp and colace  Inc fiber in diet Pt does have app with GI next month     I, [name] Schultz, DO, personally preformed the services described in this documentation.  All medical record entries made by the scribe were at my direction and in my presence.  I have reviewed the chart and discharge instructions (if applicable) and agree that the record reflects my personal performance and is accurate and complete. 08/23/2022   I,[name] [name],acting as a scribe for [name] Schultz, DO.,have documented all relevant documentation on the behalf of [name] Schultz, DO,as directed by  [name] Schultz, DO while in the presence of [name] Schultz, DO.   [name] Schultz, DO

## 2022-08-23 NOTE — Assessment & Plan Note (Signed)
Resolved with hydrocortisone supp and colace  Inc fiber in diet Pt does have app with GI next month

## 2022-08-23 NOTE — Patient Instructions (Signed)
Preventive Care 65 Years and Older, Male Preventive care refers to lifestyle choices and visits with your health care provider that can promote health and wellness. Preventive care visits are also called wellness exams. What can I expect for my preventive care visit? Counseling During your preventive care visit, your health care provider may ask about your: Medical history, including: Past medical problems. Family medical history. History of falls. Current health, including: Emotional well-being. Home life and relationship well-being. Sexual activity. Memory and ability to understand (cognition). Lifestyle, including: Alcohol, nicotine or tobacco, and drug use. Access to firearms. Diet, exercise, and sleep habits. Work and work environment. Sunscreen use. Safety issues such as seatbelt and bike helmet use. Physical exam Your health care provider will check your: Height and weight. These may be used to calculate your BMI (body mass index). BMI is a measurement that tells if you are at a healthy weight. Waist circumference. This measures the distance around your waistline. This measurement also tells if you are at a healthy weight and may help predict your risk of certain diseases, such as type 2 diabetes and high blood pressure. Heart rate and blood pressure. Body temperature. Skin for abnormal spots. What immunizations do I need?  Vaccines are usually given at various ages, according to a schedule. Your health care provider will recommend vaccines for you based on your age, medical history, and lifestyle or other factors, such as travel or where you work. What tests do I need? Screening Your health care provider may recommend screening tests for certain conditions. This may include: Lipid and cholesterol levels. Diabetes screening. This is done by checking your blood sugar (glucose) after you have not eaten for a while (fasting). Hepatitis C test. Hepatitis B test. HIV (human  immunodeficiency virus) test. STI (sexually transmitted infection) testing, if you are at risk. Lung cancer screening. Colorectal cancer screening. Prostate cancer screening. Abdominal aortic aneurysm (AAA) screening. You may need this if you are a current or former smoker. Talk with your health care provider about your test results, treatment options, and if necessary, the need for more tests. Follow these instructions at home: Eating and drinking  Eat a diet that includes fresh fruits and vegetables, whole grains, lean protein, and low-fat dairy products. Limit your intake of foods with high amounts of sugar, saturated fats, and salt. Take vitamin and mineral supplements as recommended by your health care provider. Do not drink alcohol if your health care provider tells you not to drink. If you drink alcohol: Limit how much you have to 0-2 drinks a day. Know how much alcohol is in your drink. In the [place] one drink equals one 12 oz bottle of beer (355 mL), one 5 oz glass of wine (148 mL), or one 1 oz glass of hard liquor (44 mL). Lifestyle Brush your teeth every morning and night with fluoride toothpaste. Floss one time each day. Exercise for at least 30 minutes 5 or more days each week. Do not use any products that contain nicotine or tobacco. These products include cigarettes, chewing tobacco, and vaping devices, such as e-cigarettes. If you need help quitting, ask your health care provider. Do not use drugs. If you are sexually active, practice safe sex. Use a condom or other form of protection to prevent STIs. Take aspirin only as told by your health care provider. Make sure that you understand how much to take and what form to take. Work with your health care provider to find out whether it is safe   and beneficial for you to take aspirin daily. Ask your health care provider if you need to take a cholesterol-lowering medicine (statin). Find healthy ways to manage stress, such  as: Meditation, yoga, or listening to music. Journaling. Talking to a trusted person. Spending time with friends and family. Safety Always wear your seat belt while driving or riding in a vehicle. Do not drive: If you have been drinking alcohol. Do not ride with someone who has been drinking. When you are tired or distracted. While texting. If you have been using any mind-altering substances or drugs. Wear a helmet and other protective equipment during sports activities. If you have firearms in your house, make sure you follow all gun safety procedures. Minimize exposure to UV radiation to reduce your risk of skin cancer. What's next? Visit your health care provider once a year for an annual wellness visit. Ask your health care provider how often you should have your eyes and teeth checked. Stay up to date on all vaccines. This information is not intended to replace advice given to you by your health care provider. Make sure you discuss any questions you have with your health care provider. Document Revised: 09/23/2020 Document Reviewed: 09/23/2020 Elsevier Patient Education  2023 [employer] Inc[employer]  

## 2022-08-24 LAB — IRON,TIBC AND FERRITIN PANEL
%SAT: 23 % (calc) (ref 20–48)
Ferritin: 43 ng/mL (ref 24–380)
Iron: 71 ug/dL (ref 50–180)
TIBC: 311 mcg/dL (calc) (ref 250–425)

## 2022-08-25 DIAGNOSIS — M25562 Pain in left knee: Secondary | ICD-10-CM | POA: Diagnosis not present

## 2022-08-25 DIAGNOSIS — R262 Difficulty in walking, not elsewhere classified: Secondary | ICD-10-CM | POA: Diagnosis not present

## 2022-08-25 DIAGNOSIS — M1712 Unilateral primary osteoarthritis, left knee: Secondary | ICD-10-CM | POA: Diagnosis not present

## 2022-08-25 DIAGNOSIS — M25662 Stiffness of left knee, not elsewhere classified: Secondary | ICD-10-CM | POA: Diagnosis not present

## 2022-09-01 DIAGNOSIS — M25562 Pain in left knee: Secondary | ICD-10-CM | POA: Diagnosis not present

## 2022-09-01 DIAGNOSIS — M25662 Stiffness of left knee, not elsewhere classified: Secondary | ICD-10-CM | POA: Diagnosis not present

## 2022-09-01 DIAGNOSIS — R262 Difficulty in walking, not elsewhere classified: Secondary | ICD-10-CM | POA: Diagnosis not present

## 2022-09-01 DIAGNOSIS — M1712 Unilateral primary osteoarthritis, left knee: Secondary | ICD-10-CM | POA: Diagnosis not present

## 2022-09-13 ENCOUNTER — Other Ambulatory Visit: Payer: Self-pay | Admitting: Family Medicine

## 2022-09-13 DIAGNOSIS — E1149 Type 2 diabetes mellitus with other diabetic neurological complication: Secondary | ICD-10-CM

## 2022-09-15 DIAGNOSIS — M1712 Unilateral primary osteoarthritis, left knee: Secondary | ICD-10-CM | POA: Diagnosis not present

## 2022-09-15 DIAGNOSIS — M25562 Pain in left knee: Secondary | ICD-10-CM | POA: Diagnosis not present

## 2022-09-15 DIAGNOSIS — M25662 Stiffness of left knee, not elsewhere classified: Secondary | ICD-10-CM | POA: Diagnosis not present

## 2022-09-15 DIAGNOSIS — R262 Difficulty in walking, not elsewhere classified: Secondary | ICD-10-CM | POA: Diagnosis not present

## 2022-09-22 DIAGNOSIS — M25562 Pain in left knee: Secondary | ICD-10-CM | POA: Diagnosis not present

## 2022-09-22 DIAGNOSIS — M25662 Stiffness of left knee, not elsewhere classified: Secondary | ICD-10-CM | POA: Diagnosis not present

## 2022-09-22 DIAGNOSIS — R262 Difficulty in walking, not elsewhere classified: Secondary | ICD-10-CM | POA: Diagnosis not present

## 2022-09-22 DIAGNOSIS — M1712 Unilateral primary osteoarthritis, left knee: Secondary | ICD-10-CM | POA: Diagnosis not present

## 2022-09-23 ENCOUNTER — Ambulatory Visit: Payer: Medicare HMO | Admitting: Physician Assistant

## 2022-09-28 DIAGNOSIS — M25662 Stiffness of left knee, not elsewhere classified: Secondary | ICD-10-CM | POA: Diagnosis not present

## 2022-09-28 DIAGNOSIS — R262 Difficulty in walking, not elsewhere classified: Secondary | ICD-10-CM | POA: Diagnosis not present

## 2022-09-28 DIAGNOSIS — M25562 Pain in left knee: Secondary | ICD-10-CM | POA: Diagnosis not present

## 2022-09-28 DIAGNOSIS — M1712 Unilateral primary osteoarthritis, left knee: Secondary | ICD-10-CM | POA: Diagnosis not present

## 2022-10-26 NOTE — Progress Notes (Signed)
Pt was a no show for AWV.This encounter was created in error - please disregard.

## 2022-10-27 ENCOUNTER — Ambulatory Visit: Payer: Medicare HMO | Admitting: *Deleted

## 2022-10-27 DIAGNOSIS — Z0000 Encounter for general adult medical examination without abnormal findings: Secondary | ICD-10-CM

## 2022-10-27 NOTE — Progress Notes (Signed)
Subjective:   [name] [name] is a 88 y.o. male who presents for Medicare Annual/Subsequent preventive examination.  Visit Complete: Virtual  I connected with  [name] [name] on 10/27/22 by a audio enabled telemedicine application and verified that I am speaking with the correct person using two identifiers.  Patient Location: Home  Provider Location: Office/Clinic  I discussed the limitations of evaluation and management by telemedicine. The patient expressed understanding and agreed to proceed.   Review of Systems     Cardiac Risk Factors include: male gender;advanced age (>57men, >70 women);diabetes mellitus;dyslipidemia;hypertension     Objective:    Per patient no change in vitals since last visit, unable to obtain new vitals due to telehealth visit      10/27/2022    3:29 PM 10/22/2021    9:40 AM 10/19/2020   10:35 AM 09/02/2019   11:39 AM 06/27/2019   11:03 AM 06/18/2018    8:31 AM 06/13/2016    8:53 AM  Advanced Directives  Does Patient Have a Medical Advance Directive? Yes Yes Yes Yes Yes Yes Yes  Type of Estate agent of [place];Living will Healthcare Power of [place];Living will Healthcare Power of [place];Living will Living will  Living will;Healthcare Power of [employer] Power of [place];Living will  Does patient want to make changes to medical advance directive? No - Patient declined No - Patient declined  No - Patient declined   No - Patient declined  Copy of Healthcare Power of Attorney in Chart? No - copy requested No - copy requested No - copy requested    No - copy requested    Current Medications (verified) Outpatient Encounter Medications as of 10/27/2022  Medication Sig   acetaminophen (TYLENOL) 500 MG tablet Take 500 mg by mouth every 6 (six) hours as needed.   Ascorbic Acid (VITAMIN C) 1000 MG tablet Take 1,000 mg by mouth daily.   aspirin 81 MG chewable tablet Chew by mouth daily.   atorvastatin (LIPITOR) 20 MG tablet TAKE 1  TABLET EVERY DAY   b complex vitamins tablet Take 1 tablet by mouth daily.   cholecalciferol (VITAMIN D3) 25 MCG (1000 UNIT) tablet Take 1,000 Units by mouth daily.   Coenzyme Q10 (CO Q 10 PO) Take 50 mg by mouth daily.   docusate sodium (COLACE) 100 MG capsule Take 1 capsule (100 mg total) by mouth every 12 (twelve) hours.   finasteride (PROSCAR) 5 MG tablet Take 5 mg by mouth daily.   furosemide (LASIX) 20 MG tablet TAKE 1 TABLET EVERY OTHER DAY (NEED APPOINTMENT AND LABS)   gabapentin (NEURONTIN) 600 MG tablet Take 1 tablet (600 mg total) by mouth 3 (three) times daily.   glucose blood (ACCU-CHEK AVIVA PLUS) test strip 1 each by Other route daily. And lancets 1/day   hydrocortisone (ANUSOL-HC) 2.5 % rectal cream Place 1 Application rectally 2 (two) times daily.   latanoprost (XALATAN) 0.005 % ophthalmic solution Place 1 drop into both eyes at bedtime.   metFORMIN (GLUCOPHAGE-XR) 500 MG 24 hr tablet Take 1 tablet (500 mg total) by mouth in the morning and at bedtime.   mirtazapine (REMERON) 15 MG tablet TAKE 1 TABLET BY MOUTH EVERYDAY AT BEDTIME   omeprazole (PRILOSEC) 20 MG capsule Take 1 capsule (20 mg total) by mouth daily.   ramipril (ALTACE) 5 MG capsule Take 1 capsule (5 mg total) by mouth daily.   repaglinide (PRANDIN) 0.5 MG tablet Take 1 tablet (0.5 mg total) by mouth daily before breakfast.   tamsulosin (  FLOMAX) 0.4 MG CAPS capsule TAKE ONE CAPSULE BY MOUTH DAILY AT 7 PM   traMADol (ULTRAM) 50 MG tablet Take 1 tablet by mouth daily.   TRUE METRIX BLOOD GLUCOSE TEST test strip USE AS INSTRUCTED   TRUEplus Lancets 33G MISC USE AS DIRECTED TO CHECK BLOOD SUGAR UP TO TWO TIMES A DAY.   No facility-administered encounter medications on file as of 10/27/2022.    Allergies (verified) Patient has no known allergies.   History: Past Medical History:  Diagnosis Date   Diabetes mellitus 1955   type 2 - fasting 100-120   Diverticulosis 2009   GERD (gastroesophageal reflux disease)     Glaucoma    bilateral   History of shingles    Hyperlipidemia    5 yrs   Hypertension    Neuromuscular disorder (HCC)    due to shingles around abdomen area    Osteoarthritis    "knees" (01/31/2014)   Prostate cancer (HCC)    "no treatments; it's going away by itself w/the pills I take"   Past Surgical History:  Procedure Laterality Date   ANTERIOR CERVICAL DECOMP/DISCECTOMY FUSION N/A 09/03/2015   Procedure: ANTERIOR CERVICAL DECOMPRESSION FUSION, CERVICAL 5-6 WITH INSTRUMENTATION AND ALLOGRAFT;  Surgeon: Estill Bamberg, MD;  Location: [facility] OR;  Service: Orthopedics;  Laterality: N/A;  ANTERIOR CERVICAL DECOMPRESSION FUSION, CERVICAL 5-6 WITH INSTRUMENTATION AND ALLOGRAFT   COLONOSCOPY     FINE NEEDLE ASPIRATION Left 06/10/15   Knee   FINE NEEDLE ASPIRATION  05/26/15   Back   JOINT REPLACEMENT     TONSILLECTOMY     TOTAL KNEE ARTHROPLASTY Right 01/31/2014   TOTAL KNEE ARTHROPLASTY Right 01/31/2014   Procedure: RIGHT TOTAL KNEE ARTHROPLASTY;  Surgeon: Nestor Lewandowsky, MD;  Location: [facility] OR;  Service: Orthopedics;  Laterality: Right;   Family History  Problem Relation Age of Onset   Arthritis Other    Diabetes Other    Cancer Other        prostate   Heart disease Brother        questionable   Colon cancer Daughter 23   Diabetes Mother    Colon polyps Neg Hx    Esophageal cancer Neg Hx    Rectal cancer Neg Hx    Stomach cancer Neg Hx    Social History   Socioeconomic History   Marital status: Widowed    Spouse name: Not on file   Number of children: Not on file   Years of education: Not on file   Highest education level: Not on file  Occupational History    Comment: retired Investment banker, operational     Comment: Psychiatrist  Tobacco Use   Smoking status: Former    Types: Cigars    Quit date: 04/12/1995    Years since quitting: 27.5   Smokeless tobacco: Never   Tobacco comments:    chews cigars still   Vaping Use   Vaping status: Never Used  Substance and Sexual Activity   Alcohol  use: No   Drug use: No   Sexual activity: Never  Other Topics Concern   Not on file  Social History Narrative   Works out in the garden   Social Determinants of Corporate investment banker Strain: Low Risk  (10/27/2022)   Overall Financial Resource Strain (CARDIA)    Difficulty of Paying Living Expenses: Not hard at all  Food Insecurity: No Food Insecurity (10/27/2022)   Hunger Vital Sign    Worried About Running Out of Food  in the Last Year: Never true    Ran Out of Food in the Last Year: Never true  Transportation Needs: No Transportation Needs (10/27/2022)   PRAPARE - Administrator, Civil Service (Medical): No    Lack of Transportation (Non-Medical): No  Physical Activity: Inactive (10/27/2022)   Exercise Vital Sign    Days of Exercise per Week: 0 days    Minutes of Exercise per Session: 0 min  Stress: No Stress Concern Present (10/27/2022)   [employer] of Occupational Health - Occupational Stress Questionnaire    Feeling of Stress : Not at all  Social Connections: Moderately Integrated (10/27/2022)   Social Connection and Isolation Panel [NHANES]    Frequency of Communication with Friends and Family: More than three times a week    Frequency of Social Gatherings with Friends and Family: Once a week    Attends Religious Services: More than 4 times per year    Active Member of [employer] or Organizations: Yes    Attends Banker Meetings: More than 4 times per year    Marital Status: Widowed    Tobacco Counseling Counseling given: Not Answered Tobacco comments: chews cigars still    Clinical Intake:  Pre-visit preparation completed: Yes  Pain : No/denies pain  Nutritional Risks: None Diabetes: Yes CBG done?: No Did pt. bring in CBG monitor from home?: No  How often do you need to have someone help you when you read instructions, pamphlets, or other written materials from your doctor or pharmacy?: 1 - Never  Interpreter Needed?:  No  Information entered by :: Donne Anon, CMA   Activities of Daily Living    10/27/2022    3:03 PM  In your present state of health, do you have any difficulty performing the following activities:  Hearing? 1  Comment wears hearing aids  Vision? 0  Difficulty concentrating or making decisions? 0  Walking or climbing stairs? 0  Dressing or bathing? 0  Doing errands, shopping? 0  Preparing Food and eating ? N  Using the Toilet? N  In the past six months, have you accidently leaked urine? N  Do you have problems with loss of bowel control? N  Managing your Medications? N  Managing your Finances? N  Housekeeping or managing your Housekeeping? N    Patient Care Team: Zola Button, Grayling Congress, DO as PCP - General (Family Medicine) Jerilee Field, MD as Consulting Physician (Urology) Chalmers Guest, MD as Consulting Physician (Ophthalmology) Gean Birchwood, MD as Consulting Physician (Orthopedic Surgery) Janetta Hora as Physician Assistant (Orthopedic Surgery) Claria Dice, MD as Attending Physician (Physical Medicine and Rehabilitation) Estill Bamberg, MD as Consulting Physician (Orthopedic Surgery) Romero Belling, MD (Inactive) as Consulting Physician (Endocrinology)  Indicate any recent Medical Services you may have received from other than [facility] providers in the past year (date may be approximate).     Assessment:   This is a routine wellness examination for [employer].  Hearing/Vision screen No results found.  Dietary issues and exercise activities discussed:     Goals Addressed   None    Depression Screen    10/27/2022    3:13 PM 07/28/2022   10:00 AM 10/22/2021    9:41 AM 10/19/2020   10:40 AM 09/02/2019   11:43 AM 06/27/2019   11:03 AM 05/31/2018   11:31 AM  PHQ 2/9 Scores  PHQ - 2 Score 0 0 0 0 0 0 0  PHQ- 9 Score  0    Fall Risk    10/27/2022    3:08 PM 07/28/2022   10:00 AM 10/22/2021    9:40 AM 10/19/2020   10:37 AM 09/02/2019   11:42 AM  Fall  Risk   Falls in the past year? 0 0 0 0 0  Number falls in past yr: 0 0 0 0 0  Injury with Fall? 0 0 0 0 0  Risk for fall due to : No Fall Risks Impaired balance/gait History of fall(s)    Follow up Falls evaluation completed Falls evaluation completed Falls evaluation completed Falls prevention discussed Education provided;Falls prevention discussed    MEDICARE RISK AT HOME:  Medicare Risk at Home - 10/27/22 1505     Any stairs in or around the home? No    If so, are there any without handrails? No    Home free of loose throw rugs in walkways, pet beds, electrical cords, etc? Yes    Adequate lighting in your home to reduce risk of falls? Yes    Life alert? Yes   wears it when he's outside in the garden   Use of a cane, walker or w/c? Yes    Grab bars in the bathroom? Yes    Shower chair or bench in shower? Yes    Elevated toilet seat or a handicapped toilet? No             TIMED UP AND GO:  Was the test performed?  No    Cognitive Function:    06/13/2016    8:55 AM  MMSE - Mini Mental State Exam  Orientation to time 5  Orientation to Place 5  Registration 3  Attention/ Calculation 5  Recall 3  Language- name 2 objects 2  Language- repeat 1  Language- follow 3 step command 3  Language- read & follow direction 1  Write a sentence 1  Copy design 1  Total score 30        10/27/2022    3:13 PM 10/22/2021    9:53 AM  6CIT Screen  What Year? 4 points 0 points  What month? 3 points 0 points  What time? 0 points 0 points  Count back from 20 0 points 0 points  Months in reverse 2 points 4 points  Repeat phrase 10 points 6 points  Total Score 19 points 10 points    Immunizations Immunization History  Administered Date(s) Administered   Fluad Quad(high Dose 65+) 12/04/2018, 12/26/2019, 04/29/2021, 01/07/2022   Influenza Split 12/21/2011   Influenza Whole 12/19/2007, 01/16/2009, 01/27/2010   Influenza, High Dose Seasonal PF 12/15/2014, 12/24/2016, 02/21/2018    Influenza-Unspecified 01/01/2014, 12/24/2016   Moderna Sars-Covid-2 Vaccination 06/01/2019, 06/29/2019, 02/04/2020, 07/21/2020   Pneumococcal Conjugate-13 05/01/2014, 09/16/2014   Pneumococcal Polysaccharide-23 12/19/2007, 04/25/2019   Td 10/16/2007   Zoster Recombinant(Shingrix) 07/06/2016, 10/05/2016    TDAP status: Due, Education has been provided regarding the importance of this vaccine. Advised may receive this vaccine at local pharmacy or Health Dept. Aware to provide a copy of the vaccination record if obtained from local pharmacy or Health Dept. Verbalized acceptance and understanding.  Flu Vaccine status: Up to date  Pneumococcal vaccine status: Up to date  Covid-19 vaccine status: Information provided on how to obtain vaccines.   Qualifies for Shingles Vaccine? Yes   Zostavax completed No   Shingrix Completed?: Yes  Screening Tests Health Maintenance  Topic Date Due   DTaP/Tdap/Td (2 - Tdap) 10/15/2017   OPHTHALMOLOGY EXAM  10/27/2020  COVID-19 Vaccine (5 - 2023-24 season) 12/10/2021   Medicare Annual Wellness (AWV)  10/23/2022   FOOT EXAM  10/22/2022   HEMOGLOBIN A1C  10/24/2022   INFLUENZA VACCINE  11/10/2022   Pneumonia Vaccine 26+ Years old  Completed   Zoster Vaccines- Shingrix  Completed   HPV VACCINES  Aged Out    Health Maintenance  Health Maintenance Due  Topic Date Due   DTaP/Tdap/Td (2 - Tdap) 10/15/2017   OPHTHALMOLOGY EXAM  10/27/2020   COVID-19 Vaccine (5 - 2023-24 season) 12/10/2021   Medicare Annual Wellness (AWV)  10/23/2022   FOOT EXAM  10/22/2022   HEMOGLOBIN A1C  10/24/2022    Colorectal cancer screening: No longer required.   Lung Cancer Screening: (Low Dose CT Chest recommended if Age 52-80 years, 20 pack-year currently smoking OR have quit w/in 15years.) does not qualify.   Additional Screening:  Hepatitis C Screening: does not qualify  Vision Screening: Recommended annual ophthalmology exams for early detection of glaucoma and  other disorders of the eye. Is the patient up to date with their annual eye exam?  Yes  Who is the provider or what is the name of the office in which the patient attends annual eye exams? Dr. Chalmers Guest If pt is not established with a provider, would they like to be referred to a provider to establish care? No .   Dental Screening: Recommended annual dental exams for proper oral hygiene  Diabetic Foot Exam: Diabetic Foot Exam: Overdue, Pt has been advised about the importance in completing this exam. Pt is scheduled for diabetic foot exam on N/a.  Community Resource Referral / Chronic Care Management: CRR required this visit?  No   CCM required this visit?  No     Plan:     I have personally reviewed and noted the following in the patient's chart:   Medical and social history Use of alcohol, tobacco or illicit drugs  Current medications and supplements including opioid prescriptions. Patient is currently taking opioid prescriptions. Information provided to patient regarding non-opioid alternatives. Patient advised to discuss non-opioid treatment plan with their provider. Functional ability and status Nutritional status Physical activity Advanced directives List of other physicians Hospitalizations, surgeries, and ER visits in previous 12 months Vitals Screenings to include cognitive, depression, and falls Referrals and appointments  In addition, I have reviewed and discussed with patient certain preventive protocols, quality metrics, and best practice recommendations. A written personalized care plan for preventive services as well as general preventive health recommendations were provided to patient.     Donne Anon, CMA   10/27/2022   After Visit Summary: (Declined) Due to this being a telephonic visit, with patients personalized plan was offered to patient but patient Declined AVS at this time   Nurse Notes: None

## 2022-10-27 NOTE — Patient Instructions (Signed)
[name] [name] , Thank you for taking time to come for your Medicare Wellness Visit. I appreciate your ongoing commitment to your health goals. Please review the following plan we discussed and let me know if I can assist you in the future.   These are the goals we discussed:  Goals      Maintain current health        This is a list of the screening recommended for you and due dates:  Health Maintenance  Topic Date Due   DTaP/Tdap/Td vaccine (2 - Tdap) 10/15/2017   Eye exam for diabetics  10/27/2020   COVID-19 Vaccine (5 - 2023-24 season) 12/10/2021   Complete foot exam   10/22/2022   Hemoglobin A1C  10/24/2022   Flu Shot  11/10/2022   Medicare Annual Wellness Visit  10/27/2023   Pneumonia Vaccine  Completed   Zoster (Shingles) Vaccine  Completed   HPV Vaccine  Aged Out     Next appointment: Follow up in one year for your annual wellness visit.   Preventive Care 86 Years and Older, Male Preventive care refers to lifestyle choices and visits with your health care provider that can promote health and wellness. What does preventive care include? A yearly physical exam. This is also called an annual well check. Dental exams once or twice a year. Routine eye exams. Ask your health care provider how often you should have your eyes checked. Personal lifestyle choices, including: Daily care of your teeth and gums. Regular physical activity. Eating a healthy diet. Avoiding tobacco and drug use. Limiting alcohol use. Practicing safe sex. Taking low doses of aspirin every day. Taking vitamin and mineral supplements as recommended by your health care provider. What happens during an annual well check? The services and screenings done by your health care provider during your annual well check will depend on your age, overall health, lifestyle risk factors, and family history of disease. Counseling  Your health care provider may ask you questions about your: Alcohol use. Tobacco  use. Drug use. Emotional well-being. Home and relationship well-being. Sexual activity. Eating habits. History of falls. Memory and ability to understand (cognition). Work and work Astronomer. Screening  You may have the following tests or measurements: Height, weight, and BMI. Blood pressure. Lipid and cholesterol levels. These may be checked every 5 years, or more frequently if you are over 55 years old. Skin check. Lung cancer screening. You may have this screening every year starting at age 86 if you have a 30-pack-year history of smoking and currently smoke or have quit within the past 15 years. Fecal occult blood test (FOBT) of the stool. You may have this test every year starting at age 86. Flexible sigmoidoscopy or colonoscopy. You may have a sigmoidoscopy every 5 years or a colonoscopy every 10 years starting at age 86. Prostate cancer screening. Recommendations will vary depending on your family history and other risks. Hepatitis C blood test. Hepatitis B blood test. Sexually transmitted disease (STD) testing. Diabetes screening. This is done by checking your blood sugar (glucose) after you have not eaten for a while (fasting). You may have this done every 1-3 years. Abdominal aortic aneurysm (AAA) screening. You may need this if you are a current or former smoker. Osteoporosis. You may be screened starting at age 86 if you are at high risk. Talk with your health care provider about your test results, treatment options, and if necessary, the need for more tests. Vaccines  Your health care provider may recommend  certain vaccines, such as: Influenza vaccine. This is recommended every year. Tetanus, diphtheria, and acellular pertussis (Tdap, Td) vaccine. You may need a Td booster every 10 years. Zoster vaccine. You may need this after age 86. Pneumococcal 13-valent conjugate (PCV13) vaccine. One dose is recommended after age 86. Pneumococcal polysaccharide (PPSV23) vaccine.  One dose is recommended after age 38. Talk to your health care provider about which screenings and vaccines you need and how often you need them. This information is not intended to replace advice given to you by your health care provider. Make sure you discuss any questions you have with your health care provider. Document Released: 04/24/2015 Document Revised: 12/16/2015 Document Reviewed: 01/27/2015 Elsevier Interactive Patient Education  2017 [employer].  Fall Prevention in the Home Falls can cause injuries. They can happen to people of all ages. There are many things you can do to make your home safe and to help prevent falls. What can I do on the outside of my home? Regularly fix the edges of walkways and driveways and fix any cracks. Remove anything that might make you trip as you walk through a door, such as a raised step or threshold. Trim any bushes or trees on the path to your home. Use bright outdoor lighting. Clear any walking paths of anything that might make someone trip, such as rocks or tools. Regularly check to see if handrails are loose or broken. Make sure that both sides of any steps have handrails. Any raised decks and porches should have guardrails on the edges. Have any leaves, snow, or ice cleared regularly. Use sand or salt on walking paths during winter. Clean up any spills in your garage right away. This includes oil or grease spills. What can I do in the bathroom? Use night lights. Install grab bars by the toilet and in the tub and shower. Do not use towel bars as grab bars. Use non-skid mats or decals in the tub or shower. If you need to sit down in the shower, use a plastic, non-slip stool. Keep the floor dry. Clean up any water that spills on the floor as soon as it happens. Remove soap buildup in the tub or shower regularly. Attach bath mats securely with double-sided non-slip rug tape. Do not have throw rugs and other things on the floor that can make  you trip. What can I do in the bedroom? Use night lights. Make sure that you have a light by your bed that is easy to reach. Do not use any sheets or blankets that are too big for your bed. They should not hang down onto the floor. Have a firm chair that has side arms. You can use this for support while you get dressed. Do not have throw rugs and other things on the floor that can make you trip. What can I do in the kitchen? Clean up any spills right away. Avoid walking on wet floors. Keep items that you use a lot in easy-to-reach places. If you need to reach something above you, use a strong step stool that has a grab bar. Keep electrical cords out of the way. Do not use floor polish or wax that makes floors slippery. If you must use wax, use non-skid floor wax. Do not have throw rugs and other things on the floor that can make you trip. What can I do with my stairs? Do not leave any items on the stairs. Make sure that there are handrails on both sides of the  stairs and use them. Fix handrails that are broken or loose. Make sure that handrails are as long as the stairways. Check any carpeting to make sure that it is firmly attached to the stairs. Fix any carpet that is loose or worn. Avoid having throw rugs at the top or bottom of the stairs. If you do have throw rugs, attach them to the floor with carpet tape. Make sure that you have a light switch at the top of the stairs and the bottom of the stairs. If you do not have them, ask someone to add them for you. What else can I do to help prevent falls? Wear shoes that: Do not have high heels. Have rubber bottoms. Are comfortable and fit you well. Are closed at the toe. Do not wear sandals. If you use a stepladder: Make sure that it is fully opened. Do not climb a closed stepladder. Make sure that both sides of the stepladder are locked into place. Ask someone to hold it for you, if possible. Clearly mark and make sure that you can  see: Any grab bars or handrails. First and last steps. Where the edge of each step is. Use tools that help you move around (mobility aids) if they are needed. These include: Canes. Walkers. Scooters. Crutches. Turn on the lights when you go into a dark area. Replace any light bulbs as soon as they burn out. Set up your furniture so you have a clear path. Avoid moving your furniture around. If any of your floors are uneven, fix them. If there are any pets around you, be aware of where they are. Review your medicines with your doctor. Some medicines can make you feel dizzy. This can increase your chance of falling. Ask your doctor what other things that you can do to help prevent falls. This information is not intended to replace advice given to you by your health care provider. Make sure you discuss any questions you have with your health care provider. Document Released: 01/22/2009 Document Revised: 09/03/2015 Document Reviewed: 05/02/2014 Elsevier Interactive Patient Education  2017 [employer].

## 2022-11-15 DIAGNOSIS — H401131 Primary open-angle glaucoma, bilateral, mild stage: Secondary | ICD-10-CM | POA: Diagnosis not present

## 2022-11-15 DIAGNOSIS — E119 Type 2 diabetes mellitus without complications: Secondary | ICD-10-CM | POA: Diagnosis not present

## 2022-11-15 DIAGNOSIS — H2513 Age-related nuclear cataract, bilateral: Secondary | ICD-10-CM | POA: Diagnosis not present

## 2022-12-06 ENCOUNTER — Other Ambulatory Visit: Payer: Self-pay | Admitting: Family Medicine

## 2022-12-06 DIAGNOSIS — R634 Abnormal weight loss: Secondary | ICD-10-CM

## 2022-12-07 NOTE — Telephone Encounter (Signed)
Last refill 07/07/22 #90 +1 refill Last office visit 08/23/22

## 2022-12-20 DIAGNOSIS — H2513 Age-related nuclear cataract, bilateral: Secondary | ICD-10-CM | POA: Diagnosis not present

## 2022-12-20 DIAGNOSIS — H401131 Primary open-angle glaucoma, bilateral, mild stage: Secondary | ICD-10-CM | POA: Diagnosis not present

## 2022-12-20 DIAGNOSIS — E119 Type 2 diabetes mellitus without complications: Secondary | ICD-10-CM | POA: Diagnosis not present

## 2023-03-02 DIAGNOSIS — R351 Nocturia: Secondary | ICD-10-CM | POA: Diagnosis not present

## 2023-03-02 DIAGNOSIS — C61 Malignant neoplasm of prostate: Secondary | ICD-10-CM | POA: Diagnosis not present

## 2023-03-02 DIAGNOSIS — N401 Enlarged prostate with lower urinary tract symptoms: Secondary | ICD-10-CM | POA: Diagnosis not present

## 2023-03-10 ENCOUNTER — Other Ambulatory Visit: Payer: Self-pay | Admitting: Family Medicine

## 2023-03-10 DIAGNOSIS — E1149 Type 2 diabetes mellitus with other diabetic neurological complication: Secondary | ICD-10-CM

## 2023-03-11 ENCOUNTER — Encounter: Payer: Self-pay | Admitting: *Deleted

## 2023-03-22 DIAGNOSIS — H401131 Primary open-angle glaucoma, bilateral, mild stage: Secondary | ICD-10-CM | POA: Diagnosis not present

## 2023-03-22 DIAGNOSIS — E119 Type 2 diabetes mellitus without complications: Secondary | ICD-10-CM | POA: Diagnosis not present

## 2023-03-22 DIAGNOSIS — H2513 Age-related nuclear cataract, bilateral: Secondary | ICD-10-CM | POA: Diagnosis not present

## 2023-03-23 ENCOUNTER — Encounter: Payer: Self-pay | Admitting: Family Medicine

## 2023-03-23 ENCOUNTER — Ambulatory Visit: Payer: Medicare HMO | Admitting: Family Medicine

## 2023-03-23 VITALS — BP 118/78 | HR 78 | Temp 98.2°F | Resp 18 | Ht 65.0 in | Wt 150.8 lb

## 2023-03-23 DIAGNOSIS — E1169 Type 2 diabetes mellitus with other specified complication: Secondary | ICD-10-CM | POA: Diagnosis not present

## 2023-03-23 DIAGNOSIS — D509 Iron deficiency anemia, unspecified: Secondary | ICD-10-CM

## 2023-03-23 DIAGNOSIS — Z23 Encounter for immunization: Secondary | ICD-10-CM | POA: Diagnosis not present

## 2023-03-23 DIAGNOSIS — E1151 Type 2 diabetes mellitus with diabetic peripheral angiopathy without gangrene: Secondary | ICD-10-CM

## 2023-03-23 DIAGNOSIS — I10 Essential (primary) hypertension: Secondary | ICD-10-CM | POA: Diagnosis not present

## 2023-03-23 DIAGNOSIS — E1149 Type 2 diabetes mellitus with other diabetic neurological complication: Secondary | ICD-10-CM | POA: Diagnosis not present

## 2023-03-23 DIAGNOSIS — E785 Hyperlipidemia, unspecified: Secondary | ICD-10-CM | POA: Diagnosis not present

## 2023-03-23 LAB — CBC WITH DIFFERENTIAL/PLATELET
Basophils Absolute: 0 10*3/uL (ref 0.0–0.1)
Basophils Relative: 0.4 % (ref 0.0–3.0)
Eosinophils Absolute: 0.1 10*3/uL (ref 0.0–0.7)
Eosinophils Relative: 3.3 % (ref 0.0–5.0)
HCT: 32.9 % — ABNORMAL LOW (ref 39.0–52.0)
Hemoglobin: 10.8 g/dL — ABNORMAL LOW (ref 13.0–17.0)
Lymphocytes Relative: 30.7 % (ref 12.0–46.0)
Lymphs Abs: 1.2 10*3/uL (ref 0.7–4.0)
MCHC: 32.8 g/dL (ref 30.0–36.0)
MCV: 98.4 fL (ref 78.0–100.0)
Monocytes Absolute: 0.3 10*3/uL (ref 0.1–1.0)
Monocytes Relative: 7.7 % (ref 3.0–12.0)
Neutro Abs: 2.3 10*3/uL (ref 1.4–7.7)
Neutrophils Relative %: 57.9 % (ref 43.0–77.0)
Platelets: 361 10*3/uL (ref 150.0–400.0)
RBC: 3.34 Mil/uL — ABNORMAL LOW (ref 4.22–5.81)
RDW: 13 % (ref 11.5–15.5)
WBC: 4 10*3/uL (ref 4.0–10.5)

## 2023-03-23 LAB — COMPREHENSIVE METABOLIC PANEL
ALT: 6 U/L (ref 0–53)
AST: 10 U/L (ref 0–37)
Albumin: 3.8 g/dL (ref 3.5–5.2)
Alkaline Phosphatase: 82 U/L (ref 39–117)
BUN: 15 mg/dL (ref 6–23)
CO2: 29 meq/L (ref 19–32)
Calcium: 9.1 mg/dL (ref 8.4–10.5)
Chloride: 106 meq/L (ref 96–112)
Creatinine, Ser: 1.26 mg/dL (ref 0.40–1.50)
GFR: 51.63 mL/min — ABNORMAL LOW (ref >60.00)
Glucose, Bld: 90 mg/dL (ref 70–99)
Potassium: 4.7 meq/L (ref 3.5–5.1)
Sodium: 141 meq/L (ref 135–145)
Total Bilirubin: 0.4 mg/dL (ref 0.2–1.2)
Total Protein: 6.3 g/dL (ref 6.0–8.3)

## 2023-03-23 LAB — HEMOGLOBIN A1C: Hgb A1c MFr Bld: 7 % — ABNORMAL HIGH (ref 4.6–6.5)

## 2023-03-23 LAB — TSH: TSH: 1.17 u[IU]/mL (ref 0.35–5.50)

## 2023-03-23 LAB — LIPID PANEL
Cholesterol: 167 mg/dL (ref 0–200)
HDL: 34 mg/dL — ABNORMAL LOW (ref >39.00)
LDL Cholesterol: 108 mg/dL — ABNORMAL HIGH (ref 0–99)
NonHDL: 133.3
Total CHOL/HDL Ratio: 5
Triglycerides: 125 mg/dL (ref 0.0–149.0)
VLDL: 25 mg/dL (ref 0.0–40.0)

## 2023-03-23 LAB — MICROALBUMIN / CREATININE URINE RATIO
Creatinine,U: 161.9 mg/dL
Microalb Creat Ratio: 0.7 mg/g (ref 0.0–30.0)
Microalb, Ur: 1.1 mg/dL (ref 0.0–1.9)

## 2023-03-23 NOTE — Progress Notes (Signed)
lipi  Established Patient Office Visit  Subjective   Patient ID: [name] [name], male    DOB: 12-09-1934  Age: 88 y.o. MRN: [id number]  Chief Complaint  Patient presents with   Hyperlipidemia   Hypertension   Follow-up    HPI Discussed the use of AI scribe software for clinical note transcription with the patient, who gave verbal consent to proceed.  History of Present Illness   The patient, an 88 year old with a history of glaucoma, reports a generally good state of health. He recently had an eye doctor appointment and received new eye drops for his glaucoma. He is scheduled for a follow-up in three months. The patient denies needing any medication refills, having recently received them. He has not received a flu shot this year but expressed a desire to get one.  The patient reports no issues with his feet and denies any problems with sensation at the bottom of both feet. He has been maintaining an active lifestyle, including travel to [place] and potentially [place] for the holiday season.  The patient lives alone in a large house and has expressed some dissatisfaction with this arrangement, hinting at a possible move to [place]. His closest family is in [place]. He moved to his current location due to his brother, but it is unclear if the brother is still in the picture.  The patient denies having any questions or concerns and reports feeling well overall.      Patient Active Problem List   Diagnosis Date Noted   Anemia 08/23/2022   Preventative health care 08/23/2022   External hemorrhoid 07/28/2022   Rectal pain 07/28/2022   Type 2 diabetes mellitus with diabetic polyneuropathy, without long-term current use of insulin (HCC) 10/21/2021   Prostate cancer (HCC) 04/29/2021   Iron deficiency anemia 12/29/2019   Hyperlipidemia associated with type 2 diabetes mellitus (HCC) 04/25/2019   Glaucoma 08/22/2017   Essential hypertension 12/26/2016   Hyperlipidemia LDL goal <70 12/26/2016    Myelopathy of cervical spinal cord with cervical radiculopathy (HCC) 09/03/2015   Arthritis of knee 01/31/2014   Hematuria 12/13/2011   PHN (postherpetic neuralgia) 12/13/2011   Diverticulitis 11/30/2011   Low back pain 08/16/2011   DM (diabetes mellitus) type II, controlled, with peripheral vascular disorder (HCC) 01/17/2008   Pure hypercholesterolemia 01/17/2008   HTN, goal below 140/90 01/17/2008   Osteoarthritis 01/17/2008   ELEVATED PROSTATE SPECIFIC ANTIGEN 01/17/2008   Past Medical History:  Diagnosis Date   Diabetes mellitus 1955   type 2 - fasting 100-120   Diverticulosis 2009   GERD (gastroesophageal reflux disease)    Glaucoma    bilateral   History of shingles    Hyperlipidemia    5 yrs   Hypertension    Neuromuscular disorder (HCC)    due to shingles around abdomen area    Osteoarthritis    "knees" (01/31/2014)   Prostate cancer (HCC)    "no treatments; it's going away by itself w/the pills I take"   Past Surgical History:  Procedure Laterality Date   ANTERIOR CERVICAL DECOMP/DISCECTOMY FUSION N/A 09/03/2015   Procedure: ANTERIOR CERVICAL DECOMPRESSION FUSION, CERVICAL 5-6 WITH INSTRUMENTATION AND ALLOGRAFT;  Surgeon: Estill Bamberg, MD;  Location: [facility] OR;  Service: Orthopedics;  Laterality: N/A;  ANTERIOR CERVICAL DECOMPRESSION FUSION, CERVICAL 5-6 WITH INSTRUMENTATION AND ALLOGRAFT   COLONOSCOPY     FINE NEEDLE ASPIRATION Left 06/10/15   Knee   FINE NEEDLE ASPIRATION  05/26/15   Back   JOINT REPLACEMENT     TONSILLECTOMY  TOTAL KNEE ARTHROPLASTY Right 01/31/2014   TOTAL KNEE ARTHROPLASTY Right 01/31/2014   Procedure: RIGHT TOTAL KNEE ARTHROPLASTY;  Surgeon: Nestor Lewandowsky, MD;  Location: [facility] OR;  Service: Orthopedics;  Laterality: Right;   Social History   Tobacco Use   Smoking status: Former    Types: Cigars    Quit date: 04/12/1995    Years since quitting: 27.9   Smokeless tobacco: Never   Tobacco comments:    chews cigars still   Vaping Use    Vaping status: Never Used  Substance Use Topics   Alcohol use: No   Drug use: No   Social History   Socioeconomic History   Marital status: Widowed    Spouse name: Not on file   Number of children: Not on file   Years of education: Not on file   Highest education level: Not on file  Occupational History    Comment: retired Investment banker, operational     Comment: Psychiatrist  Tobacco Use   Smoking status: Former    Types: Cigars    Quit date: 04/12/1995    Years since quitting: 27.9   Smokeless tobacco: Never   Tobacco comments:    chews cigars still   Vaping Use   Vaping status: Never Used  Substance and Sexual Activity   Alcohol use: No   Drug use: No   Sexual activity: Never  Other Topics Concern   Not on file  Social History Narrative   Works out in the garden   Social Drivers of Corporate investment banker Strain: Low Risk  (10/27/2022)   Overall Financial Resource Strain (CARDIA)    Difficulty of Paying Living Expenses: Not hard at all  Food Insecurity: No Food Insecurity (10/27/2022)   Hunger Vital Sign    Worried About Running Out of Food in the Last Year: Never true    Ran Out of Food in the Last Year: Never true  Transportation Needs: No Transportation Needs (10/27/2022)   PRAPARE - Administrator, Civil Service (Medical): No    Lack of Transportation (Non-Medical): No  Physical Activity: Inactive (10/27/2022)   Exercise Vital Sign    Days of Exercise per Week: 0 days    Minutes of Exercise per Session: 0 min  Stress: No Stress Concern Present (10/27/2022)   [employer] of Occupational Health - Occupational Stress Questionnaire    Feeling of Stress : Not at all  Social Connections: Moderately Integrated (10/27/2022)   Social Connection and Isolation Panel [NHANES]    Frequency of Communication with Friends and Family: More than three times a week    Frequency of Social Gatherings with Friends and Family: Once a week    Attends Religious Services: More than 4  times per year    Active Member of [employer] or Organizations: Yes    Attends Banker Meetings: More than 4 times per year    Marital Status: Widowed  Intimate Partner Violence: Not At Risk (10/27/2022)   Humiliation, Afraid, Rape, and Kick questionnaire    Fear of Current or Ex-Partner: No    Emotionally Abused: No    Physically Abused: No    Sexually Abused: No   Family Status  Relation Name Status   Other family hx of Alive   Brother  (Not Specified)   Daughter  Deceased at age 13   Mother  (Not Specified)   Neg Hx  (Not Specified)  No partnership data on file   Family History  Problem Relation Age of Onset   Arthritis Other    Diabetes Other    Cancer Other        prostate   Heart disease Brother        questionable   Colon cancer Daughter 46   Diabetes Mother    Colon polyps Neg Hx    Esophageal cancer Neg Hx    Rectal cancer Neg Hx    Stomach cancer Neg Hx    No Known Allergies    Review of Systems  Constitutional:  Negative for chills, fever and malaise/fatigue.  HENT:  Negative for congestion and hearing loss.   Eyes:  Negative for blurred vision and discharge.  Respiratory:  Negative for cough, sputum production and shortness of breath.   Cardiovascular:  Negative for chest pain, palpitations and leg swelling.  Gastrointestinal:  Negative for abdominal pain, blood in stool, constipation, diarrhea, heartburn, nausea and vomiting.  Genitourinary:  Negative for dysuria, frequency, hematuria and urgency.  Musculoskeletal:  Negative for back pain, falls and myalgias.  Skin:  Negative for rash.  Neurological:  Negative for dizziness, sensory change, loss of consciousness, weakness and headaches.  Endo/Heme/Allergies:  Negative for environmental allergies. Does not bruise/bleed easily.  Psychiatric/Behavioral:  Negative for depression and suicidal ideas. The patient is not nervous/anxious and does not have insomnia.       Objective:     BP 118/78 (BP  Location: Left Arm, Patient Position: Sitting, Cuff Size: Normal)   Pulse 78   Temp 98.2 F (36.8 C) (Oral)   Resp 18   Ht 5\' 5"  (1.651 m)   Wt 150 lb 12.8 oz (68.4 kg)   SpO2 97%   BMI 25.09 kg/m  BP Readings from Last 3 Encounters:  03/23/23 118/78  08/23/22 (!) 150/90  08/02/22 (!) 176/83   Wt Readings from Last 3 Encounters:  03/23/23 150 lb 12.8 oz (68.4 kg)  08/23/22 161 lb 6.4 oz (73.2 kg)  07/28/22 159 lb 6.4 oz (72.3 kg)   SpO2 Readings from Last 3 Encounters:  03/23/23 97%  08/23/22 99%  08/02/22 97%      Physical Exam Vitals and nursing note reviewed.  Constitutional:      General: He is not in acute distress.    Appearance: Normal appearance. He is well-developed.  HENT:     Head: Normocephalic and atraumatic.  Eyes:     General: No scleral icterus.       Right eye: No discharge.        Left eye: No discharge.  Cardiovascular:     Rate and Rhythm: Normal rate and regular rhythm.     Heart sounds: No murmur heard. Pulmonary:     Effort: Pulmonary effort is normal. No respiratory distress.     Breath sounds: Normal breath sounds.  Musculoskeletal:        General: Normal range of motion.     Cervical back: Normal range of motion and neck supple.     Right lower leg: No edema.     Left lower leg: No edema.  Skin:    General: Skin is warm and dry.  Neurological:     Mental Status: He is alert and oriented to person, place, and time.  Psychiatric:        Mood and Affect: Mood normal.        Behavior: Behavior normal.        Thought Content: Thought content normal.        Judgment: Judgment  normal.      No results found for any visits on 03/23/23.  Last CBC Lab Results  Component Value Date   WBC 4.2 08/23/2022   HGB 11.2 (L) 08/23/2022   HCT 33.6 (L) 08/23/2022   MCV 96.4 08/23/2022   MCH 32.8 12/26/2019   RDW 14.0 08/23/2022   PLT 274.0 08/23/2022   Last metabolic panel Lab Results  Component Value Date   GLUCOSE 154 (H) 07/28/2022    NA 139 07/28/2022   K 4.3 07/28/2022   CL 104 07/28/2022   CO2 28 07/28/2022   BUN 12 07/28/2022   CREATININE 1.14 07/28/2022   GFR 58.49 (L) 07/28/2022   CALCIUM 8.7 07/28/2022   PROT 6.2 07/28/2022   ALBUMIN 4.0 07/28/2022   BILITOT 0.5 07/28/2022   ALKPHOS 66 07/28/2022   AST 13 07/28/2022   ALT 8 07/28/2022   ANIONGAP 5 08/31/2015   Last lipids Lab Results  Component Value Date   CHOL 205 (H) 07/28/2022   HDL 49.50 07/28/2022   LDLCALC 136 (H) 07/28/2022   TRIG 96.0 07/28/2022   CHOLHDL 4 07/28/2022   Last hemoglobin A1c Lab Results  Component Value Date   HGBA1C 7.1 (A) 04/25/2022   Last thyroid functions Lab Results  Component Value Date   TSH 2.69 04/22/2021   T4TOTAL 10.5 12/26/2019   Last vitamin D No results found for: "25OHVITD2", "25OHVITD3", "VD25OH" Last vitamin B12 and Folate Lab Results  Component Value Date   VITAMINB12 622 08/16/2011   FOLATE >24.8 08/16/2011      The ASCVD Risk score (Arnett DK, et al., 2019) failed to calculate for the following reasons:   The 2019 ASCVD risk score is only valid for ages 59 to 52    Assessment & Plan:   Problem List Items Addressed This Visit       Unprioritized   DM (diabetes mellitus) type II, controlled, with peripheral vascular disorder (HCC)   Relevant Orders   Hemoglobin A1c   Microalbumin / creatinine urine ratio   Iron deficiency anemia   Relevant Orders   CBC with Differential/Platelet   Comprehensive metabolic panel   Hyperlipidemia associated with type 2 diabetes mellitus (HCC)   Tolerating statin, encouraged heart healthy diet, avoid trans fats, minimize simple carbs and saturated fats. Increase exercise as tolerated       Relevant Orders   Comprehensive metabolic panel   Lipid panel   Essential hypertension - Primary   Well controlled, no changes to meds. Encouraged heart healthy diet such as the DASH diet and exercise as tolerated.        Relevant Orders   CBC with  Differential/Platelet   Comprehensive metabolic panel   Lipid panel   TSH   Other Visit Diagnoses       Need for influenza vaccination       Relevant Orders   Flu Vaccine Trivalent High Dose (Fluad)     Other diabetic neurological complication associated with type 2 diabetes mellitus (HCC)       Relevant Orders   Hemoglobin A1c   Microalbumin / creatinine urine ratio     Assessment and Plan    Glaucoma   He recently visited an ophthalmologist and was prescribed new eye drops. He will continue the current eye drops as prescribed and follow up with the ophthalmologist in three months.  Endocrinology Follow-Up   He has no scheduled follow-up with his endocrinologist, Dr. Eyvonne Mechanic. A follow-up appointment with Dr. Eyvonne Mechanic will be scheduled.  General Health Maintenance   He has not received a flu shot this year and expressed a desire to get one. The flu shot will be administered today.        Return in about 6 months (around 09/21/2023), or if symptoms worsen or fail to improve, for annual exam, fasting.    [name], DO

## 2023-03-23 NOTE — Assessment & Plan Note (Signed)
Tolerating statin, encouraged heart healthy diet, avoid trans fats, minimize simple carbs and saturated fats. Increase exercise as tolerated 

## 2023-03-23 NOTE — Assessment & Plan Note (Signed)
Well controlled, no changes to meds. Encouraged heart healthy diet such as the DASH diet and exercise as tolerated.  °

## 2023-03-23 NOTE — Patient Instructions (Signed)

## 2023-08-02 DIAGNOSIS — E119 Type 2 diabetes mellitus without complications: Secondary | ICD-10-CM | POA: Diagnosis not present

## 2023-08-02 DIAGNOSIS — H401131 Primary open-angle glaucoma, bilateral, mild stage: Secondary | ICD-10-CM | POA: Diagnosis not present

## 2023-08-02 DIAGNOSIS — H2513 Age-related nuclear cataract, bilateral: Secondary | ICD-10-CM | POA: Diagnosis not present

## 2023-08-24 ENCOUNTER — Other Ambulatory Visit: Payer: Self-pay | Admitting: Family Medicine

## 2023-08-24 DIAGNOSIS — E1149 Type 2 diabetes mellitus with other diabetic neurological complication: Secondary | ICD-10-CM

## 2023-08-25 DIAGNOSIS — C009 Malignant neoplasm of lip, unspecified: Secondary | ICD-10-CM | POA: Diagnosis not present

## 2023-08-28 ENCOUNTER — Other Ambulatory Visit: Payer: Self-pay | Admitting: Internal Medicine

## 2023-08-28 ENCOUNTER — Other Ambulatory Visit: Payer: Self-pay | Admitting: Family Medicine

## 2023-08-28 ENCOUNTER — Telehealth: Payer: Self-pay | Admitting: Family Medicine

## 2023-08-28 DIAGNOSIS — I10 Essential (primary) hypertension: Secondary | ICD-10-CM

## 2023-08-28 DIAGNOSIS — E1142 Type 2 diabetes mellitus with diabetic polyneuropathy: Secondary | ICD-10-CM

## 2023-08-28 NOTE — Telephone Encounter (Signed)
 Copied from CRM [id number]. Topic: Medicare AWV >> Aug 28, 2023  1:46 PM Juliana Ocean wrote: Reason for EAV:[id number] 08/25/2023 to sched AWV - NO VOICEMAIL  [name] [name]; Care Guide Ambulatory Clinical Support [place] [facility] Direct Dial: [phone]

## 2023-08-29 ENCOUNTER — Other Ambulatory Visit: Payer: Self-pay | Admitting: Internal Medicine

## 2023-08-29 DIAGNOSIS — E1142 Type 2 diabetes mellitus with diabetic polyneuropathy: Secondary | ICD-10-CM

## 2023-08-30 DIAGNOSIS — R700 Elevated erythrocyte sedimentation rate: Secondary | ICD-10-CM | POA: Diagnosis not present

## 2023-09-07 ENCOUNTER — Other Ambulatory Visit: Payer: Self-pay | Admitting: Family Medicine

## 2023-09-07 DIAGNOSIS — I10 Essential (primary) hypertension: Secondary | ICD-10-CM

## 2023-09-07 DIAGNOSIS — E1142 Type 2 diabetes mellitus with diabetic polyneuropathy: Secondary | ICD-10-CM

## 2023-09-07 DIAGNOSIS — R634 Abnormal weight loss: Secondary | ICD-10-CM

## 2023-09-07 NOTE — Telephone Encounter (Signed)
 Copied from CRM [id number]. Topic: Clinical - Medication Refill >> Sep 07, 2023 12:14 PM Peg Bouton F wrote: Medication:  repaglinide  (PRANDIN ) 0.5 MG tablet omeprazole  (PRILOSEC) 20 MG capsule metFORMIN  (GLUCOPHAGE -XR) 500 MG 24 hr tablet ramipril  (ALTACE ) 5 MG capsule mirtazapine  (REMERON ) 15 MG tablet   Has the patient contacted their pharmacy? Yes (Agent: If no, request that the patient contact the pharmacy for the refill. If patient does not wish to contact the pharmacy document the reason why and proceed with request.) (Agent: If yes, when and what did the pharmacy advise?)  This is the patient's preferred pharmacy:   [facility] - [place], [place] - [address] [place] [address] Phone: [phone] Fax: [phone]  Is this the correct pharmacy for this prescription? Yes If no, delete pharmacy and type the correct one.   Has the prescription been filled recently? No  Is the patient out of the medication? No  Has the patient been seen for an appointment in the last year OR does the patient have an upcoming appointment? Yes  Can we respond through MyChart? No, please call [phone]  Agent: Please be advised that Rx refills may take up to 3 business days. We ask that you follow-up with your pharmacy.

## 2023-09-13 DIAGNOSIS — G35 Multiple sclerosis: Secondary | ICD-10-CM | POA: Diagnosis not present

## 2023-09-22 ENCOUNTER — Encounter: Payer: Self-pay | Admitting: Pharmacist

## 2023-09-22 NOTE — Progress Notes (Signed)
 Pharmacy Quality Measure Review  This patient is appearing on a report for being at risk of failing the adherence measure for hypertension (ACEi/ARB) medications this calendar year.   Medication: ramipril   Last fill date: 06/30/2023 for 30 day supply  Reviewed Dr Anson Basta database - patient filled 30 day supply of ramipril  on 5/20 and 09/21/2023  Insurance report was not up to date. No action needed at this time.   Cecilie Coffee, PharmD Clinical Pharmacist [facility]  Population Health [phone]

## 2023-10-17 ENCOUNTER — Ambulatory Visit: Admitting: Family Medicine

## 2023-10-17 ENCOUNTER — Encounter: Payer: Self-pay | Admitting: Family Medicine

## 2023-10-17 VITALS — BP 120/64 | HR 64 | Temp 97.8°F | Resp 18 | Ht 65.0 in | Wt 149.0 lb

## 2023-10-17 DIAGNOSIS — E1151 Type 2 diabetes mellitus with diabetic peripheral angiopathy without gangrene: Secondary | ICD-10-CM

## 2023-10-17 DIAGNOSIS — D509 Iron deficiency anemia, unspecified: Secondary | ICD-10-CM

## 2023-10-17 DIAGNOSIS — I10 Essential (primary) hypertension: Secondary | ICD-10-CM | POA: Diagnosis not present

## 2023-10-17 DIAGNOSIS — D649 Anemia, unspecified: Secondary | ICD-10-CM

## 2023-10-17 DIAGNOSIS — E1169 Type 2 diabetes mellitus with other specified complication: Secondary | ICD-10-CM | POA: Diagnosis not present

## 2023-10-17 DIAGNOSIS — Z0000 Encounter for general adult medical examination without abnormal findings: Secondary | ICD-10-CM

## 2023-10-17 DIAGNOSIS — E785 Hyperlipidemia, unspecified: Secondary | ICD-10-CM | POA: Diagnosis not present

## 2023-10-17 DIAGNOSIS — C61 Malignant neoplasm of prostate: Secondary | ICD-10-CM

## 2023-10-17 MED ORDER — ACCU-CHEK AVIVA PLUS VI STRP: 1.0000 | ORAL_STRIP | Freq: Every day | 12 refills | Status: AC

## 2023-10-17 MED ORDER — ACCU-CHEK SOFTCLIX LANCETS MISC: 12 refills | Status: AC

## 2023-10-17 NOTE — Assessment & Plan Note (Signed)
 Encourage heart healthy diet such as MIND or DASH diet, increase exercise, avoid trans fats, simple carbohydrates and processed foods, consider a krill or fish or flaxseed oil cap daily.

## 2023-10-17 NOTE — Patient Instructions (Signed)
 Preventive Care 73 Years and Older, Male Preventive care refers to lifestyle choices and visits with your health care provider that can promote health and wellness. Preventive care visits are also called wellness exams. What can I expect for my preventive care visit? Counseling During your preventive care visit, your health care provider may ask about your: Medical history, including: Past medical problems. Family medical history. History of falls. Current health, including: Emotional well-being. Home life and relationship well-being. Sexual activity. Memory and ability to understand (cognition). Lifestyle, including: Alcohol, nicotine or tobacco, and drug use. Access to firearms. Diet, exercise, and sleep habits. Work and work Astronomer. Sunscreen use. Safety issues such as seatbelt and bike helmet use. Physical exam Your health care provider will check your: Height and weight. These may be used to calculate your BMI (body mass index). BMI is a measurement that tells if you are at a healthy weight. Waist circumference. This measures the distance around your waistline. This measurement also tells if you are at a healthy weight and may help predict your risk of certain diseases, such as type 2 diabetes and high blood pressure. Heart rate and blood pressure. Body temperature. Skin for abnormal spots. What immunizations do I need?  Vaccines are usually given at various ages, according to a schedule. Your health care provider will recommend vaccines for you based on your age, medical history, and lifestyle or other factors, such as travel or where you work. What tests do I need? Screening Your health care provider may recommend screening tests for certain conditions. This may include: Lipid and cholesterol levels. Diabetes screening. This is done by checking your blood sugar (glucose) after you have not eaten for a while (fasting). Hepatitis C test. Hepatitis B test. HIV (human  immunodeficiency virus) test. STI (sexually transmitted infection) testing, if you are at risk. Lung cancer screening. Colorectal cancer screening. Prostate cancer screening. Abdominal aortic aneurysm (AAA) screening. You may need this if you are a current or former smoker. Talk with your health care provider about your test results, treatment options, and if necessary, the need for more tests. Follow these instructions at home: Eating and drinking  Eat a diet that includes fresh fruits and vegetables, whole grains, lean protein, and low-fat dairy products. Limit your intake of foods with high amounts of sugar, saturated fats, and salt. Take vitamin and mineral supplements as recommended by your health care provider. Do not drink alcohol if your health care provider tells you not to drink. If you drink alcohol: Limit how much you have to 0-2 drinks a day. Know how much alcohol is in your drink. In the [place] one drink equals one 12 oz bottle of beer (355 mL), one 5 oz glass of wine (148 mL), or one 1 oz glass of hard liquor (44 mL). Lifestyle Brush your teeth every morning and night with fluoride toothpaste. Floss one time each day. Exercise for at least 30 minutes 5 or more days each week. Do not use any products that contain nicotine or tobacco. These products include cigarettes, chewing tobacco, and vaping devices, such as e-cigarettes. If you need help quitting, ask your health care provider. Do not use drugs. If you are sexually active, practice safe sex. Use a condom or other form of protection to prevent STIs. Take aspirin only as told by your health care provider. Make sure that you understand how much to take and what form to take. Work with your health care provider to find out whether it is safe  and beneficial for you to take aspirin daily. Ask your health care provider if you need to take a cholesterol-lowering medicine (statin). Find healthy ways to manage stress, such  as: Meditation, yoga, or listening to music. Journaling. Talking to a trusted person. Spending time with friends and family. Safety Always wear your seat belt while driving or riding in a vehicle. Do not drive: If you have been drinking alcohol. Do not ride with someone who has been drinking. When you are tired or distracted. While texting. If you have been using any mind-altering substances or drugs. Wear a helmet and other protective equipment during sports activities. If you have firearms in your house, make sure you follow all gun safety procedures. Minimize exposure to UV radiation to reduce your risk of skin cancer. What's next? Visit your health care provider once a year for an annual wellness visit. Ask your health care provider how often you should have your eyes and teeth checked. Stay up to date on all vaccines. This information is not intended to replace advice given to you by your health care provider. Make sure you discuss any questions you have with your health care provider. Document Revised: 09/23/2020 Document Reviewed: 09/23/2020 Elsevier Patient Education  2024 [employer].

## 2023-10-17 NOTE — Assessment & Plan Note (Signed)
 Per u rology

## 2023-10-17 NOTE — Assessment & Plan Note (Signed)
 Ghm utd Check labs  See AVS  Health Maintenance  Topic Date Due   DTaP/Tdap/Td (2 - Tdap) 10/15/2017   COVID-19 Vaccine (5 - 2024-25 season) 12/11/2022   HEMOGLOBIN A1C  09/21/2023   Medicare Annual Wellness (AWV)  10/27/2023   INFLUENZA VACCINE  11/10/2023   OPHTHALMOLOGY EXAM  03/21/2024   FOOT EXAM  10/16/2024   Pneumococcal Vaccine: 50+ Years  Completed   Zoster Vaccines- Shingrix  Completed   Hepatitis B Vaccines  Aged Out   HPV VACCINES  Aged Out   Meningococcal B Vaccine  Aged Out

## 2023-10-17 NOTE — Assessment & Plan Note (Signed)
 Well controlled, no changes to meds. Encouraged heart healthy diet such as the DASH diet and exercise as tolerated.

## 2023-10-17 NOTE — Progress Notes (Signed)
 Established Patient Office Visit  Subjective   Patient ID: [name] [name], male    DOB: 03/24/1935  Age: 89 y.o. MRN: [id number]  Chief Complaint  Patient presents with   Annual Exam    Pt states fasting     HPI Discussed the use of AI scribe software for clinical note transcription with the patient, who gave verbal consent to proceed.  History of Present Illness [name] [name] is an 89 year old male who presents for a follow-up visit and management of diabetes supplies.  He has been traveling frequently to [place]  over the past two months due to the recent passing of his daughter, which has led to some weight loss of approximately four to five pounds. He attributes this weight loss to the stress and activity related to these events.  He left his diabetes management supplies, including his Accu-Check machine, strips, and lancets, in [place]  and requires replacements. Despite this, he has been monitoring his blood sugars at home and states that they have been well-controlled.  He continues to wear a brace on his knee, which he finds helpful. He has had a previous operation on one knee and is awaiting a new brace for the other knee.  He has a family history of lung cancer. He has not visited a dentist recently due to frequent travel but plans to schedule an appointment soon.  No stomach or joint issues. He mentions a scaly spot on his head from the car headrest. His feet are okay, and he regularly sees an eye doctor, with an upcoming appointment on the 23rd.      Review of Systems  Constitutional:  Negative for fever and malaise/fatigue.  HENT:  Negative for congestion.   Eyes:  Negative for blurred vision.  Respiratory:  Negative for shortness of breath.   Cardiovascular:  Negative for chest pain, palpitations and leg swelling.  Gastrointestinal:  Negative for abdominal pain, blood in stool and nausea.  Genitourinary:  Negative for dysuria and frequency.  Musculoskeletal:   Negative for falls.  Skin:  Negative for rash.  Neurological:  Negative for dizziness, loss of consciousness and headaches.  Endo/Heme/Allergies:  Negative for environmental allergies.  Psychiatric/Behavioral:  Negative for depression. The patient is not nervous/anxious.       Objective:     BP 120/64 (BP Location: Left Arm, Patient Position: Sitting, Cuff Size: Normal)   Pulse 64   Temp 97.8 F (36.6 C) (Oral)   Resp 18   Ht 5' 5 (1.651 m)   Wt 149 lb (67.6 kg)   SpO2 97%   BMI 24.79 kg/m     Physical Exam Vitals and nursing note reviewed.  Constitutional:      General: He is not in acute distress.    Appearance: Normal appearance. He is well-developed.  HENT:     Head: Normocephalic and atraumatic.     Right Ear: Tympanic membrane, ear canal and external ear normal. There is no impacted cerumen.     Left Ear: Tympanic membrane, ear canal and external ear normal. There is no impacted cerumen.     Nose: Nose normal.     Mouth/Throat:     Mouth: Mucous membranes are moist.     Pharynx: Oropharynx is clear. No oropharyngeal exudate or posterior oropharyngeal erythema.  Eyes:     General: No scleral icterus.       Right eye: No discharge.        Left eye: No discharge.     Conjunctiva/sclera: Conjunctivae  normal.     Pupils: Pupils are equal, round, and reactive to light.  Neck:     Thyroid : No thyromegaly.     Vascular: No JVD.  Cardiovascular:     Rate and Rhythm: Normal rate and regular rhythm.     Heart sounds: Normal heart sounds. No murmur heard. Pulmonary:     Effort: Pulmonary effort is normal. No respiratory distress.     Breath sounds: Normal breath sounds.  Abdominal:     General: Bowel sounds are normal. There is no distension.     Palpations: Abdomen is soft. There is no mass.     Tenderness: There is no abdominal tenderness. There is no guarding or rebound.  Musculoskeletal:        General: Normal range of motion.     Cervical back: Normal range of  motion and neck supple.     Right lower leg: No edema.     Left lower leg: No edema.  Lymphadenopathy:     Cervical: No cervical adenopathy.  Skin:    General: Skin is warm and dry.     Findings: No erythema or rash.  Neurological:     Mental Status: He is alert and oriented to person, place, and time.     Cranial Nerves: No cranial nerve deficit.     Motor: No abnormal muscle tone.     Deep Tendon Reflexes: Reflexes are normal and symmetric. Reflexes normal.  Psychiatric:        Mood and Affect: Mood normal.        Behavior: Behavior normal.        Thought Content: Thought content normal.        Judgment: Judgment normal.      No results found for any visits on 10/17/23.     The ASCVD Risk score (Arnett DK, et al., 2019) failed to calculate for the following reasons:   The 2019 ASCVD risk score is only valid for ages 66 to 21    Assessment & Plan:   Problem List Items Addressed This Visit       Unprioritized   DM (diabetes mellitus) type II, controlled, with peripheral vascular disorder (HCC)   Relevant Medications   Accu-Chek Softclix Lancets lancets   glucose blood (ACCU-CHEK AVIVA PLUS) test strip   Other Relevant Orders   TSH   Hemoglobin A1c   Microalbumin / creatinine urine ratio   Iron deficiency anemia   Relevant Orders   CBC with Differential/Platelet   Iron, TIBC and Ferritin Panel   Anemia   Relevant Orders   CBC with Differential/Platelet   Iron, TIBC and Ferritin Panel   CBC with Differential/Platelet   Prostate cancer [facility])   Per urology      Relevant Orders   PSA   Preventative health care - Primary   Ghm utd Check labs  See AVS  Health Maintenance  Topic Date Due   DTaP/Tdap/Td (2 - Tdap) 10/15/2017   COVID-19 Vaccine (5 - 2024-25 season) 12/11/2022   HEMOGLOBIN A1C  09/21/2023   Medicare Annual Wellness (AWV)  10/27/2023   INFLUENZA VACCINE  11/10/2023   OPHTHALMOLOGY EXAM  03/21/2024   FOOT EXAM  10/16/2024   Pneumococcal  Vaccine: 50+ Years  Completed   Zoster Vaccines- Shingrix  Completed   Hepatitis B Vaccines  Aged Out   HPV VACCINES  Aged Out   Meningococcal B Vaccine  Aged Out         Hyperlipidemia associated with type  2 diabetes mellitus (HCC)   Encourage heart healthy diet such as MIND or DASH diet, increase exercise, avoid trans fats, simple carbohydrates and processed foods, consider a krill or fish or flaxseed oil cap daily.        Relevant Orders   Lipid panel   TSH   Comprehensive metabolic panel with GFR   CBC with Differential/Platelet   Essential hypertension   Well controlled, no changes to meds. Encouraged heart healthy diet such as the DASH diet and exercise as tolerated.        Relevant Orders   Lipid panel   TSH   Comprehensive metabolic panel with GFR   CBC with Differential/Platelet  Assessment and Plan Assessment & Plan Knee Pain   He wears a brace on one knee due to a previous operation, limiting treatment options. A new brace is being considered for the other knee. Continue using the current knee brace for support and obtain a new brace for the other knee.  Diabetes Mellitus   He requires lancets and strips for his Accu-Check glucometer, which were left in [place] . Blood sugars are well-controlled. Prescribe Accu-Check lancets and strips to be picked up at [facility] on [address]. Advise him to ensure the correct items are received before leaving the pharmacy.  General Health Maintenance   He is due for a tetanus shot. Receive a tetanus shot at the pharmacy when picking up medications.  Follow-up   He has an upcoming eye doctor appointment on the 23rd and needs to schedule a dental appointment. Remind him of the eye doctor appointment on the 23rd and encourage scheduling a dental appointment.    Return in about 6 months (around 04/18/2024), or if symptoms worsen or fail to improve, for htn, hyperlipidemia, DM.    Takyla Kuchera R Lowne Chase, DO

## 2023-10-18 ENCOUNTER — Telehealth: Payer: Self-pay | Admitting: Family Medicine

## 2023-10-18 ENCOUNTER — Encounter

## 2023-10-18 ENCOUNTER — Telehealth: Payer: Self-pay | Admitting: Pharmacist

## 2023-10-18 DIAGNOSIS — E1151 Type 2 diabetes mellitus with diabetic peripheral angiopathy without gangrene: Secondary | ICD-10-CM

## 2023-10-18 LAB — MICROALBUMIN / CREATININE URINE RATIO
Creatinine,U: 175.5 mg/dL
Microalb Creat Ratio: 5 mg/g (ref 0.0–30.0)
Microalb, Ur: 0.9 mg/dL (ref 0.0–1.9)

## 2023-10-18 LAB — CBC WITH DIFFERENTIAL/PLATELET
Basophils Absolute: 0 K/uL (ref 0.0–0.1)
Basophils Relative: 0.7 % (ref 0.0–3.0)
Eosinophils Absolute: 0.1 K/uL (ref 0.0–0.7)
Eosinophils Relative: 3.5 % (ref 0.0–5.0)
HCT: 30.4 % — ABNORMAL LOW (ref 39.0–52.0)
Hemoglobin: 10.3 g/dL — ABNORMAL LOW (ref 13.0–17.0)
Lymphocytes Relative: 33.1 % (ref 12.0–46.0)
Lymphs Abs: 1.4 K/uL (ref 0.7–4.0)
MCHC: 34 g/dL (ref 30.0–36.0)
MCV: 96.2 fl (ref 78.0–100.0)
Monocytes Absolute: 0.4 K/uL (ref 0.1–1.0)
Monocytes Relative: 9.6 % (ref 3.0–12.0)
Neutro Abs: 2.2 K/uL (ref 1.4–7.7)
Neutrophils Relative %: 53.1 % (ref 43.0–77.0)
Platelets: 334 K/uL (ref 150.0–400.0)
RBC: 3.16 Mil/uL — ABNORMAL LOW (ref 4.22–5.81)
RDW: 13.3 % (ref 11.5–15.5)
WBC: 4.1 K/uL (ref 4.0–10.5)

## 2023-10-18 LAB — COMPREHENSIVE METABOLIC PANEL WITH GFR
ALT: 7 U/L (ref 0–53)
AST: 12 U/L (ref 0–37)
Albumin: 4 g/dL (ref 3.5–5.2)
Alkaline Phosphatase: 79 U/L (ref 39–117)
BUN: 17 mg/dL (ref 6–23)
CO2: 32 meq/L (ref 19–32)
Calcium: 9.5 mg/dL (ref 8.4–10.5)
Chloride: 104 meq/L (ref 96–112)
Creatinine, Ser: 1.56 mg/dL — ABNORMAL HIGH (ref 0.40–1.50)
GFR: 39.8 mL/min — ABNORMAL LOW (ref >60.00)
Glucose, Bld: 115 mg/dL — ABNORMAL HIGH (ref 70–99)
Potassium: 4.9 meq/L (ref 3.5–5.1)
Sodium: 141 meq/L (ref 135–145)
Total Bilirubin: 0.2 mg/dL (ref 0.2–1.2)
Total Protein: 6.4 g/dL (ref 6.0–8.3)

## 2023-10-18 LAB — IRON,TIBC AND FERRITIN PANEL
%SAT: 17 % — ABNORMAL LOW (ref 20–48)
Ferritin: 77 ng/mL (ref 24–380)
Iron: 47 ug/dL — ABNORMAL LOW (ref 50–180)
TIBC: 281 ug/dL (ref 250–425)

## 2023-10-18 LAB — LIPID PANEL
Cholesterol: 197 mg/dL (ref 0–200)
HDL: 42.7 mg/dL (ref >39.00)
LDL Cholesterol: 123 mg/dL — ABNORMAL HIGH (ref 0–99)
NonHDL: 153.9
Total CHOL/HDL Ratio: 5
Triglycerides: 153 mg/dL — ABNORMAL HIGH (ref 0.0–149.0)
VLDL: 30.6 mg/dL (ref 0.0–40.0)

## 2023-10-18 LAB — TSH: TSH: 1.43 u[IU]/mL (ref 0.35–5.50)

## 2023-10-18 LAB — PSA: PSA: 9.98 ng/mL — ABNORMAL HIGH (ref 0.10–4.00)

## 2023-10-18 LAB — HEMOGLOBIN A1C: Hgb A1c MFr Bld: 7.5 % — ABNORMAL HIGH (ref 4.6–6.5)

## 2023-10-18 MED ORDER — BLOOD GLUCOSE METER KIT: PACK | 0 refills | Status: AC

## 2023-10-18 NOTE — Telephone Encounter (Signed)
 Attempt was made to contact patient by phone today for follow up by Clinical Pharmacist regarding medication adherence. Initially patient answered but after he said hello he did not answer back. Tried to call him back and also tried his cell phone but no answer.   Patient was in to see PCP yesterday and labs checked.   Lab Results  Component Value Date   HGBA1C 7.5 (H) 10/17/2023   Lab Results  Component Value Date   CHOL 197 10/17/2023   HDL 42.70 10/17/2023   LDLCALC 123 (H) 10/17/2023   TRIG 153.0 (H) 10/17/2023   CHOLHDL 5 10/17/2023   Scr was 1.56 and eGFR was 39.8   LDL is not at goal. Refill records show that he has not filled atorvastatin  20mg since 10/05/2021. It appears he uses adherence packages from [facility] - Will need to send updated Rx to [facility] if PCP wants him to restart atorvastatin  20mg   Also has not had finasteride  5mg , metformin  Er 500mg  twice a day or regalinide 0.5mg  filled since 03/02/23 -   Will consult with PCP about restarting something for diabetes. Would like to use SGLT2 sinc ehe hash CKD but he has adverse reaction with Farxiga  in past - cause pain and swelling of his penis.  Since eGFR is > 30 can restart metformin  - recommend 500mg  ER once daily to start. Would hold off on repaglinide  to see if still needed since can cause hypoglycemia.  Will leave it up to PCP to determine if he need to restart finsateride - he is taking tamsulosin  daily   Any prescriptions started should be sent to [facility] to be included in his adherence packs.

## 2023-10-18 NOTE — Telephone Encounter (Signed)
 Copied from CRM [id number]. Topic: Clinical - Medical Advice >> Oct 18, 2023  1:39 PM Gennette ORN wrote: Reason for CRM: Patient needs the machine so they can take their medications. They already reached out to pharmacy for the medication and have it just need the machine.

## 2023-10-18 NOTE — Telephone Encounter (Signed)
 Rx for meter faxed to pharmacy

## 2023-10-19 ENCOUNTER — Ambulatory Visit: Payer: Self-pay | Admitting: Family Medicine

## 2023-10-19 NOTE — Progress Notes (Signed)
 This encounter was created in error - please disregard.

## 2023-10-20 ENCOUNTER — Other Ambulatory Visit: Payer: Self-pay

## 2023-10-20 DIAGNOSIS — R972 Elevated prostate specific antigen [PSA]: Secondary | ICD-10-CM

## 2023-10-20 NOTE — Telephone Encounter (Signed)
 Pt called. Phone continued to ring

## 2023-11-01 DIAGNOSIS — H401131 Primary open-angle glaucoma, bilateral, mild stage: Secondary | ICD-10-CM | POA: Diagnosis not present

## 2023-11-01 DIAGNOSIS — H2513 Age-related nuclear cataract, bilateral: Secondary | ICD-10-CM | POA: Diagnosis not present

## 2023-11-01 DIAGNOSIS — E119 Type 2 diabetes mellitus without complications: Secondary | ICD-10-CM | POA: Diagnosis not present

## 2023-11-02 ENCOUNTER — Ambulatory Visit: Admitting: *Deleted

## 2023-11-02 VITALS — Ht 65.0 in | Wt 151.8 lb

## 2023-11-02 DIAGNOSIS — Z0000 Encounter for general adult medical examination without abnormal findings: Secondary | ICD-10-CM

## 2023-11-02 NOTE — Progress Notes (Signed)
 Subjective:   [name] [name] is a 89 y.o. who presents for a Medicare Wellness preventive visit.  As a reminder, Annual Wellness Visits don't include a physical exam, and some assessments may be limited, especially if this visit is performed virtually. We may recommend an in-person follow-up visit with your provider if needed.  Visit Complete: Virtual I connected with  [name] [name] on 11/02/23 by a audio enabled telemedicine application and verified that I am speaking with the correct person using two identifiers.  Patient Location: Home  Provider Location: Office/Clinic  I discussed the limitations of evaluation and management by telemedicine. The patient expressed understanding and agreed to proceed.  Vital Signs: Because this visit was a virtual/telehealth visit, some criteria may be missing or patient reported. Any vitals not documented were not able to be obtained and vitals that have been documented are patient reported.  VideoDeclined- This patient declined Librarian, academic. Therefore the visit was completed with audio only.  Persons Participating in Visit: Patient.  AWV Questionnaire: No: Patient Medicare AWV questionnaire was not completed prior to this visit.        Objective:    Today's Vitals   11/02/23 1350  Weight: 151 lb 12.8 oz (68.9 kg)  Height: 5' 5 (1.651 m)   Body mass index is 25.26 kg/m.     10/27/2022    3:29 PM 10/22/2021    9:40 AM 10/19/2020   10:35 AM 09/02/2019   11:39 AM 06/27/2019   11:03 AM 06/18/2018    8:31 AM 06/13/2016    8:53 AM  Advanced Directives  Does Patient Have a Medical Advance Directive? Yes Yes Yes Yes Yes Yes  Yes   Type of Estate agent of [place];Living will Healthcare Power of [place];Living will Healthcare Power of [place];Living will Living will  Living will;Healthcare Power of [employer] Power of [place];Living will  Does patient want to make changes to medical  advance directive? No - Patient declined No - Patient declined  No - Patient declined   No - Patient declined   Copy of Healthcare Power of Attorney in Chart? No - copy requested No - copy requested No - copy requested    No - copy requested      Data saved with a previous flowsheet row definition    Current Medications (verified) Outpatient Encounter Medications as of 11/02/2023  Medication Sig   Accu-Chek Softclix Lancets lancets Use as instructed   acetaminophen  (TYLENOL ) 500 MG tablet Take 500 mg by mouth every 6 (six) hours as needed.   Ascorbic Acid (VITAMIN C) 1000 MG tablet Take 1,000 mg by mouth daily.   aspirin  81 MG chewable tablet Chew by mouth daily.   atorvastatin  (LIPITOR) 20 MG tablet TAKE 1 TABLET EVERY DAY   b complex vitamins tablet Take 1 tablet by mouth daily.   blood glucose meter kit and supplies Dispense based on patient and insurance preference. Use up to four times daily as directed. (FOR ICD-10 E10.9, E11.9).   cholecalciferol (VITAMIN D3) 25 MCG (1000 UNIT) tablet Take 1,000 Units by mouth daily.   Coenzyme Q10 (CO Q 10 PO) Take 50 mg by mouth daily.   docusate sodium  (COLACE) 100 MG capsule Take 1 capsule (100 mg total) by mouth every 12 (twelve) hours.   finasteride  (PROSCAR ) 5 MG tablet Take 5 mg by mouth daily.   furosemide  (LASIX ) 20 MG tablet TAKE ONE TABLET BY MOUTH EVERY OTHER DAY (NEED APPOINTMENT AND LABS)  gabapentin  (NEURONTIN ) 600 MG tablet TAKE ONE TABLET (600 MG TOTAL) BY MOUTH THREE TIMES DAILY AT 9AM, 3PM AND 9PM   glucose blood (ACCU-CHEK AVIVA PLUS) test strip 1 each by Other route daily. And lancets 1/day   hydrocortisone  (ANUSOL -HC) 2.5 % rectal cream Place 1 Application rectally 2 (two) times daily.   latanoprost  (XALATAN ) 0.005 % ophthalmic solution Place 1 drop into both eyes at bedtime.   metFORMIN  (GLUCOPHAGE -XR) 500 MG 24 hr tablet Take 1 tablet (500 mg total) by mouth in the morning and at bedtime.   mirtazapine  (REMERON ) 15 MG tablet  TAKE ONE TABLET BY MOUTH DAILY AT 9 PM AT BEDTIME   omeprazole  (PRILOSEC) 20 MG capsule TAKE ONE CAPSULE BY MOUTH DAILY AT 9 AM   ramipril  (ALTACE ) 5 MG capsule TAKE ONE CAPSULE BY MOUTH DAILY AT 9 AM   repaglinide  (PRANDIN ) 0.5 MG tablet Take 1 tablet (0.5 mg total) by mouth daily before breakfast.   tamsulosin  (FLOMAX ) 0.4 MG CAPS capsule TAKE ONE CAPSULE BY MOUTH DAILY AT 7 PM   traMADol  (ULTRAM ) 50 MG tablet Take 1 tablet by mouth daily.   TRUE METRIX BLOOD GLUCOSE TEST test strip USE AS INSTRUCTED   No facility-administered encounter medications on file as of 11/02/2023.    Allergies (verified) Farxiga  [dapagliflozin ]   History: Past Medical History:  Diagnosis Date   Diabetes mellitus 1955   type 2 - fasting 100-120   Diverticulosis 2009   GERD (gastroesophageal reflux disease)    Glaucoma    bilateral   History of shingles    Hyperlipidemia    5 yrs   Hypertension    Neuromuscular disorder (HCC)    due to shingles around abdomen area    Osteoarthritis    knees (01/31/2014)   Prostate cancer (HCC)    no treatments; it's going away by itself w/the pills I take   Past Surgical History:  Procedure Laterality Date   ANTERIOR CERVICAL DECOMP/DISCECTOMY FUSION N/A 09/03/2015   Procedure: ANTERIOR CERVICAL DECOMPRESSION FUSION, CERVICAL 5-6 WITH INSTRUMENTATION AND ALLOGRAFT;  Surgeon: Oneil Priestly, MD;  Location: [facility] OR;  Service: Orthopedics;  Laterality: N/A;  ANTERIOR CERVICAL DECOMPRESSION FUSION, CERVICAL 5-6 WITH INSTRUMENTATION AND ALLOGRAFT   COLONOSCOPY     FINE NEEDLE ASPIRATION Left 06/10/15   Knee   FINE NEEDLE ASPIRATION  05/26/15   Back   JOINT REPLACEMENT     TONSILLECTOMY     TOTAL KNEE ARTHROPLASTY Right 01/31/2014   TOTAL KNEE ARTHROPLASTY Right 01/31/2014   Procedure: RIGHT TOTAL KNEE ARTHROPLASTY;  Surgeon: Dempsey JINNY Sensor, MD;  Location: [facility] OR;  Service: Orthopedics;  Laterality: Right;   Family History  Problem Relation Age of Onset   Diabetes  Mother    Heart disease Brother        questionable   Colon cancer Daughter 62   Arthritis Other    Diabetes Other    Cancer Other        prostate   Colon polyps Neg Hx    Esophageal cancer Neg Hx    Rectal cancer Neg Hx    Stomach cancer Neg Hx    Social History   Socioeconomic History   Marital status: Widowed    Spouse name: Not on file   Number of children: Not on file   Years of education: Not on file   Highest education level: 12th grade  Occupational History    Comment: retired Investment banker, operational     Comment: Psychiatrist  Tobacco Use  Smoking status: Former    Types: Cigars    Quit date: 04/12/1995    Years since quitting: 28.5   Smokeless tobacco: Never   Tobacco comments:    chews cigars still   Vaping Use   Vaping status: Never Used  Substance and Sexual Activity   Alcohol use: Yes    Comment: 1 glass wine monthly   Drug use: No   Sexual activity: Never  Other Topics Concern   Not on file  Social History Narrative   Works out in the garden   Social Drivers of Corporate investment banker Strain: Low Risk  (10/27/2022)   Overall Financial Resource Strain (CARDIA)    Difficulty of Paying Living Expenses: Not hard at all  Food Insecurity: No Food Insecurity (10/27/2022)   Hunger Vital Sign    Worried About Running Out of Food in the Last Year: Never true    Ran Out of Food in the Last Year: Never true  Transportation Needs: No Transportation Needs (10/27/2022)   PRAPARE - Administrator, Civil Service (Medical): No    Lack of Transportation (Non-Medical): No  Physical Activity: Inactive (10/27/2022)   Exercise Vital Sign    Days of Exercise per Week: 0 days    Minutes of Exercise per Session: 0 min  Stress: No Stress Concern Present (10/27/2022)   [employer] of Occupational Health - Occupational Stress Questionnaire    Feeling of Stress : Not at all  Social Connections: Moderately Integrated (10/27/2022)   Social Connection and Isolation Panel     Frequency of Communication with Friends and Family: More than three times a week    Frequency of Social Gatherings with Friends and Family: Once a week    Attends Religious Services: More than 4 times per year    Active Member of [employer] or Organizations: Yes    Attends Banker Meetings: More than 4 times per year    Marital Status: Widowed    Tobacco Counseling Counseling given: Not Answered Tobacco comments: chews cigars still     Clinical Intake:              Lab Results  Component Value Date   HGBA1C 7.5 (H) 10/17/2023   HGBA1C 7.0 (H) 03/23/2023   HGBA1C 7.1 (A) 04/25/2022               Activities of Daily Living     10/17/2023    2:36 PM  In your present state of health, do you have any difficulty performing the following activities:  Hearing? 1  Vision? 0  Difficulty concentrating or making decisions? 0  Walking or climbing stairs? 1  Dressing or bathing? 0  Doing errands, shopping? 0    Patient Care Team: Antonio Meth, Jamee SAUNDERS, DO as PCP - General (Family Medicine) Nieves Cough, MD as Consulting Physician (Urology) Cyrus Carwin, MD as Consulting Physician (Ophthalmology) Liam Lerner, MD as Consulting Physician (Orthopedic Surgery) Orlando Camellia MARLA DEVONNA as Physician Assistant (Orthopedic Surgery) Cesario Boer, MD as Attending Physician (Physical Medicine and Rehabilitation) Beuford Anes, MD as Consulting Physician (Orthopedic Surgery) [facility], Donell Cardinal, MD as Attending Physician (Endocrinology)  I have updated your Care Teams any recent Medical Services you may have received from other providers in the past year.     Assessment:   This is a routine wellness examination for [employer].  Hearing/Vision screen Hearing Screening - Comments:: Has bilateral hearing aids. Currently broken, awaiting repair Vision Screening - Comments::  Wears otc readers -- up to date with routine eye exams with Gaither Quan.    Goals  Addressed   None    Depression Screen     10/17/2023    2:36 PM 10/27/2022    3:13 PM 07/28/2022   10:00 AM 10/22/2021    9:41 AM 10/19/2020   10:40 AM 09/02/2019   11:43 AM 06/27/2019   11:03 AM  PHQ 2/9 Scores  PHQ - 2 Score 0 0 0 0 0 0 0    Fall Risk     10/17/2023    2:36 PM 10/27/2022    3:08 PM 07/28/2022   10:00 AM 10/22/2021    9:40 AM 10/19/2020   10:37 AM  Fall Risk   Falls in the past year? 0 0 0 0 0  Number falls in past yr: 0 0 0 0 0  Injury with Fall? 0 0 0 0 0  Risk for fall due to :  No Fall Risks Impaired balance/gait History of fall(s)   Follow up Falls evaluation completed Falls evaluation completed Falls evaluation completed Falls evaluation completed  Falls prevention discussed      Data saved with a previous flowsheet row definition    MEDICARE RISK AT HOME:     TIMED UP AND GO:  Was the test performed?  No,audio  Cognitive Function: 6CIT completed    06/13/2016    8:55 AM  MMSE - Mini Mental State Exam  Orientation to time 5   Orientation to Place 5   Registration 3   Attention/ Calculation 5   Recall 3   Language- name 2 objects 2   Language- repeat 1  Language- follow 3 step command 3   Language- read & follow direction 1   Write a sentence 1   Copy design 1   Total score 30      Data saved with a previous flowsheet row definition        10/27/2022    3:13 PM 10/22/2021    9:53 AM  6CIT Screen  What Year? 4 points 0 points  What month? 3 points 0 points  What time? 0 points 0 points  Count back from 20 0 points 0 points  Months in reverse 2 points 4 points  Repeat phrase 10 points 6 points  Total Score 19 points 10 points    Immunizations Immunization History  Administered Date(s) Administered   Fluad Quad(high Dose 65+) 12/04/2018, 12/26/2019, 04/29/2021, 01/07/2022   Fluad Trivalent(High Dose 65+) 03/23/2023   Influenza Split 12/21/2011   Influenza Whole 12/19/2007, 01/16/2009, 01/27/2010   Influenza, High Dose Seasonal PF  12/15/2014, 12/24/2016, 02/21/2018   Influenza-Unspecified 01/01/2014, 12/24/2016   Moderna Sars-Covid-2 Vaccination 06/01/2019, 06/29/2019, 02/04/2020, 07/21/2020   Pneumococcal Conjugate-13 05/01/2014, 09/16/2014   Pneumococcal Polysaccharide-23 12/19/2007, 04/25/2019   Td 10/16/2007   Zoster Recombinant(Shingrix) 07/06/2016, 10/05/2016    Screening Tests Health Maintenance  Topic Date Due   DTaP/Tdap/Td (2 - Tdap) 10/15/2017   COVID-19 Vaccine (5 - 2024-25 season) 12/11/2022   Medicare Annual Wellness (AWV)  10/27/2023   INFLUENZA VACCINE  11/10/2023   OPHTHALMOLOGY EXAM  03/21/2024   HEMOGLOBIN A1C  04/18/2024   FOOT EXAM  10/16/2024   Pneumococcal Vaccine: 50+ Years  Completed   Zoster Vaccines- Shingrix  Completed   Hepatitis B Vaccines  Aged Out   HPV VACCINES  Aged Out   Meningococcal B Vaccine  Aged Out    Health Maintenance  Health Maintenance Due  Topic Date Due  DTaP/Tdap/Td (2 - Tdap) 10/15/2017   COVID-19 Vaccine (5 - 2024-25 season) 12/11/2022   Medicare Annual Wellness (AWV)  10/27/2023   Health Maintenance Items Addressed: Will get tetanus booster at pharmacy.  Additional Screening:  Vision Screening: Recommended annual ophthalmology exams for early detection of glaucoma and other disorders of the eye. Would you like a referral to an eye doctor? No    Dental Screening: Recommended annual dental exams for proper oral hygiene  Community Resource Referral / Chronic Care Management: CRR required this visit?  No   CCM required this visit?  No   Plan:    I have personally reviewed and noted the following in the patient's chart:   Medical and social history Use of alcohol, tobacco or illicit drugs  Current medications and supplements including opioid prescriptions. Patient is not currently taking opioid prescriptions. Functional ability and status Nutritional status Physical activity Advanced directives List of other  physicians Hospitalizations, surgeries, and ER visits in previous 12 months Vitals Screenings to include cognitive, depression, and falls Referrals and appointments  In addition, I have reviewed and discussed with patient certain preventive protocols, quality metrics, and best practice recommendations. A written personalized care plan for preventive services as well as general preventive health recommendations were provided to patient.   Lolita Libra, CMA   11/02/2023   After Visit Summary: (Mail) Due to this being a telephonic visit, the after visit summary with patients personalized plan was offered to patient via mail   Notes: Nothing significant to report at this time.

## 2023-11-02 NOTE — Patient Instructions (Addendum)
 [name] , Thank you for taking time out of your busy schedule to complete your Annual Wellness Visit with me. I enjoyed our conversation and look forward to speaking with you again next year. I, as well as your care team,  appreciate your ongoing commitment to your health goals. Please review the following plan we discussed and let me know if I can assist you in the future. Your Game plan/ To Do List    Follow up Visits: Next Medicare AWV with our clinical staff: 11/05/24   9:40am  Next Office Visit with your provider: 04/19/24 9:40am  Clinician Recommendations:  Aim for 30 minutes of exercise or brisk walking, 6-8 glasses of water , and 5 servings of fruits and vegetables each day.   You will need to get the following vaccines at your local pharmacy: tetanus  This is a list of the screening recommended for you and due dates:  Health Maintenance  Topic Date Due   DTaP/Tdap/Td vaccine (2 - Tdap) 10/15/2017   COVID-19 Vaccine (5 - 2024-25 season) 12/11/2022   Medicare Annual Wellness Visit  10/27/2023   Flu Shot  11/10/2023   Eye exam for diabetics  03/21/2024   Hemoglobin A1C  04/18/2024   Complete foot exam   10/16/2024   Pneumococcal Vaccine for age over 69  Completed   Zoster (Shingles) Vaccine  Completed   Hepatitis B Vaccine  Aged Out   HPV Vaccine  Aged Out   Meningitis B Vaccine  Aged Out   Once notarized, you may return a copy of the advanced directive by either of the following:  Advanced directives: (Copy Requested) Please bring a copy of your health care power of attorney and living will to the office to be added to your chart at your convenience. You can mail to [facility] [address] [place], [place] [address] or email to [email or web address] Advance Care Planning is important because it:  [x]  Makes sure you receive the medical care that is consistent with your values, goals, and preferences  [x]  It provides guidance to your family and loved  ones and reduces their decisional burden about whether or not they are making the right decisions based on your wishes.  Follow the link provided in your after visit summary or read over the paperwork we have mailed to you to help you started getting your Advance Directives in place. If you need assistance in completing these, please reach out to us  so that we can help you!  See attachments for Preventive Care and Fall Prevention Tips.

## 2023-11-13 ENCOUNTER — Encounter: Payer: Self-pay | Admitting: Pharmacist

## 2023-11-13 NOTE — Progress Notes (Signed)
 11/13/2023 - Addendum  I tried to call his first emergency contact Carlin Fearing, his nephew but number in records was wrong number.  I also tried to call his second emergency contact Rosina Penton - LM on VM with CB# [phone] or [phone]

## 2023-11-13 NOTE — Progress Notes (Signed)
 Subjective:   [name] [name] is a 89 y.o. who presents for a Medicare Wellness preventive visit.  As a reminder, Annual Wellness Visits don't include a physical exam, and some assessments may be limited, especially if this visit is performed virtually. We may recommend an in-person follow-up visit with your provider if needed.  Visit Complete: Virtual I connected with  [name] [name] on 11/02/23 by a audio enabled telemedicine application and verified that I am speaking with the correct person using two identifiers.  Patient Location: Home  Provider Location: Office/Clinic  I discussed the limitations of evaluation and management by telemedicine. The patient expressed understanding and agreed to proceed.  Vital Signs: Because this visit was a virtual/telehealth visit, some criteria may be missing or patient reported. Any vitals not documented were not able to be obtained and vitals that have been documented are patient reported.  VideoDeclined- This patient declined Librarian, academic. Therefore the visit was completed with audio only.  Persons Participating in Visit: Patient.  AWV Questionnaire: No: Patient Medicare AWV questionnaire was not completed prior to this visit.  Cardiac Risk Factors include: advanced age (>26men, >42 women);dyslipidemia;diabetes mellitus;hypertension;Other (see comment), Risk factor comments: prostate cancer     Objective:    Today's Vitals   11/02/23 1350  Weight: 151 lb 12.8 oz (68.9 kg)  Height: 5' 5 (1.651 m)   Body mass index is 25.26 kg/m.     11/02/2023    2:24 PM 10/27/2022    3:29 PM 10/22/2021    9:40 AM 10/19/2020   10:35 AM 09/02/2019   11:39 AM 06/27/2019   11:03 AM 06/18/2018    8:31 AM  Advanced Directives  Does Patient Have a Medical Advance Directive? No Yes Yes Yes Yes Yes Yes   Type of Furniture conservator/restorer;Living will Healthcare Power of [place];Living will Healthcare Power of  [place];Living will Living will  Living will;Healthcare Power of Attorney  Does patient want to make changes to medical advance directive?  No - Patient declined No - Patient declined  No - Patient declined    Copy of Healthcare Power of Attorney in Chart?  No - copy requested No - copy requested No - copy requested     Would patient like information on creating a medical advance directive? Yes (MAU/Ambulatory/Procedural Areas - Information given)           Data saved with a previous flowsheet row definition    Current Medications (verified) Outpatient Encounter Medications as of 11/02/2023  Medication Sig   Accu-Chek Softclix Lancets lancets Use as instructed   acetaminophen  (TYLENOL ) 500 MG tablet Take 500 mg by mouth every 6 (six) hours as needed.   Ascorbic Acid (VITAMIN C) 1000 MG tablet Take 1,000 mg by mouth daily.   aspirin  81 MG chewable tablet Chew by mouth daily.   atorvastatin  (LIPITOR) 20 MG tablet TAKE 1 TABLET EVERY DAY   b complex vitamins tablet Take 1 tablet by mouth daily.   blood glucose meter kit and supplies Dispense based on patient and insurance preference. Use up to four times daily as directed. (FOR ICD-10 E10.9, E11.9).   cholecalciferol (VITAMIN D3) 25 MCG (1000 UNIT) tablet Take 1,000 Units by mouth daily.   Coenzyme Q10 (CO Q 10 PO) Take 50 mg by mouth daily.   docusate sodium  (COLACE) 100 MG capsule Take 1 capsule (100 mg total) by mouth every 12 (twelve) hours.   finasteride  (PROSCAR ) 5 MG tablet Take 5  mg by mouth daily.   furosemide  (LASIX ) 20 MG tablet TAKE ONE TABLET BY MOUTH EVERY OTHER DAY (NEED APPOINTMENT AND LABS)   gabapentin  (NEURONTIN ) 600 MG tablet TAKE ONE TABLET (600 MG TOTAL) BY MOUTH THREE TIMES DAILY AT 9AM, 3PM AND 9PM   glucose blood (ACCU-CHEK AVIVA PLUS) test strip 1 each by Other route daily. And lancets 1/day   hydrocortisone  (ANUSOL -HC) 2.5 % rectal cream Place 1 Application rectally 2 (two) times daily.   latanoprost  (XALATAN ) 0.005  % ophthalmic solution Place 1 drop into both eyes at bedtime.   metFORMIN  (GLUCOPHAGE -XR) 500 MG 24 hr tablet Take 1 tablet (500 mg total) by mouth in the morning and at bedtime.   mirtazapine  (REMERON ) 15 MG tablet TAKE ONE TABLET BY MOUTH DAILY AT 9 PM AT BEDTIME   omeprazole  (PRILOSEC) 20 MG capsule TAKE ONE CAPSULE BY MOUTH DAILY AT 9 AM   ramipril  (ALTACE ) 5 MG capsule TAKE ONE CAPSULE BY MOUTH DAILY AT 9 AM   repaglinide  (PRANDIN ) 0.5 MG tablet Take 1 tablet (0.5 mg total) by mouth daily before breakfast.   tamsulosin  (FLOMAX ) 0.4 MG CAPS capsule TAKE ONE CAPSULE BY MOUTH DAILY AT 7 PM   traMADol  (ULTRAM ) 50 MG tablet Take 1 tablet by mouth daily.   TRUE METRIX BLOOD GLUCOSE TEST test strip USE AS INSTRUCTED   No facility-administered encounter medications on file as of 11/02/2023.    Allergies (verified) Farxiga  [dapagliflozin ]   History: Past Medical History:  Diagnosis Date   Diabetes mellitus 1955   type 2 - fasting 100-120   Diverticulosis 2009   GERD (gastroesophageal reflux disease)    Glaucoma    bilateral   History of shingles    Hyperlipidemia    5 yrs   Hypertension    Neuromuscular disorder (HCC)    due to shingles around abdomen area    Osteoarthritis    knees (01/31/2014)   Prostate cancer (HCC)    no treatments; it's going away by itself w/the pills I take   Past Surgical History:  Procedure Laterality Date   ANTERIOR CERVICAL DECOMP/DISCECTOMY FUSION N/A 09/03/2015   Procedure: ANTERIOR CERVICAL DECOMPRESSION FUSION, CERVICAL 5-6 WITH INSTRUMENTATION AND ALLOGRAFT;  Surgeon: Oneil Priestly, MD;  Location: [facility] OR;  Service: Orthopedics;  Laterality: N/A;  ANTERIOR CERVICAL DECOMPRESSION FUSION, CERVICAL 5-6 WITH INSTRUMENTATION AND ALLOGRAFT   COLONOSCOPY     FINE NEEDLE ASPIRATION Left 06/10/15   Knee   FINE NEEDLE ASPIRATION  05/26/15   Back   JOINT REPLACEMENT     TONSILLECTOMY     TOTAL KNEE ARTHROPLASTY Right 01/31/2014   TOTAL KNEE ARTHROPLASTY  Right 01/31/2014   Procedure: RIGHT TOTAL KNEE ARTHROPLASTY;  Surgeon: Dempsey JINNY Sensor, MD;  Location: [facility] OR;  Service: Orthopedics;  Laterality: Right;   Family History  Problem Relation Age of Onset   Diabetes Mother    Heart disease Brother        questionable   Colon cancer Daughter 21   Arthritis Other    Diabetes Other    Cancer Other        prostate   Colon polyps Neg Hx    Esophageal cancer Neg Hx    Rectal cancer Neg Hx    Stomach cancer Neg Hx    Social History   Socioeconomic History   Marital status: Widowed    Spouse name: Not on file   Number of children: Not on file   Years of education: Not on file  Highest education level: 12th grade  Occupational History    Comment: retired Investment banker, operational     Comment: Psychiatrist  Tobacco Use   Smoking status: Former    Types: Cigars    Quit date: 04/12/1995    Years since quitting: 28.6   Smokeless tobacco: Never   Tobacco comments:    chews cigars still   Vaping Use   Vaping status: Never Used  Substance and Sexual Activity   Alcohol use: Yes    Comment: 1 glass wine monthly   Drug use: No   Sexual activity: Never  Other Topics Concern   Not on file  Social History Narrative   Works out in the garden   Social Drivers of Corporate investment banker Strain: Medium Risk (11/02/2023)   Overall Financial Resource Strain (CARDIA)    Difficulty of Paying Living Expenses: Somewhat hard  Food Insecurity: No Food Insecurity (11/02/2023)   Hunger Vital Sign    Worried About Running Out of Food in the Last Year: Never true    Ran Out of Food in the Last Year: Never true  Transportation Needs: No Transportation Needs (11/02/2023)   PRAPARE - Administrator, Civil Service (Medical): No    Lack of Transportation (Non-Medical): No  Physical Activity: Inactive (11/02/2023)   Exercise Vital Sign    Days of Exercise per Week: 0 days    Minutes of Exercise per Session: 0 min  Stress: No Stress Concern Present  (11/02/2023)   [employer] of Occupational Health - Occupational Stress Questionnaire    Feeling of Stress: Not at all  Social Connections: Moderately Integrated (11/02/2023)   Social Connection and Isolation Panel    Frequency of Communication with Friends and Family: More than three times a week    Frequency of Social Gatherings with Friends and Family: Once a week    Attends Religious Services: More than 4 times per year    Active Member of [employer] or Organizations: Yes    Attends Engineer, structural: More than 4 times per year    Marital Status: Divorced    Tobacco Counseling Counseling given: Not Answered Tobacco comments: chews cigars still     Clinical Intake:  Pre-visit preparation completed: Yes  Pain : No/denies pain     BMI - recorded: 25.2 Nutritional Status: BMI 25 -29 Overweight Diabetes: Yes CBG done?: No  Lab Results  Component Value Date   HGBA1C 7.5 (H) 10/17/2023   HGBA1C 7.0 (H) 03/23/2023   HGBA1C 7.1 (A) 04/25/2022     How often do you need to have someone help you when you read instructions, pamphlets, or other written materials from your doctor or pharmacy?: 1 - Never What is the last grade level you completed in school?: 12th grade  Interpreter Needed?: No  Information entered by :: Lolita Libra, CMA This is late documentation but questions were answered on 11/02/23.  Activities of Daily Living     11/02/2023    2:06 PM 10/17/2023    2:36 PM  In your present state of health, do you have any difficulty performing the following activities:  Hearing? 1 1  Comment bilateral hearing aids   Vision? 0 0  Difficulty concentrating or making decisions? 0 0  Walking or climbing stairs? 0 1  Dressing or bathing? 0 0  Doing errands, shopping? 0 0  Preparing Food and eating ? N   Using the Toilet? N   In the past six months, have  you accidently leaked urine? N   Do you have problems with loss of bowel control? N   Managing your  Medications? N   Managing your Finances? N   Housekeeping or managing your Housekeeping? N     Patient Care Team: Antonio Meth, Jamee SAUNDERS, DO as PCP - General (Family Medicine) Nieves Cough, MD as Consulting Physician (Urology) Cyrus Carwin, MD as Consulting Physician (Ophthalmology) Liam Lerner, MD as Consulting Physician (Orthopedic Surgery) Orlando Camellia MARLA DEVONNA as Physician Assistant (Orthopedic Surgery) Cesario Boer, MD as Attending Physician (Physical Medicine and Rehabilitation) Beuford Anes, MD as Consulting Physician (Orthopedic Surgery) [facility], Donell Cardinal, MD as Attending Physician (Endocrinology)  I have updated your Care Teams any recent Medical Services you may have received from other providers in the past year.     Assessment:   This is a routine wellness examination for [employer].  Hearing/Vision screen Hearing Screening - Comments:: Has bilateral hearing aids. Currently broken, awaiting repair Vision Screening - Comments:: Wears otc readers -- up to date with routine eye exams with Carwin Cyrus.    Goals Addressed   None    Depression Screen     11/02/2023    2:25 PM 10/17/2023    2:36 PM 10/27/2022    3:13 PM 07/28/2022   10:00 AM 10/22/2021    9:41 AM 10/19/2020   10:40 AM 09/02/2019   11:43 AM  PHQ 2/9 Scores  PHQ - 2 Score 0 0 0 0 0 0 0    Fall Risk     11/02/2023    1:57 PM 10/17/2023    2:36 PM 10/27/2022    3:08 PM 07/28/2022   10:00 AM 10/22/2021    9:40 AM  Fall Risk   Falls in the past year? 0 0 0 0 0  Number falls in past yr: 0 0 0 0 0  Injury with Fall? 0 0 0 0 0  Risk for fall due to : Impaired mobility  No Fall Risks Impaired balance/gait History of fall(s)  Risk for fall due to: Comment OA knees      Follow up Falls evaluation completed Falls evaluation completed Falls evaluation completed Falls evaluation completed Falls evaluation completed      Data saved with a previous flowsheet row definition    MEDICARE RISK AT HOME:   Medicare Risk at Home Any stairs in or around the home?: No Home free of loose throw rugs in walkways, pet beds, electrical cords, etc?: Yes Adequate lighting in your home to reduce risk of falls?: Yes Life alert?: Yes (wears when outside alone and travels) Use of a cane, walker or w/c?: Yes (uses walker occassionally.) Grab bars in the bathroom?: No Shower chair or bench in shower?: Yes Elevated toilet seat or a handicapped toilet?: Yes (comfort height)  TIMED UP AND GO:  Was the test performed?  No,audio  Cognitive Function: 6CIT completed    06/13/2016    8:55 AM  MMSE - Mini Mental State Exam  Orientation to time 5   Orientation to Place 5   Registration 3   Attention/ Calculation 5   Recall 3   Language- name 2 objects 2   Language- repeat 1  Language- follow 3 step command 3   Language- read & follow direction 1   Write a sentence 1   Copy design 1   Total score 30      Data saved with a previous flowsheet row definition        11/02/2023  2:10 PM 10/27/2022    3:13 PM 10/22/2021    9:53 AM  6CIT Screen  What Year? 0 points 4 points 0 points  What month? 0 points 3 points 0 points  What time? 0 points 0 points 0 points  Count back from 20 0 points 0 points 0 points  Months in reverse 2 points 2 points 4 points  Repeat phrase 4 points 10 points 6 points  Total Score 6 points 19 points 10 points    Immunizations Immunization History  Administered Date(s) Administered   Fluad Quad(high Dose 65+) 12/04/2018, 12/26/2019, 04/29/2021, 01/07/2022   Fluad Trivalent(High Dose 65+) 03/23/2023   Influenza Split 12/21/2011   Influenza Whole 12/19/2007, 01/16/2009, 01/27/2010   Influenza, High Dose Seasonal PF 12/15/2014, 12/24/2016, 02/21/2018   Influenza-Unspecified 01/01/2014, 12/24/2016   Moderna Sars-Covid-2 Vaccination 06/01/2019, 06/29/2019, 02/04/2020, 07/21/2020   Pneumococcal Conjugate-13 05/01/2014, 09/16/2014   Pneumococcal Polysaccharide-23 12/19/2007,  04/25/2019   Td 10/16/2007   Zoster Recombinant(Shingrix) 07/06/2016, 10/05/2016    Screening Tests Health Maintenance  Topic Date Due   DTaP/Tdap/Td (2 - Tdap) 10/15/2017   INFLUENZA VACCINE  11/10/2023   COVID-19 Vaccine (5 - 2024-25 season) 11/01/2024 (Originally 12/11/2022)   OPHTHALMOLOGY EXAM  03/21/2024   HEMOGLOBIN A1C  04/18/2024   FOOT EXAM  10/16/2024   Medicare Annual Wellness (AWV)  11/01/2024   Pneumococcal Vaccine: 50+ Years  Completed   Zoster Vaccines- Shingrix  Completed   Hepatitis B Vaccines  Aged Out   HPV VACCINES  Aged Out   Meningococcal B Vaccine  Aged Out    Health Maintenance  Health Maintenance Due  Topic Date Due   DTaP/Tdap/Td (2 - Tdap) 10/15/2017   INFLUENZA VACCINE  11/10/2023   Health Maintenance Items Addressed: Will get tetanus booster at pharmacy.  Additional Screening:  Vision Screening: Recommended annual ophthalmology exams for early detection of glaucoma and other disorders of the eye. Would you like a referral to an eye doctor? No    Dental Screening: Recommended annual dental exams for proper oral hygiene  Community Resource Referral / Chronic Care Management: CRR required this visit?  No   CCM required this visit?  No   Plan:    I have personally reviewed and noted the following in the patient's chart:   Medical and social history Use of alcohol, tobacco or illicit drugs  Current medications and supplements including opioid prescriptions. Patient is not currently taking opioid prescriptions. Functional ability and status Nutritional status Physical activity Advanced directives List of other physicians Hospitalizations, surgeries, and ER visits in previous 12 months Vitals Screenings to include cognitive, depression, and falls Referrals and appointments  In addition, I have reviewed and discussed with patient certain preventive protocols, quality metrics, and best practice recommendations. A written personalized  care plan for preventive services as well as general preventive health recommendations were provided to patient.   Lolita Libra, CMA   11/13/2023   After Visit Summary: (Mail) Due to this being a telephonic visit, the after visit summary with patients personalized plan was offered to patient via mail   Notes: Nothing significant to report at this time.

## 2023-11-13 NOTE — Progress Notes (Signed)
 Pharmacy Quality Measure Review  This patient is appearing on a report for being at risk of failing the adherence measure for hypertension (ACEi/ARB) medications this calendar year.   Medication: ramipril  Last fill date: 08/29/2023 for 30 day supply.  Patient is using [facility] for prescription - adherence packaging.   He actually did fill ramipril  for 30 day supply 10/18/2023 - he has 10 refills remaining.   Insurance report was not up to date. No action needed at this time regarding ramipril , however there are other medications that have not been filled in several months.  Patient has not answered calls from our office regarding last labs. I also noticed that his referral to urology for elevated PSA was cancelled due to patient request.   Attempt was made to contact patient by phone today for follow up by Clinical Pharmacist regarding medication adherence. I was not able to reach patient - no answer and voicemail has not been setup on his phone.   Patient was in to see PCP in July - see labs below. He has not filled any diabetes medications or lipid lowering medications in 2025.    Lab Results  Component Value Date   HGBA1C 7.5 (H) 10/17/2023   Lab Results  Component Value Date   CHOL 197 10/17/2023   HDL 42.70 10/17/2023   LDLCALC 123 (H) 10/17/2023   TRIG 153.0 (H) 10/17/2023   CHOLHDL 5 10/17/2023   Scr was 1.56 and eGFR was 39.8   Refill records show that he has not filled atorvastatin  20mg since 10/05/2021. It appears he uses adherence packages from [facility] - Will need to send updated Rx to [facility] if PCP wants him to restart atorvastatin  20mg   Also has not had finasteride  5mg , metformin  Er 500mg  twice a day or regalinide 0.5mg  filled since 03/02/23 -   If he were to start medication for diabetes -  Would like to use SGLT2 since he has CKD but he had adverse reaction with Farxiga  in past - cause pain and swelling of his penis.  Since eGFR is > 30 can restart metformin   but recommend lower dose of 500mg  ER once daily to start. Would hold off on repaglinide  to see if still needed since can cause hypoglycemia.    Any prescriptions started should be sent to [facility] to be included in his adherence packs.   Madelin Ray, PharmD Clinical Pharmacist [facility]

## 2023-11-16 DIAGNOSIS — M25561 Pain in right knee: Secondary | ICD-10-CM | POA: Diagnosis not present

## 2023-11-16 DIAGNOSIS — M170 Bilateral primary osteoarthritis of knee: Secondary | ICD-10-CM | POA: Diagnosis not present

## 2023-11-16 DIAGNOSIS — M25562 Pain in left knee: Secondary | ICD-10-CM | POA: Diagnosis not present

## 2023-11-16 DIAGNOSIS — G8929 Other chronic pain: Secondary | ICD-10-CM | POA: Diagnosis not present

## 2023-12-08 ENCOUNTER — Other Ambulatory Visit: Payer: Self-pay | Admitting: Family Medicine

## 2023-12-08 DIAGNOSIS — R634 Abnormal weight loss: Secondary | ICD-10-CM

## 2023-12-12 DIAGNOSIS — C009 Malignant neoplasm of lip, unspecified: Secondary | ICD-10-CM | POA: Diagnosis not present

## 2023-12-21 ENCOUNTER — Telehealth: Payer: Self-pay | Admitting: Family Medicine

## 2023-12-21 NOTE — Telephone Encounter (Signed)
 Pt showed up at office today, informed he will need to discuss questions with ortho.

## 2023-12-21 NOTE — Telephone Encounter (Signed)
 Copied from CRM #[id number]. Topic: General - Other >> Dec 20, 2023  3:22 PM Drema MATSU wrote: Reason for CRM: Patient stated that Dr. Penne his knee doctor tried to perform a procedure on him and he couldn't understand doctor. Patient had provider to write a note regarding the procedure he tried to do. Patient wants to drop note off for provider to look at and make decision. He will be dropping off tomorrow.

## 2024-02-06 DIAGNOSIS — M1712 Unilateral primary osteoarthritis, left knee: Secondary | ICD-10-CM | POA: Diagnosis not present

## 2024-02-06 DIAGNOSIS — G8929 Other chronic pain: Secondary | ICD-10-CM | POA: Diagnosis not present

## 2024-02-19 ENCOUNTER — Other Ambulatory Visit: Payer: Self-pay | Admitting: Family Medicine

## 2024-02-20 ENCOUNTER — Other Ambulatory Visit: Payer: Self-pay | Admitting: Family Medicine

## 2024-02-20 ENCOUNTER — Ambulatory Visit: Payer: Self-pay

## 2024-02-20 NOTE — Telephone Encounter (Signed)
 Error. Wrong office. Dr. Sam prescribes metformin .

## 2024-02-20 NOTE — Telephone Encounter (Signed)
  FYI Only or Action Required?: Action required by provider: medication refill request.  Patient was last seen in primary care on 10/17/2023 by Antonio Meth, Jamee SAUNDERS, DO.  Called Nurse Triage reporting Medication Refill.  .  Triage Disposition: Call PCP Now  Patient/caregiver understands and will follow disposition?: Yes       Copied from CRM [id number]. Topic: Clinical - Red Word Triage >> Feb 20, 2024  1:21 PM Jasmin G wrote: [facility] that prompted transfer to Nurse Triage: Pt's nurse called to check if pt is supposed to keep taking metFORMIN  (GLUCOPHAGE -XR) 500 MG 24 hr tablet, I informed her that I was unsure as prescription is still active but a refill has not been issued recently, she requested to speak no NT regarding this. Reason for Disposition  [1] Prescription refill request for ESSENTIAL medicine (i.e., likelihood of harm to patient if not taken) AND [2] triager unable to refill per department policy  Answer Assessment - Initial Assessment Questions This RN spoke with pt and pt's nurse regarding his prescription. Requesting a refill and a call back regarding this request.   1. DRUG NAME: What medicine do you need to have refilled?     metFORMIN  (GLUCOPHAGE -XR) 500 MG 24 hr tablet 2. REFILLS REMAINING: How many refills are remaining? Notes: The label on the medicine or pill bottle will show how many refills are remaining. If there are no refills remaining, then a renewal may be needed.     0 3. PRESCRIBER: Who prescribed it? Note: The prescribing doctor or group is responsible for refill approvals.SABRA     PCP 4. PHARMACY: Have you contacted your pharmacy (drugstore)? Note: Some pharmacies will contact the doctor (or NP/PA).      Yes  Protocols used: Medication Refill and Renewal Call-A-AH

## 2024-02-24 ENCOUNTER — Other Ambulatory Visit: Payer: Self-pay | Admitting: Family Medicine

## 2024-02-24 DIAGNOSIS — E1149 Type 2 diabetes mellitus with other diabetic neurological complication: Secondary | ICD-10-CM

## 2024-02-28 DIAGNOSIS — H401131 Primary open-angle glaucoma, bilateral, mild stage: Secondary | ICD-10-CM | POA: Diagnosis not present

## 2024-02-28 DIAGNOSIS — E119 Type 2 diabetes mellitus without complications: Secondary | ICD-10-CM | POA: Diagnosis not present

## 2024-02-28 DIAGNOSIS — H2513 Age-related nuclear cataract, bilateral: Secondary | ICD-10-CM | POA: Diagnosis not present

## 2024-02-29 ENCOUNTER — Other Ambulatory Visit: Payer: Self-pay | Admitting: Family Medicine

## 2024-02-29 DIAGNOSIS — E1149 Type 2 diabetes mellitus with other diabetic neurological complication: Secondary | ICD-10-CM

## 2024-02-29 NOTE — Telephone Encounter (Signed)
 Rx was sent on 02/26/24

## 2024-02-29 NOTE — Telephone Encounter (Signed)
 Copied from CRM [id number]. Topic: Clinical - Medication Refill >> Feb 29, 2024 10:21 AM Thersia BROCKS wrote: Medication: gabapentin  (NEURONTIN ) 600 MG tablet  Has the patient contacted their pharmacy? Yes (Agent: If no, request that the patient contact the pharmacy for the refill. If patient does not wish to contact the pharmacy document the reason why and proceed with request.) (Agent: If yes, when and what did the pharmacy advise?)  This is the patient's preferred pharmacy:   [facility] - [place], [place] - [address] [place] [address] Phone: [phone] Fax: [phone]  Is this the correct pharmacy for this prescription? Yes If no, delete pharmacy and type the correct one.   Has the prescription been filled recently? No  Is the patient out of the medication? Yes  Has the patient been seen for an appointment in the last year OR does the patient have an upcoming appointment? Yes  Can we respond through MyChart? Yes  Agent: Please be advised that Rx refills may take up to 3 business days. We ask that you follow-up with your pharmacy.

## 2024-04-19 ENCOUNTER — Ambulatory Visit: Admitting: Family Medicine

## 2024-05-13 ENCOUNTER — Telehealth: Payer: Self-pay | Admitting: Pharmacist

## 2024-05-13 DIAGNOSIS — I10 Essential (primary) hypertension: Secondary | ICD-10-CM

## 2024-05-13 MED ORDER — ATORVASTATIN CALCIUM 20 MG PO TABS: 20.0000 mg | ORAL_TABLET | ORAL | 0 refills | Status: AC

## 2024-05-13 NOTE — Telephone Encounter (Cosign Needed)
 Patient was to see PCP 04/19/2024 but he missed appointment. Called patient today to rescheduled. He states he was not aware of the appointment on 04/19/2024.  Rescheduled for him to see Dr Cruz for 06/03/2024 at 1:20pm. Patient read back date and time of appointment to me.   Patient has not filled repaglinide  or metformin  in over a year. See last A1c below. Was not too bad considering he has not taken blood glucose lowering medications recently.    Lab Results  Component Value Date   HGBA1C 7.5 (H) 10/17/2023   His LDL was elevated when last check. Has not filled atorvastatin  in over a year.    Lab Results  Component Value Date   CHOL 197 10/17/2023   HDL 42.70 10/17/2023   LDLCALC 123 (H) 10/17/2023   TRIG 153.0 (H) 10/17/2023   CHOLHDL 5 10/17/2023   Updated Rx for atorvastatin  for 30 days - sent to [facility].   Madelin Ray, PharmD Clinical Pharmacist [facility]

## 2024-05-17 ENCOUNTER — Other Ambulatory Visit: Payer: Self-pay | Admitting: Family Medicine

## 2024-05-17 DIAGNOSIS — R634 Abnormal weight loss: Secondary | ICD-10-CM

## 2024-05-17 DIAGNOSIS — I10 Essential (primary) hypertension: Secondary | ICD-10-CM

## 2024-06-03 ENCOUNTER — Ambulatory Visit: Admitting: Family Medicine

## 2024-06-10 ENCOUNTER — Other Ambulatory Visit

## 2024-07-31 ENCOUNTER — Ambulatory Visit: Admitting: Internal Medicine

## 2024-11-05 ENCOUNTER — Ambulatory Visit
